# Patient Record
Sex: Female | Born: 1965 | Hispanic: No | Marital: Single | Smoking: Never smoker
Health system: Southern US, Community
[De-identification: ages and names within clinical notes are randomized; demographics above are authoritative.]

## PROBLEM LIST (undated history)

## (undated) DIAGNOSIS — I10 Essential (primary) hypertension: Secondary | ICD-10-CM

## (undated) DIAGNOSIS — E119 Type 2 diabetes mellitus without complications: Secondary | ICD-10-CM

## (undated) DIAGNOSIS — E785 Hyperlipidemia, unspecified: Secondary | ICD-10-CM

## (undated) DIAGNOSIS — E790 Hyperuricemia without signs of inflammatory arthritis and tophaceous disease: Secondary | ICD-10-CM

## (undated) HISTORY — DX: Hyperuricemia without signs of inflammatory arthritis and tophaceous disease: E79.0

## (undated) HISTORY — PX: OTHER SURGICAL HISTORY: SHX169

## (undated) HISTORY — DX: Hyperlipidemia, unspecified: E78.5

---

## 2004-12-01 HISTORY — PX: BONE MARROW BIOPSY: SHX199

## 2005-02-25 ENCOUNTER — Encounter: Payer: Self-pay | Admitting: Internal Medicine

## 2007-03-01 ENCOUNTER — Inpatient Hospital Stay (HOSPITAL_COMMUNITY): Admission: AD | Admit: 2007-03-01 | Discharge: 2007-03-01 | Payer: Self-pay | Admitting: Obstetrics & Gynecology

## 2007-03-15 ENCOUNTER — Encounter: Admission: RE | Admit: 2007-03-15 | Discharge: 2007-03-15 | Payer: Self-pay | Admitting: Obstetrics and Gynecology

## 2007-03-18 ENCOUNTER — Ambulatory Visit: Payer: Self-pay | Admitting: Internal Medicine

## 2007-08-10 ENCOUNTER — Ambulatory Visit: Payer: Self-pay | Admitting: Internal Medicine

## 2007-08-19 ENCOUNTER — Encounter (INDEPENDENT_AMBULATORY_CARE_PROVIDER_SITE_OTHER): Payer: Self-pay | Admitting: *Deleted

## 2007-08-26 ENCOUNTER — Ambulatory Visit: Payer: Self-pay | Admitting: Internal Medicine

## 2007-08-26 LAB — CONVERTED CEMR LAB
Cholesterol, target level: 200 mg/dL
HDL goal, serum: 40 mg/dL
LDL Goal: 160 mg/dL

## 2008-01-05 ENCOUNTER — Ambulatory Visit: Payer: Self-pay | Admitting: Internal Medicine

## 2008-01-19 ENCOUNTER — Encounter: Payer: Self-pay | Admitting: Internal Medicine

## 2008-02-28 ENCOUNTER — Ambulatory Visit: Payer: Self-pay | Admitting: Internal Medicine

## 2008-03-05 LAB — CONVERTED CEMR LAB
Cholesterol: 208 mg/dL (ref 0–200)
Direct LDL: 153.9 mg/dL
HDL: 32.9 mg/dL — ABNORMAL LOW (ref >39.0)
Hgb A1c MFr Bld: 5.9 % (ref 4.6–6.0)
Total CHOL/HDL Ratio: 6.3
Triglycerides: 193 mg/dL — ABNORMAL HIGH (ref 0–149)
VLDL: 39 mg/dL (ref 0–40)

## 2008-03-06 ENCOUNTER — Encounter (INDEPENDENT_AMBULATORY_CARE_PROVIDER_SITE_OTHER): Payer: Self-pay | Admitting: *Deleted

## 2008-03-06 ENCOUNTER — Ambulatory Visit: Payer: Self-pay | Admitting: Internal Medicine

## 2008-03-06 DIAGNOSIS — E1169 Type 2 diabetes mellitus with other specified complication: Secondary | ICD-10-CM | POA: Insufficient documentation

## 2008-03-06 DIAGNOSIS — E782 Mixed hyperlipidemia: Secondary | ICD-10-CM | POA: Insufficient documentation

## 2008-03-06 LAB — CONVERTED CEMR LAB: LDL Goal: 130 mg/dL

## 2008-04-19 ENCOUNTER — Encounter: Payer: Self-pay | Admitting: Internal Medicine

## 2008-06-05 ENCOUNTER — Ambulatory Visit: Payer: Self-pay | Admitting: Internal Medicine

## 2008-06-05 DIAGNOSIS — T887XXA Unspecified adverse effect of drug or medicament, initial encounter: Secondary | ICD-10-CM | POA: Insufficient documentation

## 2008-06-05 LAB — CONVERTED CEMR LAB
ALT: 19 units/L (ref 0–35)
AST: 22 units/L (ref 0–37)
Albumin: 3.8 g/dL (ref 3.5–5.2)
Alkaline Phosphatase: 66 units/L (ref 39–117)
Bilirubin, Direct: 0.1 mg/dL (ref 0.0–0.3)
Cholesterol: 207 mg/dL (ref 0–200)
Direct LDL: 145.6 mg/dL
Free T4: 1 ng/dL (ref 0.6–1.6)
HDL: 31.8 mg/dL — ABNORMAL LOW (ref >39.0)
TSH: 2.07 microintl units/mL (ref 0.35–5.50)
Total Bilirubin: 0.7 mg/dL (ref 0.3–1.2)
Total CHOL/HDL Ratio: 6.5
Total Protein: 7.7 g/dL (ref 6.0–8.3)
Triglycerides: 241 mg/dL (ref 0–149)
VLDL: 48 mg/dL — ABNORMAL HIGH (ref 0–40)

## 2008-06-08 ENCOUNTER — Ambulatory Visit: Payer: Self-pay | Admitting: Internal Medicine

## 2008-06-08 LAB — CONVERTED CEMR LAB
Cholesterol, target level: 200 mg/dL
HDL goal, serum: 50 mg/dL
LDL Goal: 100 mg/dL

## 2008-09-11 ENCOUNTER — Ambulatory Visit: Payer: Self-pay | Admitting: Internal Medicine

## 2008-09-14 ENCOUNTER — Encounter (INDEPENDENT_AMBULATORY_CARE_PROVIDER_SITE_OTHER): Payer: Self-pay | Admitting: *Deleted

## 2008-09-14 LAB — CONVERTED CEMR LAB
ALT: 19 units/L (ref 0–35)
AST: 21 units/L (ref 0–37)
Albumin: 3.8 g/dL (ref 3.5–5.2)
Alkaline Phosphatase: 65 units/L (ref 39–117)
Bilirubin, Direct: 0.1 mg/dL (ref 0.0–0.3)
Cholesterol: 228 mg/dL (ref 0–200)
Direct LDL: 144.5 mg/dL
HDL: 35.5 mg/dL — ABNORMAL LOW (ref >39.0)
Total Bilirubin: 0.8 mg/dL (ref 0.3–1.2)
Total CHOL/HDL Ratio: 6.4
Total Protein: 8 g/dL (ref 6.0–8.3)
Triglycerides: 223 mg/dL (ref 0–149)
VLDL: 45 mg/dL — ABNORMAL HIGH (ref 0–40)

## 2008-09-18 ENCOUNTER — Ambulatory Visit: Payer: Self-pay | Admitting: Internal Medicine

## 2008-09-18 LAB — CONVERTED CEMR LAB
Cholesterol, target level: 200 mg/dL
HDL goal, serum: 50 mg/dL
LDL Goal: 100 mg/dL

## 2008-11-07 ENCOUNTER — Encounter: Payer: Self-pay | Admitting: Internal Medicine

## 2008-11-07 ENCOUNTER — Encounter: Admission: RE | Admit: 2008-11-07 | Discharge: 2008-11-07 | Payer: Self-pay | Admitting: Internal Medicine

## 2008-12-18 ENCOUNTER — Ambulatory Visit: Payer: Self-pay | Admitting: Internal Medicine

## 2008-12-24 LAB — CONVERTED CEMR LAB
ALT: 23 units/L (ref 0–35)
AST: 20 units/L (ref 0–37)
Albumin: 3.9 g/dL (ref 3.5–5.2)
Alkaline Phosphatase: 64 units/L (ref 39–117)
Bilirubin, Direct: 0.1 mg/dL (ref 0.0–0.3)
Cholesterol: 155 mg/dL (ref 0–200)
HDL: 33.4 mg/dL — ABNORMAL LOW (ref >39.0)
Hgb A1c MFr Bld: 5.8 % (ref 4.6–6.0)
LDL Cholesterol: 91 mg/dL (ref 0–99)
Total Bilirubin: 0.6 mg/dL (ref 0.3–1.2)
Total CHOL/HDL Ratio: 4.6
Total Protein: 7.5 g/dL (ref 6.0–8.3)
Triglycerides: 152 mg/dL — ABNORMAL HIGH (ref 0–149)
VLDL: 30 mg/dL (ref 0–40)

## 2008-12-25 ENCOUNTER — Ambulatory Visit: Payer: Self-pay | Admitting: Internal Medicine

## 2008-12-25 LAB — CONVERTED CEMR LAB
Cholesterol, target level: 200 mg/dL
HDL goal, serum: 40 mg/dL
LDL Goal: 100 mg/dL

## 2008-12-29 LAB — CONVERTED CEMR LAB
Free T4: 1 ng/dL (ref 0.6–1.6)
TSH: 1.78 microintl units/mL (ref 0.35–5.50)

## 2009-01-01 ENCOUNTER — Encounter: Payer: Self-pay | Admitting: Internal Medicine

## 2009-01-09 ENCOUNTER — Encounter: Payer: Self-pay | Admitting: Internal Medicine

## 2009-05-10 ENCOUNTER — Encounter: Admission: RE | Admit: 2009-05-10 | Discharge: 2009-05-10 | Payer: Self-pay | Admitting: Obstetrics and Gynecology

## 2009-05-16 ENCOUNTER — Encounter: Admission: RE | Admit: 2009-05-16 | Discharge: 2009-05-16 | Payer: Self-pay | Admitting: Obstetrics and Gynecology

## 2009-06-19 ENCOUNTER — Ambulatory Visit: Payer: Self-pay | Admitting: Internal Medicine

## 2009-06-22 LAB — CONVERTED CEMR LAB
Rhuematoid fact SerPl-aCnc: <20 intl units/mL (ref 0.0–20.0)
Sed Rate: 25 mm/hr — ABNORMAL HIGH (ref 0–22)
Total CK: 100 units/L (ref 7–177)
Uric Acid, Serum: 7.1 mg/dL — ABNORMAL HIGH (ref 2.4–7.0)
Vit D, 25-Hydroxy: 16 ng/mL — ABNORMAL LOW (ref 30–89)

## 2009-06-26 ENCOUNTER — Encounter (INDEPENDENT_AMBULATORY_CARE_PROVIDER_SITE_OTHER): Payer: Self-pay | Admitting: *Deleted

## 2009-10-30 ENCOUNTER — Ambulatory Visit: Payer: Self-pay | Admitting: Internal Medicine

## 2009-11-07 ENCOUNTER — Encounter (INDEPENDENT_AMBULATORY_CARE_PROVIDER_SITE_OTHER): Payer: Self-pay | Admitting: *Deleted

## 2009-11-07 LAB — CONVERTED CEMR LAB: Vit D, 25-Hydroxy: 30 ng/mL (ref 30–89)

## 2009-12-24 ENCOUNTER — Ambulatory Visit: Payer: Self-pay | Admitting: Internal Medicine

## 2009-12-24 LAB — CONVERTED CEMR LAB
ALT: 23 units/L (ref 0–35)
AST: 24 units/L (ref 0–37)
Albumin: 4.1 g/dL (ref 3.5–5.2)
Alkaline Phosphatase: 63 units/L (ref 39–117)
BUN: 9 mg/dL (ref 6–23)
Basophils Absolute: 0.1 10*3/uL (ref 0.0–0.1)
Basophils Relative: 0.8 % (ref 0.0–3.0)
Bilirubin Urine: NEGATIVE
Bilirubin, Direct: 0.1 mg/dL (ref 0.0–0.3)
Blood in Urine, dipstick: NEGATIVE
CO2: 26 meq/L (ref 19–32)
Calcium: 9.1 mg/dL (ref 8.4–10.5)
Chloride: 104 meq/L (ref 96–112)
Cholesterol: 215 mg/dL — ABNORMAL HIGH (ref 0–200)
Creatinine, Ser: 0.7 mg/dL (ref 0.4–1.2)
Direct LDL: 159.7 mg/dL
Eosinophils Absolute: 0.2 10*3/uL (ref 0.0–0.7)
Eosinophils Relative: 2.1 % (ref 0.0–5.0)
GFR calc non Af Amer: 96.18 mL/min (ref >60)
Glucose, Bld: 90 mg/dL (ref 70–99)
Glucose, Urine, Semiquant: NEGATIVE
HCT: 43 % (ref 36.0–46.0)
HDL: 38.2 mg/dL — ABNORMAL LOW (ref >39.00)
Hemoglobin: 14.2 g/dL (ref 12.0–15.0)
Ketones, urine, test strip: NEGATIVE
Lymphocytes Relative: 30.6 % (ref 12.0–46.0)
Lymphs Abs: 3.1 10*3/uL (ref 0.7–4.0)
MCHC: 32.9 g/dL (ref 30.0–36.0)
MCV: 89.3 fL (ref 78.0–100.0)
Monocytes Absolute: 0.5 10*3/uL (ref 0.1–1.0)
Monocytes Relative: 4.8 % (ref 3.0–12.0)
Neutro Abs: 6.1 10*3/uL (ref 1.4–7.7)
Neutrophils Relative %: 61.7 % (ref 43.0–77.0)
Nitrite: NEGATIVE
Platelets: 336 10*3/uL (ref 150.0–400.0)
Potassium: 4.2 meq/L (ref 3.5–5.1)
Protein, U semiquant: NEGATIVE
RBC: 4.81 M/uL (ref 3.87–5.11)
RDW: 12.1 % (ref 11.5–14.6)
Sodium: 139 meq/L (ref 135–145)
Specific Gravity, Urine: <1.005
TSH: 1.73 microintl units/mL (ref 0.35–5.50)
Total Bilirubin: 0.8 mg/dL (ref 0.3–1.2)
Total CHOL/HDL Ratio: 6
Total Protein: 7.8 g/dL (ref 6.0–8.3)
Triglycerides: 248 mg/dL — ABNORMAL HIGH (ref 0.0–149.0)
Urobilinogen, UA: 0.2
VLDL: 49.6 mg/dL — ABNORMAL HIGH (ref 0.0–40.0)
WBC Urine, dipstick: NEGATIVE
WBC: 10 10*3/uL (ref 4.5–10.5)
pH: 7

## 2009-12-27 ENCOUNTER — Ambulatory Visit: Payer: Self-pay | Admitting: Internal Medicine

## 2010-05-14 ENCOUNTER — Encounter: Admission: RE | Admit: 2010-05-14 | Discharge: 2010-05-14 | Payer: Self-pay | Admitting: Obstetrics and Gynecology

## 2010-06-26 ENCOUNTER — Ambulatory Visit: Payer: Self-pay | Admitting: Internal Medicine

## 2010-06-27 ENCOUNTER — Encounter: Payer: Self-pay | Admitting: Internal Medicine

## 2010-10-22 ENCOUNTER — Ambulatory Visit: Payer: Self-pay | Admitting: Internal Medicine

## 2010-10-22 DIAGNOSIS — J019 Acute sinusitis, unspecified: Secondary | ICD-10-CM | POA: Insufficient documentation

## 2010-12-30 ENCOUNTER — Ambulatory Visit
Admission: RE | Admit: 2010-12-30 | Discharge: 2010-12-30 | Payer: Self-pay | Source: Home / Self Care | Attending: Internal Medicine | Admitting: Internal Medicine

## 2010-12-30 ENCOUNTER — Other Ambulatory Visit: Payer: Self-pay | Admitting: Internal Medicine

## 2010-12-30 LAB — LIPID PANEL
Cholesterol: 206 mg/dL — ABNORMAL HIGH (ref 0–200)
HDL: 37.4 mg/dL — ABNORMAL LOW (ref >39.00)
Total CHOL/HDL Ratio: 6
Triglycerides: 174 mg/dL — ABNORMAL HIGH (ref 0.0–149.0)
VLDL: 34.8 mg/dL (ref 0.0–40.0)

## 2010-12-30 LAB — HEPATIC FUNCTION PANEL
ALT: 20 U/L (ref 0–35)
AST: 19 U/L (ref 0–37)
Albumin: 3.8 g/dL (ref 3.5–5.2)
Alkaline Phosphatase: 63 U/L (ref 39–117)
Bilirubin, Direct: 0 mg/dL (ref 0.0–0.3)
Total Bilirubin: 0.5 mg/dL (ref 0.3–1.2)
Total Protein: 6.9 g/dL (ref 6.0–8.3)

## 2010-12-30 LAB — LDL CHOLESTEROL, DIRECT: Direct LDL: 154.7 mg/dL

## 2010-12-31 NOTE — Letter (Signed)
Summary: Results Follow up Letter  [place] at [facility]  [address] [place], [place] [address]   Phone: [phone]  Fax: [phone]    06/26/2009 MRN: [id number]  Savannah Rogers [address] [place], [place]  [address]  Dear Ms. Maret,  The following are the results of your recent test(s):  Test         Result    Pap Smear:        Normal _____  Not Normal _____ Comments: ______________________________________________________ Cholesterol: LDL(Bad cholesterol):         Your goal is less than:         HDL (Good cholesterol):       Your goal is more than: Comments:  ______________________________________________________ Mammogram:        Normal _____  Not Normal _____ Comments:  ___________________________________________________________________ Hemoccult:        Normal _____  Not normal _______ Comments:    _____________________________________________________________________ Other Tests: PLEASE SEE ATTACHED LABS DONE ON 06/19/2009 AND RX. ATTACHED IS ALSO AN APPOINTMENT CARD TO RECHECK IN 4 MONTHS    We routinely do not discuss normal results over the telephone.  If you desire a copy of the results, or you have any questions about this information we can discuss them at your next office visit.   Sincerely,

## 2010-12-31 NOTE — Letter (Signed)
Summary: Results Follow up Letter  [place] at [facility]  [address] [place], [place] [address]   Phone: [phone]  Fax: [phone]    01/01/2009 MRN: [id number]  Savannah Rogers [address] [place], [place]  [address]  Dear Ms. Bergevin,  The following are the results of your recent test(s):  Test         Result    Pap Smear:        Normal _____  Not Normal _____ Comments: ______________________________________________________ Cholesterol: LDL(Bad cholesterol):         Your goal is less than:         HDL (Good cholesterol):       Your goal is more than: Comments:  ______________________________________________________ Mammogram:        Normal _____  Not Normal _____ Comments:  ___________________________________________________________________ Hemoccult:        Normal _____  Not normal _______ Comments:    _____________________________________________________________________ Other Tests: PLEASE SEE ATTACHED LABS DONE ON 12/25/2008    We routinely do not discuss normal results over the telephone.  If you desire a copy of the results, or you have any questions about this information we can discuss them at your next office visit.   Sincerely,

## 2010-12-31 NOTE — Assessment & Plan Note (Signed)
Summary: roa 4 months review  lab/cbs   Vital Signs:  Patient Profile:   45 Years Old Female Weight:      147.8 pounds Pulse rate:   72 / minute BP sitting:   122 / 84  (left arm) Cuff size:   large  Vitals Entered By: Shonna Chock (March 06, 2008 3:20 PM)                 Chief Complaint:  FOLLOW-UP and DISCUSS LABS.  History of Present Illness: Lipids & NMR reviewed ; LDL > 100 is only risk . Risk is > 20% (goal = < 10%,ideally < 5%0. She decreased bread intake; CVE once daily for 1 hour  w/o symptoms.  Lipid Management History:      Positive NCEP/ATP III risk factors include HDL cholesterol less than 40 and family history for ischemic heart disease (females less than 6 years old).  Negative NCEP/ATP III risk factors include female age less than 60 years old, non-diabetic, non-tobacco-user status, non-hypertensive, no ASHD (atherosclerotic heart disease), no prior stroke/TIA, no peripheral vascular disease, and no history of aortic aneurysm.       Current Allergies: No known allergies      Review of Systems  CV      Denies chest pain or discomfort, leg cramps with exertion, and shortness of breath with exertion.   Physical Exam  General:     Well-developed,well-nourished,in no acute distress; alert,appropriate and cooperative throughout examination Heart:     Normal rate and regular rhythm. S1 and S2 normal without gallop, murmur, click, rub or other extra sounds. Pulses:     R and L carotid,radial,dorsalis pedis and posterior tibial pulses are full and equal bilaterally Psych:     Focused & motivated    Impression & Recommendations:  Problem # 1:  HYPERLIPIDEMIA (ICD-272.4)  Her updated medication list for this problem includes:    Pravastatin Sodium 40 Mg Tabs (Pravastatin sodium) .Marland Kitchen... 1 qhs   Complete Medication List: 1)  Claritin 10 Mg Tabs (Loratadine) .Marland Kitchen.. 1 once daily as needed allergies 2)  Pravastatin Sodium 40 Mg Tabs (Pravastatin sodium)  .Marland Kitchen.. 1 qhs  Lipid Assessment/Plan:      Based on NCEP/ATP III, the patient's risk factor category is "2 or more risk factors and a calculated 10 year CAD risk of < 20%".  From this information, the patient's calculated lipid goals are as follows: Total cholesterol goal is 200; LDL cholesterol goal is 130; HDL cholesterol goal is 40; Triglyceride goal is 150.  Her LDL cholesterol goal has not been met.  Secondary causes for hyperlipidemia have been ruled out.  She has been counseled on adjunctive measures for lowering her cholesterol and has been provided with dietary instructions.     Patient Instructions: 1)  Please schedule a follow-up appointment in 3 months. 2)  Hepatic Panel prior to visit, ICD-9: 995.20 3)  Lipid Panel prior to visit, ICD-9:272.4. Your LDL goal = < 100 (NOT < 130)    Prescriptions: PRAVASTATIN SODIUM 40 MG  TABS (PRAVASTATIN SODIUM) 1 qhs  #90 x 0   Entered and Authorized by:   Marga Melnick MD   Signed by:   Marga Melnick MD on 03/06/2008   Method used:   Print then Give to Patient   RxID:   [id number]  ]

## 2010-12-31 NOTE — Assessment & Plan Note (Signed)
Summary: to discuss the letter she got   ph   Vital Signs:  Patient Profile:   45 Years Old Female Weight:      142 pounds Pulse rate:   72 / minute Pulse rhythm:   regular BP sitting:   142 / 80  (left arm) Cuff size:   large  Pt. in pain?   no  Vitals Entered By: Wendall Stade (August 26, 2007 12:45 PM)                  Chief Complaint:  follow up labs.  History of Present Illness: NMR revealed LDL 156 with 2239 total # & 1292 small dense # for a risk of > 20%. LDL goal = < 100. Exercising treadmill & body exercises 45 min 3X/week without cardiopulmonary symptoms. No specific diet.  Lipid Management History:      Positive NCEP/ATP III risk factors include family history for ischemic heart disease (females less than 25 years old).  Negative NCEP/ATP III risk factors include female age less than 43 years old, non-diabetic, non-tobacco-user status, non-hypertensive, no ASHD (atherosclerotic heart disease), no prior stroke/TIA, no peripheral vascular disease, and no history of aortic aneurysm.     Current Allergies (reviewed today): No known allergies  Updated/Current Medications (including changes made in today's visit):  None     Risk Factors:  Tobacco use:  never  Family History Risk Factors:    Family History of MI in females < 79 years old:  yes    Family History of MI in males < 84 years old:  no    Physical Exam  General:     Well-developed,well-nourished,in no acute distress; alert,appropriate and cooperative throughout examination Heart:     Normal rate and regular rhythm. S1 and S2 normal without gallop, murmur, click, rub or other extra sounds. Pulses:     R and L carotid,radial,dorsalis pedis and posterior tibial pulses are full and equal bilaterally Psych:     Intelligent & focused    Impression & Recommendations:  Problem # 1:  HYPERLIPIDEMIA (ICD-272.2)  Lipid Assessment/Plan:      Based on NCEP/ATP III, the patient's risk factor  category is "0-1 risk factors".  From this information, the patient's calculated lipid goals are as follows: Total cholesterol goal is 200; LDL cholesterol goal is 160; HDL cholesterol goal is 40; Triglyceride goal is 150.  Her LDL cholesterol goal has not been met.  Secondary causes for hyperlipidemia have been ruled out.  She has been counseled on adjunctive measures for lowering her cholesterol and has been provided with dietary instructions.     Patient Instructions: 1)  Please schedule a follow-up appointment in 4 months. Low carb diet of choice. 2)  Lipid Panel prior to visit, ICD-9: 272.4 3)  HbgA1C prior to visit, ICD-9: 277.7. BP goal = average < 130/85.    ]

## 2010-12-31 NOTE — Assessment & Plan Note (Signed)
Summary: review labs   Vital Signs:  Patient Profile:   45 Years Old Female Weight:      147.6 pounds Pulse rate:   84 / minute Resp:     14 per minute BP sitting:   118 / 72  (left arm) Cuff size:   large  Vitals Entered By: Shonna Chock (September 18, 2008 3:20 PM)                 Chief Complaint:  FOLLOW-UP ON LABS AND FLU VACCINE.  History of Present Illness: Compared to 06/05/08 TG minimally better; LDL unchanged. CVE as 45 min treadmill 3X/week w/o symptoms. Diet has increased rice & bread.  Lipid Management History:      Positive NCEP/ATP III risk factors include HDL cholesterol less than 40.  Negative NCEP/ATP III risk factors include female age less than 4 years old, non-diabetic, no family history for ischemic heart disease, non-tobacco-user status, non-hypertensive, no ASHD (atherosclerotic heart disease), no prior stroke/TIA, no peripheral vascular disease, and no history of aortic aneurysm.       Current Allergies (reviewed today): No known allergies      Review of Systems  CV      Denies chest pain or discomfort, leg cramps with exertion, and shortness of breath with exertion.   Physical Exam  General:     well-nourished,in no acute distress; alert,appropriate and cooperative throughout examination Heart:     normal rate, regular rhythm, no gallop, no rub, no JVD, and grade 1/2 /6 systolic murmur.   Pulses:     R and L carotid,radial,dorsalis pedis and posterior tibial pulses are full and equal bilaterally    Impression & Recommendations:  Problem # 1:  HYPERLIPIDEMIA (ICD-272.4)  Orders: Nutrition Referral (Nutrition)   Complete Medication List: 1)  Claritin 10 Mg Tabs (Loratadine) .Marland Kitchen.. 1 once daily as needed allergies 2)  Crestor 20 Mg Tabs (rosuvastatin Calcium)  .Marland Kitchen.. 1 mon,weds & fri 3)  Singulair 10 Mg Tabs (Montelukast sodium) .Marland Kitchen.. 1 once daily for cough  Lipid Assessment/Plan:      Based on NCEP/ATP III, the patient's risk factor  category is "0-1 risk factors".  The patient's lipid goals have been set as follows: Total cholesterol goal is 200; LDL cholesterol goal is 100; HDL cholesterol goal is 50; Triglyceride goal is 150.  Her LDL cholesterol goal has not been met.  Secondary causes for hyperlipidemia have been ruled out.  She has been counseled on adjunctive measures for lowering her cholesterol and has been provided with dietary instructions.     Patient Instructions: 1)  Read The Sugar Busters for information  on glycemic index of foods. 2)  Please schedule a follow-up appointment in 3 months. 3)  Hepatic Panel prior to visit, ICD-9: 995.20 4)  Lipid Panel prior to visit, ICD-9:272.4 5)  HbgA1C prior to visit, ICD-9:790.6   Prescriptions: CRESTOR 20 MG  TABS (ROSUVASTATIN CALCIUM) 1 Mon,Weds & Fri  #30 x 5   Entered and Authorized by:   Marga Melnick MD   Signed by:   Marga Melnick MD on 09/18/2008   Method used:   Print then Give to Patient   RxID:   [id number]  ]

## 2010-12-31 NOTE — Letter (Signed)
Summary: Results Follow up Letter  [place] at [facility]  [address] [place], [place] [address]   Phone: [phone]  Fax: [phone]    03/06/2008 MRN: [id number]  Savannah Rogers [address] [place], [place]  [address]  Dear Ms. Searcy,  The following are the results of your recent test(s):  Test         Result    Pap Smear:        Normal _____  Not Normal _____ Comments: ______________________________________________________ Cholesterol: LDL(Bad cholesterol):         Your goal is less than:         HDL (Good cholesterol):       Your goal is more than: Comments:  ______________________________________________________ Mammogram:        Normal _____  Not Normal _____ Comments:  ___________________________________________________________________ Hemoccult:        Normal _____  Not normal _______ Comments:    _____________________________________________________________________ Other Tests:  Please see attached results and comments   We routinely do not discuss normal results over the telephone.  If you desire a copy of the results, or you have any questions about this information we can discuss them at your next office visit.   Sincerely,

## 2010-12-31 NOTE — Assessment & Plan Note (Signed)
Summary: ACUTE LEGS HURT KNEES SWOLLEN/ALR   Vital Signs:  Patient profile:   45 year old female Weight:      148.2 pounds Temp:     98.1 degrees F oral Pulse rate:   72 / minute Resp:     14 per minute BP sitting:   106 / 68  (left arm) Cuff size:   regular  Vitals Entered By: Shonna Chock (June 19, 2009 1:55 PM) CC: SWELLING: KNEES AND LEGS HURT X 1 WEEK Comments REVIEWED MED LIST, PATIENT AGREED DOSE AND INSTRUCTION CORRECT    CC:  SWELLING: KNEES AND LEGS HURT X 1 WEEK.  History of Present Illness: Onset 06/14/09 as pain & swelling in knees & lower legs ( ankles & feet). Pain is constant  in joints from knees down. Today pain R wrist. Legs "heavy"; no Rx to date. On Crestor 20 mg 3X/week. Father has  , PGF have had arthritis.  Allergies (verified): No Known Drug Allergies  Review of Systems General:  Complains of fever; denies chills, sweats, and weight loss. MS:  See HPI; Denies low back pain, mid back pain, muscle aches, cramps, muscle weakness, and thoracic pain. Derm:  Denies lesion(s) and rash.  Physical Exam  General:  well-nourished,in no acute distress; alert,appropriate and cooperative throughout examination Extremities:  No clubbing, cyanosis, edema, or deformity noted with normal full range of motion of all joints.  Crepitus of knees; neg Homan's  Skin:  Intact without suspicious lesions or rashes Cervical Nodes:  No lymphadenopathy noted Axillary Nodes:  No palpable lymphadenopathy   Impression & Recommendations:  Problem # 1:  ARTHRALGIA (ICD-719.40)  Statin therapy  Orders: Venipuncture (60454) TLB-Uric Acid, Blood (84550-URIC) TLB-Sedimentation Rate (ESR) (85652-ESR) TLB-Rheumatoid Factor (RA) (09811-BJ) T-Vitamin D (25-Hydroxy) (47829-56213)  Problem # 2:  MYALGIA (ICD-729.1)  "legs heavy"  Her updated medication list for this problem includes:    Tramadol Hcl 50 Mg Tabs (Tramadol hcl) .Marland Kitchen... 1 q 6 hrs as needed pain  Problem # 3:  UNS  ADVRS EFF UNS RX MEDICINAL&BIOLOGICAL SBSTNC (ICD-995.20) R/O statin adverse effect ( doubt due to dose) Orders: Venipuncture (08657) TLB-CK Total Only(Creatine Kinase/CPK) (82550-CK)  Complete Medication List: 1)  Crestor 20 Mg Tabs (rosuvastatin Calcium)  .Marland Kitchen.. 1 mon,weds & fri 2)  Tramadol Hcl 50 Mg Tabs (Tramadol hcl) .Marland Kitchen.. 1 q 6 hrs as needed pain  Patient Instructions: 1)  Pain Rx as directed. Prescriptions: TRAMADOL HCL 50 MG TABS (TRAMADOL HCL) 1 q 6 hrs as needed pain  #30 x 1   Entered and Authorized by:   Marga Melnick MD   Signed by:   Marga Melnick MD on 06/19/2009   Method used:   Print then Give to Patient   RxID:   [id number]

## 2010-12-31 NOTE — Letter (Signed)
Summary: [facility]  [facility]   Imported By: Lanelle Bal 11/15/2008 12:16:21  _____________________________________________________________________  External Attachment:    Type:   Image     Comment:   External Document

## 2010-12-31 NOTE — Assessment & Plan Note (Signed)
Summary: roa 3 months.cbs   Vital Signs:  Patient Profile:   45 Years Old Female Weight:      149.2 pounds Pulse rate:   72 / minute Resp:     15 per minute BP sitting:   114 / 68  (left arm) Cuff size:   regular  Vitals Entered By: Shonna Chock (December 25, 2008 3:35 PM)                 Chief Complaint:  FOLLOW-UP (COPY OF LABS GIVEN).  History of Present Illness: Serial labs reviewed; DRAMATIC improvement . LDL down from 145.6 to 91 with CVE (treadmill 45 min 3-5X/week), increased Flax in diet & Crestor 20 mg M,W & F.  Lipid Management History:      Positive NCEP/ATP III risk factors include HDL cholesterol less than 40.  Negative NCEP/ATP III risk factors include female age less than 67 years old, non-diabetic, no family history for ischemic heart disease, non-tobacco-user status, non-hypertensive, no ASHD (atherosclerotic heart disease), no prior stroke/TIA, no peripheral vascular disease, and no history of aortic aneurysm.       Current Allergies: No known allergies      Review of Systems  CV      Denies chest pain or discomfort, leg cramps with exertion, palpitations, and shortness of breath with exertion.  Endo      Complains of weight change.      6 # gain in 1 yr; thyroid check  requested   Physical Exam  General:     well-nourished,in no acute distress; alert,appropriate and cooperative throughout examination Neck:     No deformities, masses, or tenderness noted. Heart:     Normal rate and regular rhythm. S1 and S2 normal without gallop, murmur, click, rub or other extra sounds. Pulses:     R and L carotid,radialdorsalis pedis and posterior tibial pulses are full and equal bilaterally Extremities:     No clubbing, cyanosis, edema Neurologic:     alert & oriented X3 and DTRs symmetrical and normal.   Psych:     memory intact for recent and remote, normally interactive, and good eye contact.   Focused & motivated    Impression &  Recommendations:  Problem # 1:  HYPERLIPIDEMIA (ICD-272.4)  Problem # 2:  WEIGHT GAIN (ICD-783.1)  Orders: Venipuncture (04540) TLB-TSH (Thyroid Stimulating Hormone) (84443-TSH) TLB-T4 (Thyrox), Free [phone])   Complete Medication List: 1)  Crestor 20 Mg Tabs (rosuvastatin Calcium)  .Marland Kitchen.. 1 mon,weds & fri  Lipid Assessment/Plan:      Based on NCEP/ATP III, the patient's risk factor category is "0-1 risk factors".  The patient's lipid goals have been set as follows: Total cholesterol goal is 200; LDL cholesterol goal is 100; HDL cholesterol goal is 40; Triglyceride goal is 150.  Her LDL cholesterol goal has been met.  Secondary causes for hyperlipidemia have been ruled out.  She has been counseled on adjunctive measures for lowering her cholesterol and has been provided with dietary instructions.     Patient Instructions: 1)  Please schedule a follow-up appointment in 1 year OR as needed . WONDERFUL JOB !   Prescriptions: CRESTOR 20 MG  TABS (ROSUVASTATIN CALCIUM) 1 Mon,Weds & Fri  #30 x 5   Entered and Authorized by:   Marga Melnick MD   Signed by:   Marga Melnick MD on 12/25/2008   Method used:   Print then Give to Patient   RxID:   [id number]

## 2010-12-31 NOTE — Letter (Signed)
Summary: Results Follow up Letter  [place] at [facility]  [address] [place], [place] [address]   Phone: [phone]  Fax: [phone]    09/14/2008 MRN: [id number]  Savannah Rogers [address] [place], [place]  [address]  Dear Ms. Denis,  The following are the results of your recent test(s):  Test         Result    Pap Smear:        Normal _____  Not Normal _____ Comments: ______________________________________________________ Cholesterol: LDL(Bad cholesterol):         Your goal is less than:         HDL (Good cholesterol):       Your goal is more than: Comments:  ______________________________________________________ Mammogram:        Normal _____  Not Normal _____ Comments:  ___________________________________________________________________ Hemoccult:        Normal _____  Not normal _______ Comments:    _____________________________________________________________________ Other Tests: PLEASE SEE ATTACHED LABS FROM 09/11/08 AND COMMENTS    We routinely do not discuss normal results over the telephone.  If you desire a copy of the results, or you have any questions about this information we can discuss them at your next office visit.   Sincerely,

## 2010-12-31 NOTE — Letter (Signed)
[facility]   Imported By: Freddy Jaksch 04/20/2008 12:00:03  _____________________________________________________________________  External Attachment:    Type:   Image     Comment:   External Document  Appended Document: [facility] Lelon Perla ,[facility]: intermittent thrombocytosis & leukocytosis; no serious pathology questioned

## 2010-12-31 NOTE — Consult Note (Signed)
[facility]   Imported By: Freddy Jaksch 01/26/2008 15:42:08  _____________________________________________________________________  External Attachment:    Type:   Image     Comment:   External Document

## 2010-12-31 NOTE — Letter (Signed)
Summary: [facility] Show for Appt[facility]  No Show for Appt./[facility]   Imported By: Lanelle Bal 01/15/2009 14:38:12  _____________________________________________________________________  External Attachment:    Type:   Image     Comment:   External Document

## 2010-12-31 NOTE — Letter (Signed)
Summary: Results Follow up Letter  [place] at [facility]  [address] [place], [place] [address]   Phone: [phone]  Fax: [phone]    11/07/2009 MRN: [id number]  Savannah Rogers [address] [place], [place]  [address]  Dear Savannah Rogers,  The following are the results of your recent test(s):  Test         Result    Pap Smear:        Normal _____  Not Normal _____ Comments: ______________________________________________________ Cholesterol: LDL(Bad cholesterol):         Your goal is less than:         HDL (Good cholesterol):       Your goal is more than: Comments:  ______________________________________________________ Mammogram:        Normal _____  Not Normal _____ Comments:  ___________________________________________________________________ Hemoccult:        Normal _____  Not normal _______ Comments:    _____________________________________________________________________ Other Tests: Please see attached labs done on 10/30/2009, call to schedule follow-up lab appointment in 3 months    We routinely do not discuss normal results over the telephone.  If you desire a copy of the results, or you have any questions about this information we can discuss them at your next office visit.   Sincerely,

## 2010-12-31 NOTE — Assessment & Plan Note (Signed)
Summary: ROA ON LABS//VGJ   Vital Signs:  Patient Profile:   45 Years Old Female Weight:      146.4 pounds Pulse rate:   84 / minute Resp:     14 per minute BP sitting:   124 / 76  (left arm) Cuff size:   large  Pt. in pain?   no  Vitals Entered By: Shonna Chock (June 08, 2008 4:02 PM)                  Chief Complaint:  FOLLOW-UP ON LABS.  History of Present Illness: Labs reviewed; LDL 145.6 off Pravastatin  for 30 days due to joint pain R wrist, neck & R knee. "Regular " diet, mainly bread. CVE as treadmill 3X/ week for 45 min @ w/o symptoms.   Lipid Management History:      Positive NCEP/ATP III risk factors include HDL cholesterol less than 40.  Negative NCEP/ATP III risk factors include female age less than 13 years old, non-diabetic, no family history for ischemic heart disease, non-tobacco-user status, non-hypertensive, no ASHD (atherosclerotic heart disease), no prior stroke/TIA, no peripheral vascular disease, and no history of aortic aneurysm.       Current Allergies (reviewed today): No known allergies    Family History:    PGM & PGF  CVA; no FH MI    Review of Systems  CV      Denies chest pain or discomfort, leg cramps with exertion, and shortness of breath with exertion.   Physical Exam  General:     Well-developed,well-nourished,in no acute distress; alert,appropriate and cooperative throughout examination Heart:     Normal rate and regular rhythm. S1 and S2 normal without gallop, murmur, click, rub or other extra sounds. Pulses:     R and L carotid,radial,dorsalis pedis and posterior tibial pulses are full and equal bilaterally    Impression & Recommendations:  Problem # 1:  HYPERLIPIDEMIA (ICD-272.4)  The following medications were removed from the medication list:    Pravastatin Sodium 40 Mg Tabs (Pravastatin sodium) .Marland Kitchen... 1 qhs  Her updated medication list for this problem includes:    Crestor 10 Mg Tabs (Rosuvastatin calcium)  .Marland Kitchen... 1 mon,weds & fri   Problem # 2:  UNS ADVRS EFF UNS RX MEDICINAL&BIOLOGICAL SBSTNC (ICD-995.20) joint pain with pravastatin  Complete Medication List: 1)  Claritin 10 Mg Tabs (Loratadine) .Marland Kitchen.. 1 once daily as needed allergies 2)  Crestor 10 Mg Tabs (Rosuvastatin calcium) .Marland Kitchen.. 1 mon,weds & fri  Lipid Assessment/Plan:      Based on NCEP/ATP III, the patient's risk factor category is "0-1 risk factors".  The patient's lipid goals have been set as follows: Total cholesterol goal is 200; LDL cholesterol goal is 100; HDL cholesterol goal is 50; Triglyceride goal is 150.  Her LDL cholesterol goal has not been met.  Secondary causes for hyperlipidemia have been ruled out.  She has been counseled on adjunctive measures for lowering her cholesterol and has been provided with dietary instructions.     Patient Instructions: 1)  read [employer]. Your LDL goal = < 100. 2)  Please schedule a follow-up appointment in 3 months. 3)  Hepatic Panel, CPK prior to visit, ICD-9:995.20 4)  Lipid Panel prior to visit, ICD-9:272.4   Prescriptions: CRESTOR 10 MG  TABS (ROSUVASTATIN CALCIUM) 1 Mon,Weds & Fri  #30 x 0   Entered and Authorized by:   Marga Melnick MD   Signed by:   Marga Melnick  MD on 06/08/2008   Method used:   Print then Give to Patient   RxID:   [id number]  ]

## 2010-12-31 NOTE — Assessment & Plan Note (Signed)
Summary: sinus cold--acute only//tl   Vital Signs:  Patient Profile:   45 Years Old Female Weight:      146.4 pounds Temp:     97.0 degrees F oral Pulse rate:   76 / minute Resp:     15 per minute BP sitting:   118 / 72  (left arm) Cuff size:   large  Pt. in pain?   no  Vitals Entered By: Shonna Chock (January 05, 2008 11:37 AM)                  Chief Complaint:  SINUS COLD X COUPLE DAYS and URI symptoms.  History of Present Illness:  URI Symptoms; Rx: Advil      This is a 45 year old woman who presents with URI symptoms.  No facial pain.  The patient reports nasal congestion and clear nasal discharge, but denies purulent nasal discharge, sore throat, dry cough, productive cough, earache, and sick contacts.  Associated symptoms include fever, low-grade fever (<100.5 degrees), stiff neck, and use of an antipyretic.  The patient denies dyspnea, wheezing, rash, vomiting, diarrhea, and response to antipyretic.  The patient also reports itchy watery eyes, sneezing, muscle aches, and severe fatigue.  The patient denies itchy throat, response to antihistamine, and headache.  Risk factors for Strep sinusitis include double sickening and tender adenopathy.  The patient denies the following risk factors for Strep sinusitis: tooth pain and Strep exposure.    Current Allergies: No known allergies     Risk Factors:  Alcohol use:  no Exercise:  yes    Times per week:  3    Type:  CARDIO X 1 HOUR 3 X WEEKLY   Review of Systems  General      See HPI      Complains of chills.      Denies sweats.  GI      Complains of gas.  Derm      Complains of lesion(s).      Facial lesions X 4 months; Doxycycline from UC  in 10/08 of no benefit  Allergy      Complains of itching eyes, seasonal allergies, and sneezing.      as needed Benadryl   Physical Exam  General:     Well-developed,well-nourished,in no acute distress; alert,appropriate and cooperative throughout examination;  mildly uncomfortable-appearing.   Eyes:     No corneal or conjunctival inflammation noted. EOMI. Perrla.  Vision grossly normal. Ears:     TMs dull; increased waxin canals Nose:     Dislocation to L of septum; erythema R nare with deviation Mouth:     Oral mucosa and oropharynx without lesions or exudates.  Teeth in good repair. Lungs:     Normal respiratory effort, chest expands symmetrically. Lungs are clear to auscultation, no crackles or wheezes. Skin:     Pustular acne of face Cervical Nodes:     Shotty LA, L > R Axillary Nodes:     No palpable lymphadenopathy    Impression & Recommendations:  Problem # 1:  URI (ICD-465.9)  Her updated medication list for this problem includes:    Claritin 10 Mg Tabs (Loratadine) .Marland Kitchen... 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 1 two times a day   Complete Medication List: 1)  Smz-tmp  Ds 800-160 Mg Tabs (Sulfamethoxazole-trimethoprim) .Marland Kitchen.. 1 two times a day 2)  Claritin 10 Mg Tabs (Loratadine) .Marland Kitchen.. 1 once daily as needed allergies   Patient Instructions: 1)  Use Metamucil once daily as needed for  abdominal  "gas". Monitor for dietary triggers for gas ( Ex milk or dairy or wheat/ gluten) 2)  Drink as much fluid as you can tolerate for the next few days.Use a Neti pot as needed once daily for nasal congestion.    Prescriptions: CLARITIN 10 MG  TABS (LORATADINE) 1 once daily as needed allergies  #30 x 5   Entered and Authorized by:   Marga Melnick MD   Signed by:   Marga Melnick MD on 01/05/2008   Method used:   Print then Give to Patient   RxID:   [id number] SMZ-TMP DS 800-160 MG  TABS (SULFAMETHOXAZOLE-TRIMETHOPRIM) 1 two times a day  #14 x 0   Entered and Authorized by:   Marga Melnick MD   Signed by:    Marga Melnick MD on 01/05/2008   Method used:   Print then Give to Patient   RxID:   [id number]  ]

## 2010-12-31 NOTE — Assessment & Plan Note (Signed)
Summary: sore throat x 5 days///sph   Vital Signs:  Patient profile:   45 year old female Weight:      148.8 pounds BMI:     28.22 Temp:     98.7 degrees F oral Pulse rate:   64 / minute Resp:     15 per minute BP sitting:   114 / 72  (left arm) Cuff size:   regular  Vitals Entered By: Shonna Chock CMA (October 22, 2010 2:05 PM) CC: Sore throat x 5 days , URI symptoms   CC:  Sore throat x 5 days  and URI symptoms.  History of Present Illness: URI Symptoms      This is a 45 year old woman who presents with URI symptoms X 5 days.  The patient reports nasal congestion, purulent nasal discharge,  intermittent epistaxis and sore throat, but denies productive cough and earache.  Associated symptoms include low-grade fever (<100.5 degrees).  The patient denies dyspnea and wheezing.  The patient also reports headache, muscle aches,  severe fatigue & bilateral facial pain and tender adenopathy.  The patient denies the following risk factors for Strep sinusitis: tooth pain.  Rx: vinegar   Current Medications (verified): 1)  Crestor 20 Mg Tabs (Rosuvastatin Calcium) .Marland Kitchen.. 1 By Mouth M/w/f  Allergies (verified): No Known Drug Allergies  Physical Exam  General:  Uncomfortable but in no acute distress; alert,appropriate and cooperative throughout examination Ears:  External ear exam shows no significant lesions or deformities.  Otoscopic examination reveals  bilateral wax impactions Nose:  External nasal examination shows no deformity or inflammation. Nasal mucosa are  erythematous , esp R . Septal dislocation Mouth:  Oral mucosa and oropharynx without lesions or exudates.  Teeth in good repair. Mild pharyngeal erythema.   Lungs:  Normal respiratory effort, chest expands symmetrically. Lungs are clear to auscultation, no crackles or wheezes. Heart:  Normal rate and regular rhythm. S1 and S2 normal without gallop, murmur, click, rub or other extra sounds. Cervical Nodes:  R anterior LN tender  and L anterior LN tender.   Axillary Nodes:  No palpable lymphadenopathy   Impression & Recommendations:  Problem # 1:  PHARYNGITIS-ACUTE (ICD-462)  Beta Strep negative  Her updated medication list for this problem includes:    Amoxicillin 500 Mg Caps (Amoxicillin) .Marland Kitchen... 1 three times a day  Problem # 2:  SINUSITIS- ACUTE-NOS (ICD-461.9)  Her updated medication list for this problem includes:    Amoxicillin 500 Mg Caps (Amoxicillin) .Marland Kitchen... 1 three times a day  Complete Medication List: 1)  Crestor 20 Mg Tabs (Rosuvastatin calcium) .Marland Kitchen.. 1 by mouth m/w/f 2)  Amoxicillin 500 Mg Caps (Amoxicillin) .Marland Kitchen.. 1 three times a day  Other Orders: Rapid Strep (40102)  Patient Instructions: 1)  Neti pot once daily until sinuses are clear. Zicam Melts for sore throat. 2)  Drink as much NON dairy  fluid as you can tolerate for the next few days. Prescriptions: AMOXICILLIN 500 MG CAPS (AMOXICILLIN) 1 three times a day  #30 x 0   Entered and Authorized by:   Marga Melnick MD   Signed by:   Marga Melnick MD on 10/22/2010   Method used:   Faxed to ...       [facility] [phone]* (retail)       [address]       [place], [place]  [address]       Ph: [phone]  Fax: [phone]   RxID:   [id number]    Orders Added: 1)  Rapid Strep [[address]] 2)  Est. Patient Level III [52841]

## 2010-12-31 NOTE — Letter (Signed)
Summary: Results Follow up Letter  [place] [facility]  [address] [place], [place] [address]   Phone: [phone]  Fax: [phone]    08/19/2007 MRN: [id number]  DIALA WAXMAN [address] [place], [place]  [address]  Dear Ms. Speas,  The following are the results of your recent test(s):  Test         Result    Pap Smear:        Normal _____  Not Normal _____ Comments: ______________________________________________________ Cholesterol: LDL(Bad cholesterol):         Your goal is less than:         HDL (Good cholesterol):       Your goal is more than: Comments:  ______________________________________________________ Mammogram:        Normal _____  Not Normal _____ Comments:  ___________________________________________________________________ Hemoccult:        Normal _____  Not normal _______ Comments:    _____________________________________________________________________ Other Tests:  Please see attached results and comments   We routinely do not discuss normal results over the telephone.  If you desire a copy of the results, or you have any questions about this information we can discuss them at your next office visit.   Sincerely,

## 2010-12-31 NOTE — Assessment & Plan Note (Signed)
Summary: CPX,LABS PRIOR,BCBS INS/RH.....   Vital Signs:  Patient profile:   45 year old female Height:      61 inches Weight:      152 pounds BMI:     28.82 Temp:     98.2 degrees F oral Pulse rate:   68 / minute Resp:     12 per minute BP sitting:   112 / 74  (left arm)  Vitals Entered By: Doristine Devoid (December 27, 2009 3:39 PM)  CC: CPX, General Medical Evaluation   CC:  CPX and General Medical Evaluation.  History of Present Illness: Savannah Rogers is here for a physical; she is asymptomatic. Labs reviewed : Triglycerides up from 152 to 248; LDL up from 91 to 159.7. She stopped Crestor > 3 months ago.  Preventive Screening-Counseling & Management  Alcohol-Tobacco     Smoking Status: never  Caffeine-Diet-Exercise     Does Patient Exercise: yes  Allergies: No Known Drug Allergies  Past History:  Past Medical History: Hyperlipidemia  Past Surgical History: G3 P1 A2; Bone Marrow biopsy 2006 for elevated WBC  Family History: PGM & PGF  CVA; no FH MI Father: HTN Mother: HTN Siblings: neg  Social History: Occupation:hotel management  Never Smoked Alcohol use-no Regular exercise-yes: walking 45 min 3X/week  Review of Systems  The patient denies anorexia, fever, vision loss, decreased hearing, hoarseness, chest pain, syncope, dyspnea on exertion, peripheral edema, prolonged cough, headaches, hemoptysis, abdominal pain, melena, hematochezia, severe indigestion/heartburn, hematuria, suspicious skin lesions, depression, unusual weight change, abnormal bleeding, enlarged lymph nodes, and angioedema.         Weight up 8 # over 1 year  Physical Exam  General:  well-nourished,in no acute distress; alert,appropriate and cooperative throughout examination Head:  Normocephalic and atraumatic without obvious abnormalities.  Eyes:  No corneal or conjunctival inflammation noted.  Perrla. Funduscopic exam benign, without hemorrhages, exudates or papilledema.  Ears:  Some  wax bilaterally Nose:  External nasal examination shows no deformity or inflammation. Nasal mucosa are pink and moist without lesions or exudates. Mouth:  Oral mucosa and oropharynx without lesions or exudates.  Teeth in good repair. Neck:  No deformities, masses, or tenderness noted. Lungs:  Normal respiratory effort, chest expands symmetrically. Lungs are clear to auscultation, no crackles or wheezes. Heart:  normal rate, regular rhythm, no gallop, no rub, no JVD, no HJR, and grade 1/2-1 /6 systolic murmur.   Abdomen:  Bowel sounds positive,abdomen soft and non-tender without masses, organomegaly or hernias noted. Msk:  No deformity or scoliosis noted of thoracic or lumbar spine.   Pulses:  R and L carotid,radial,dorsalis pedis and posterior tibial pulses are full and equal bilaterally Extremities:  No clubbing, cyanosis, edema, or deformity noted with normal full range of motion of all joints.   Neurologic:  alert & oriented X3 and DTRs symmetrical and normal.   Skin:  Scattered nevi Cervical Nodes:  No lymphadenopathy noted Axillary Nodes:  No palpable lymphadenopathy Psych:  memory intact for recent and remote, normally interactive, and good eye contact.     Impression & Recommendations:  Problem # 1:  HEALTH MAINTENANCE EXAM (ICD-V70.0)  Orders: EKG w/ Interpretation (93000)  Problem # 2:  HYPERLIPIDEMIA (ICD-272.4) uncontrolled lipids off statin The following medications were removed from the medication list:    Crestor 20 Mg Tabs (Rosuvastatin calcium) .Marland Kitchen... 1 tab mon,wed,&fri.  Patient Instructions: 1)  Read Dr Gildardo Griffes Eat , Drink & Be Healthy for information on dietary cholesterol. After 4-6 months of  nutrition change please check NMR Lipoprofile (272.4) to optimally assess long term cardiac risk.   Immunization History:  Tetanus/Td Immunization History:    Tetanus/Td:  historical (01/29/2005)

## 2011-01-06 ENCOUNTER — Ambulatory Visit (INDEPENDENT_AMBULATORY_CARE_PROVIDER_SITE_OTHER): Payer: BC Managed Care – PPO | Admitting: Internal Medicine

## 2011-01-06 ENCOUNTER — Encounter: Payer: Self-pay | Admitting: Internal Medicine

## 2011-01-06 DIAGNOSIS — E785 Hyperlipidemia, unspecified: Secondary | ICD-10-CM

## 2011-01-16 NOTE — Assessment & Plan Note (Signed)
Summary: PER PT WANTS TO FOLLOWUP ON LABS/KN  Nurse Visit   Vital Signs:  Patient profile:   45 year old female Weight:      147.4 pounds BMI:     27.95 Pulse rate:   72 / minute Resp:     14 per minute BP sitting:   130 / 84  (left arm) Cuff size:   regular  Vitals Entered By: Shonna Chock CMA (January 06, 2011 4:03 PM)  Past History:  Past Medical History: Hyperlipidemia: NMR lipoprofile 05/2010: LDL 55 ( 1415/711), HDL 48, TG 173. No FH of MI. Framingham Study LDL  goal  LDL goal = < 160.   CC:  Follow-up visit: discuss labs (patient with mailed copy) and Lipid Management.  History of Present Illness: Hyperlipidemia Follow-Up      This is a 45 year old woman who presents for Hyperlipidemia follow-up.  The patient denies muscle aches, GI upset, abdominal pain, flushing, itching, constipation, diarrhea, and fatigue.  The patient denies the following symptoms: chest pain/pressure, exercise intolerance, dypsnea, palpitations, syncope, and pedal edema.  Compliance with medications (by patient report) has been near 100%; Crestor 20 mg 3X/week..  Dietary compliance has been good.  The patient reports exercising 3-4X per week.  Adjunctive measures currently used by the patient include fiber.    Lipid Management History:      Positive NCEP/ATP III risk factors include HDL cholesterol less than 40.  Negative NCEP/ATP III risk factors include female age less than 49 years old, no history of early menopause without estrogen hormone replacement, non-diabetic, no family history for ischemic heart disease, non-tobacco-user status, non-hypertensive, no ASHD (atherosclerotic heart disease), no prior stroke/TIA, no peripheral vascular disease, and no history of aortic aneurysm.     Physical Exam  General:  well-nourished; alert,appropriate and cooperative throughout examination Lungs:  Normal respiratory effort, chest expands symmetrically. Lungs are clear to auscultation, no crackles or  wheezes. Heart:  normal rate, regular rhythm, no gallop, no rub, no JVD, and grade 1 /6 systolic murmur.   Pulses:  R and L carotid,radial,dorsalis pedis and posterior tibial pulses are full and equal bilaterally Neurologic:  alert & oriented X3, strength normal in all extremities, and DTRs symmetrical and normal.     Review of Systems Neuro:  Complains of numbness; denies tingling; Occasional numbness RUE & RLE  ; PMH of cervical issues  for which  traction was Rxed..   Impression & Recommendations:  Problem # 1:  HYPERLIPIDEMIA (ICD-272.4)  Her updated medication list for this problem includes:    Crestor 20 Mg Tabs (Rosuvastatin calcium) .Marland Kitchen... 1 by mouth m/w/f  Complete Medication List: 1)  Crestor 20 Mg Tabs (Rosuvastatin calcium) .Marland Kitchen.. 1 by mouth m/w/f  Lipid Assessment/Plan:      Based on NCEP/ATP III, the patient's risk factor category is "0-1 risk factors".  The patient's lipid goals are as follows: Total cholesterol goal is 200; LDL cholesterol goal is 100; HDL cholesterol goal is 40; Triglyceride goal is 150.  Her LDL cholesterol goal has been met.  Secondary causes for hyperlipidemia have been ruled out.  She has been counseled on adjunctive measures for lowering her cholesterol and has been provided with dietary instructions.     Patient Instructions: 1)   Neurosurgical referral neede  if R arm & leg symptoms persistently .Restrict High Fructose Corn Syrup sugar as much as possible as discussed. Schedule a special advanced lipid test in 10 weeks OFF Crestor : 2)  [facility] Lipid WNUUV(2536U), ICD-9: 272.4   CC: Follow-up visit: discuss labs (patient with mailed copy), Lipid Management   Current Medications (verified): 1)  Crestor 20 Mg Tabs (Rosuvastatin Calcium) .Marland Kitchen.. 1 By Mouth M/w/f  Allergies (verified): No Known Drug Allergies  Orders Added: 1)  Est. Patient Level III [44034]

## 2011-03-17 ENCOUNTER — Other Ambulatory Visit: Payer: BC Managed Care – PPO

## 2011-04-18 NOTE — Assessment & Plan Note (Signed)
[facility]                        [place] OFFICE NOTE   Savannah, Rogers                        MRN:          [id number]  DATE:03/18/2007                            DOB:          [birth year]    Savannah Rogers is a 45 year old female who presents as a new patient.   Her acute symptoms consist of a few days of sore throat, sneezing,  watery eyes,& rhinorrhea, which she attributes to pollen exposures. She  denies head congestion, headaches, fatigue or productive cough. She has  had minimal yellow discharge from her nose.   PAST MEDICAL HISTORY:  1. Includes three pregnancies and one delivery.  2. She had a bone marrow biopsy in 2006 by Dr. Nance Pear to      evaluate elevated white counts which have ranged from 11.6 to 17.8.      The bone marrow biopsy revealed essentially normal cell population      with no lymphoid proliferation.  3. Additionally she has a history of dyslipidemia and was on Statin      for a while but this was stopped.   FAMILY HISTORY:  Includes hypertension in both parents. There is no  family history of cancer, diabetes, heart attack or stroke. Her cousin  does have dyslipidemia as well.   SOCIAL HISTORY:  She has never smoked and does not drink.  She is on a  low fat diet. She exercises 3-4 times a week with no symptoms.   REVIEW OF SYSTEMS:  Includes some shortness of breath with exercise  only, and there is no history of asthma.   MEDICATIONS:  She is presently on multivitamins.   ALLERGIES:  She has No known drug allergies.   PHYSICAL EXAMINATION:  She is 5 feet, 2 inches and weighs 142 fully  clothed. Pulse is 57, respiratory rate 14, blood pressure 132/88 (she  denies ingestion of decongestants). Her temperature is 98.9.   Pupils are equal, round and reactive to light; funduscopic exam reveals  normal vasculature. Dental hygiene is good. There is increased cerumen  in the left canal. There is a nevus  over the right external ear which  measures 4 x 6 mm; by history this is unchanged. She has no  lymphadenopathy of head, neck or axilla. Thyroid is normal to palpation.  She has no carotid bruits and no murmurs are noted.   Clear to auscultation.   She has no lymphadenopathy or organomegaly. All pulses are intact. There  is no edema.   There are no musculoskeletal changes.   Neuro/psychiatric evaluation is normal.   Arrangements will be made for fasting labs. A copy of this will be sent  to Dr. Lelon Perla to prevent duplication of testing.   I do recommend that she have an NMR lipo profile to optimally assess her  cardiovascular risk long term. Her prior LDL was 152; without such a  panel it would be impossible tooptimally determine risk. The family  history of dyslipidemia also elevates need for this test.   I will recommend generic loratadine 10 mg daily. I will give her a  sample of a topicalnasal steroid. Amoxicillin will be prescribed should  she have facial pain, fever, or increasing purulence.     [name]. Alwyn Ren, MD,FACP,FCCP  Electronically Signed    [facility]  DD: 03/18/2007  DT: 03/18/2007  Job #: [id number]   cc:   Ivor Reining, MD

## 2011-05-14 ENCOUNTER — Other Ambulatory Visit: Payer: Self-pay | Admitting: Obstetrics and Gynecology

## 2011-05-14 DIAGNOSIS — Z1231 Encounter for screening mammogram for malignant neoplasm of breast: Secondary | ICD-10-CM

## 2011-05-16 ENCOUNTER — Other Ambulatory Visit: Payer: Self-pay | Admitting: Internal Medicine

## 2011-05-19 ENCOUNTER — Other Ambulatory Visit (INDEPENDENT_AMBULATORY_CARE_PROVIDER_SITE_OTHER): Payer: BC Managed Care – PPO

## 2011-05-19 DIAGNOSIS — E785 Hyperlipidemia, unspecified: Secondary | ICD-10-CM

## 2011-05-19 NOTE — Progress Notes (Signed)
Labs only

## 2011-05-22 ENCOUNTER — Ambulatory Visit: Payer: BC Managed Care – PPO

## 2011-06-09 ENCOUNTER — Encounter: Payer: Self-pay | Admitting: Internal Medicine

## 2011-06-12 ENCOUNTER — Encounter: Payer: Self-pay | Admitting: Internal Medicine

## 2011-06-13 ENCOUNTER — Ambulatory Visit (INDEPENDENT_AMBULATORY_CARE_PROVIDER_SITE_OTHER): Payer: BC Managed Care – PPO | Admitting: Internal Medicine

## 2011-06-13 VITALS — BP 126/88 | HR 72 | Wt 146.8 lb

## 2011-06-13 DIAGNOSIS — E785 Hyperlipidemia, unspecified: Secondary | ICD-10-CM

## 2011-06-13 DIAGNOSIS — E162 Hypoglycemia, unspecified: Secondary | ICD-10-CM

## 2011-06-13 DIAGNOSIS — E8881 Metabolic syndrome: Secondary | ICD-10-CM

## 2011-06-13 NOTE — Patient Instructions (Addendum)
Please  schedule fasting Labs in 10 weeks : Lipids (277.7).  The most common cause of elevated triglycerides is the ingestion of sugar from high fructose corn syrup sources. You should consume less than 40 grams  of sugar per day from foods and drinks with high fructose corn syrup as number 1, 2, 3, or #4 on the label. As TG go up, HDL or good cholesterol goes down. Also uric acid which causes gout will go up.Follow the low carb nutrition program in [employer] as closely as possible to prevent Diabetes

## 2011-06-13 NOTE — Progress Notes (Signed)
  Subjective:    Patient ID: Savannah Rogers, female    DOB: 05-Nov-1966, 45 y.o.   MRN: [id number]  HPI Dyslipidemia assessment: Prior Advanced Lipid Testing: NMR 2011: LDL 55 (1415/711), HDL 48, TG 173.   Family history of premature CAD/ MI: no .  Nutrition: [place] food. She denies the intake of high fructose corn syrup sugar in packaged products; she does be getting the shooting snacks to treat reactive hypoglycemia symptoms intermittently. She was unaware that these products would more likely than not contain high fructose corn syrup sugar  Exercise: CVE 3X/ week 60 min . Diabetes : A1c 5.9% but insulin 16 . HTN: no. Smoking history  : never .   Weight :  stable. Off Crestor   Since 2/12 due to myalgas   Lab results reviewed :major risks are TG > 150, uric acid 8.5, & insulin > 16 ; based on the progressive increase in triglycerides, nutrition and genetics appears to be major contributing factor.     Review of Systems ROS:no fatigue; chest pain  ;claudication; palpitations; abd pain/bowel changes; myalgias; memory loss;skin changes. At roughly 1:00 pm she will have lightheadedness dizziness. She  eats candy or "honey buns" from the vending machine. Reading labels was discussed     Objective:   Physical Exam Gen.: Healthy and well-nourished in appearance. Alert, appropriate and cooperative throughout exam. Lungs: Normal respiratory effort; chest expands symmetrically. Lungs are clear to auscultation without rales, wheezes, or increased work of breathing. Heart: Normal rate and rhythm. Normal S1 and S2. No gallop, click, or rub. S4 ; no murmur.                                                                             Musculoskeletal/extremities: No clubbing, cyanosis, edema, or deformity noted. Nail health  good. Vascular: Carotid, radial artery, dorsalis pedis and  posterior tibial pulses are full and equal. No bruits present. Neurologic: Alert and oriented x3.   Skin: Intact without  suspicious lesions or rashes. Psych: Mood and affect are normal. Normally interactive                                                                                          Assessment & Plan:  #1 dyslipidemia; a major issue appears to be hypertriglyceridemia.  #2 elevated insulin levels  #3 reactive hypoglycemia  Plan: A trial of WelChol with fasting lipids after 10 weeks. Avoidance of High Fructose Corn Syrup sugar was discussed.

## 2011-06-15 MED ORDER — COLESEVELAM HCL 625 MG PO TABS: 1875.0000 mg | ORAL_TABLET | Freq: Two times a day (BID) | ORAL | Status: DC

## 2011-07-01 ENCOUNTER — Other Ambulatory Visit (HOSPITAL_COMMUNITY)
Admission: RE | Admit: 2011-07-01 | Discharge: 2011-07-01 | Disposition: A | Discharge status: Home / Self Care | Payer: BC Managed Care – PPO | Source: Ambulatory Visit | Attending: Obstetrics & Gynecology | Admitting: Obstetrics & Gynecology

## 2011-07-01 ENCOUNTER — Other Ambulatory Visit: Payer: Self-pay | Admitting: Obstetrics & Gynecology

## 2011-07-01 DIAGNOSIS — Z1159 Encounter for screening for other viral diseases: Secondary | ICD-10-CM | POA: Insufficient documentation

## 2011-07-01 DIAGNOSIS — Z1231 Encounter for screening mammogram for malignant neoplasm of breast: Secondary | ICD-10-CM

## 2011-07-01 DIAGNOSIS — Z01419 Encounter for gynecological examination (general) (routine) without abnormal findings: Secondary | ICD-10-CM | POA: Insufficient documentation

## 2011-07-10 ENCOUNTER — Ambulatory Visit
Admission: RE | Admit: 2011-07-10 | Discharge: 2011-07-10 | Disposition: A | Discharge status: Home / Self Care | Payer: BC Managed Care – PPO | Source: Ambulatory Visit | Attending: Obstetrics & Gynecology | Admitting: Obstetrics & Gynecology

## 2011-07-10 DIAGNOSIS — Z1231 Encounter for screening mammogram for malignant neoplasm of breast: Secondary | ICD-10-CM

## 2011-08-26 ENCOUNTER — Other Ambulatory Visit: Payer: Self-pay | Admitting: Internal Medicine

## 2011-08-26 DIAGNOSIS — E8881 Metabolic syndrome: Secondary | ICD-10-CM

## 2011-08-27 ENCOUNTER — Other Ambulatory Visit (INDEPENDENT_AMBULATORY_CARE_PROVIDER_SITE_OTHER): Payer: BC Managed Care – PPO

## 2011-08-27 DIAGNOSIS — E8881 Metabolic syndrome: Secondary | ICD-10-CM

## 2011-08-27 LAB — LIPID PANEL
Cholesterol: 199 mg/dL (ref 0–200)
HDL: 42.9 mg/dL (ref >39.00)
Total CHOL/HDL Ratio: 5
Triglycerides: 257 mg/dL — ABNORMAL HIGH (ref 0.0–149.0)
VLDL: 51.4 mg/dL — ABNORMAL HIGH (ref 0.0–40.0)

## 2011-08-27 LAB — LDL CHOLESTEROL, DIRECT: Direct LDL: 128.4 mg/dL

## 2011-08-27 NOTE — Progress Notes (Signed)
Labs only

## 2011-10-06 ENCOUNTER — Encounter: Payer: Self-pay | Admitting: Internal Medicine

## 2011-10-06 ENCOUNTER — Ambulatory Visit (INDEPENDENT_AMBULATORY_CARE_PROVIDER_SITE_OTHER): Payer: BC Managed Care – PPO | Admitting: Internal Medicine

## 2011-10-06 VITALS — BP 114/70 | HR 71 | Temp 98.3°F | Wt 145.8 lb

## 2011-10-06 DIAGNOSIS — E785 Hyperlipidemia, unspecified: Secondary | ICD-10-CM

## 2011-10-06 MED ORDER — FENOFIBRATE MICRONIZED 130 MG PO CAPS: 130.0000 mg | ORAL_CAPSULE | Freq: Every day | ORAL | Status: DC

## 2011-10-06 NOTE — Progress Notes (Signed)
  Subjective:    Patient ID: Savannah Rogers, female    DOB: [birth year], 45 y.o.   MRN: [id number]  HPI Dyslipidemia assessment: Prior Advanced Lipid Testing: NMR & BHP reviewed .   Family history of premature CAD/ MI: no  .  Nutrition: [place] diet .  Exercise: 3 X/ week 60-90 treadmill or cycling . Diabetes : no . HTN: no. Smoking history  : no .    Lab results reviewed :TG up from 174 to 257.     Review of Systems Weight : up 4 #. ROS: fatigue: no ; chest pain : no ;claudication: no; palpitations: no; abd pain/bowel changes: no ; myalgias:no but some joint symptoms;  syncope : no ; skin changes: itching over feet.     Objective:   Physical Exam she is healthy and well-nourished in appearance  Chest is clear with no increased work of breathing.  She has an S4 with no significant murmurs or gallops.  All pulses are intact without bruits.  She has some excoriations over her ankles.        Assessment & Plan:  #1 dyslipidemia, mixed. The major risk appears to be the elevated triglycerides with a prediabetic picture into early insulin resistance based on the [facility] panel.  #2 excoriations ; I doubt a drug reaction; to be sure another triglyceride lowering agent will be initiated in place of Welchol.

## 2011-10-06 NOTE — Patient Instructions (Signed)
Mix Eucerin 1 part to Cort Aid 1 part and apply  Twice a day to itchy area of skin as needed .  Please  schedule fasting Labs in 10 weeks :Lipids, hepatic panel.  Please bring these instructions to that Lab appt.

## 2011-10-24 ENCOUNTER — Telehealth: Payer: Self-pay | Admitting: *Deleted

## 2011-10-24 MED ORDER — HYDROCODONE-HOMATROPINE 5-1.5 MG/5ML PO SYRP: 5.0000 mL | ORAL_SOLUTION | Freq: Four times a day (QID) | ORAL | Status: AC | PRN

## 2011-10-24 NOTE — Telephone Encounter (Signed)
Pt advised that she has a cough and wanted a rx called in for her. Last OV 10-06-11

## 2011-10-24 NOTE — Telephone Encounter (Signed)
Addended by: Derry Lory A on: 10/24/2011 06:08 PM   Modules accepted: Orders

## 2011-10-24 NOTE — Telephone Encounter (Signed)
Manually faxed rx for hydrocodone to pharmacy per pt stated she is not allergic to codeine. Pt understood instructions.

## 2011-10-24 NOTE — Telephone Encounter (Signed)
Hydromet 120 cc 1 teaspoon every 6 hours as needed if not allergic to codeine. If allergic to  codeine Tessalon 100 mg one every 8 hours, dispense 21

## 2011-11-21 ENCOUNTER — Ambulatory Visit (INDEPENDENT_AMBULATORY_CARE_PROVIDER_SITE_OTHER): Payer: BC Managed Care – PPO | Admitting: Internal Medicine

## 2011-11-21 ENCOUNTER — Ambulatory Visit (HOSPITAL_BASED_OUTPATIENT_CLINIC_OR_DEPARTMENT_OTHER)
Admission: RE | Admit: 2011-11-21 | Discharge: 2011-11-21 | Disposition: A | Discharge status: Home / Self Care | Payer: BC Managed Care – PPO | Source: Ambulatory Visit | Attending: Internal Medicine | Admitting: Internal Medicine

## 2011-11-21 ENCOUNTER — Encounter: Payer: Self-pay | Admitting: Internal Medicine

## 2011-11-21 DIAGNOSIS — J209 Acute bronchitis, unspecified: Secondary | ICD-10-CM

## 2011-11-21 DIAGNOSIS — R918 Other nonspecific abnormal finding of lung field: Secondary | ICD-10-CM

## 2011-11-21 DIAGNOSIS — R059 Cough, unspecified: Secondary | ICD-10-CM

## 2011-11-21 DIAGNOSIS — R05 Cough: Secondary | ICD-10-CM | POA: Insufficient documentation

## 2011-11-21 DIAGNOSIS — R042 Hemoptysis: Secondary | ICD-10-CM

## 2011-11-21 MED ORDER — CEFUROXIME AXETIL 500 MG PO TABS: 500.0000 mg | ORAL_TABLET | Freq: Two times a day (BID) | ORAL | Status: AC

## 2011-11-21 NOTE — Progress Notes (Signed)
  Subjective:    Patient ID: Savannah Rogers, female    DOB: 1966-05-18, 45 y.o.   MRN: [id number]  HPI Respiratory tract infection Onset/symptoms:3 weeks as green sputum with blood; UC Rxed for 10 days with resolution. No CXray done.Last week cough recurred as dry cough Exposures (illness/environmental/extrinsic):sick people @ wedding but she was already ill  Progression of symptoms:cough worse; SS pain with coughing Treatments/response:Nyquil helped temporarily Present symptoms: Fever/chills/sweats:no Frontal headache:no Facial pain:no Nasal purulence:yellow Sore throat:yes Dental pain:no  Lymphadenopathy:yes Wheezing/shortness of breath:some SOB Cough/sputum/hemoptysis:yellow sputum, > volume from chest than head Pleuritic pain:no Associated extrinsic/allergic symptoms:itchy eyes/ sneezing:sneezing & itchy R ear Past medical history: Seasonal allergies: diagnosed in March in [place]/asthma:no Smoking history:never WU:[id number] ( specifically no TB)           Review of Systems  She describes some right clavicular pain as well.     Objective:   Physical Exam General appearance is of good health and nourishment; no acute distress or increased work of breathing is present.  No  lymphadenopathy about the head, neck, or axilla noted. She is tender to palpation under the right mandible. No mass or changes palpable.  Eyes: No conjunctival inflammation or lid edema is present.   Ears:  The right ear canal is clear; tympanic membrane is normal without effusion or erythema. There is wax in the left otic canal.  Nose:  External nasal examination shows no deformity or inflammation. Nasal mucosa are dry without lesions or exudates. No septal dislocation .No obstruction to airflow.   Oral exam: Dental hygiene is good; lips and gums are healthy appearing.There is no oropharyngeal erythema or exudate noted. There is no evidence clinically of a parapharyngeal abscess on the  right.  Neck:  No deformities, thyromegaly, masses noted. There is no cough or abnormality of the clavicle. Supple with full range of motion without pain  Heart:  Normal rate and regular rhythm. S1 and S2 normal without gallop, murmur, click, rub or other extra sounds.   Lungs:Chest clear to auscultation; no wheezes, rhonchi,rales ,or rubs present.No increased work of breathing.    Extremities:  No cyanosis, edema, or clubbing  noted    Skin: Warm & dry           Assessment & Plan:   #1 bronchitis, acute with purulent sputum. She also had bronchitis 3 weeks ago with purulent secretions and some hemoptysis. She denies recurrence of the hemoptysis. Chest x-ray was not done at that time. Chest x-ray is indicated because of that history and her travel history .  #2 upper respiratory tract symptoms without criteria for rhinosinusitis diagnosis. Tenderness in the right submandibular area without clinical abscess Plan: See orders and recommendations

## 2011-11-21 NOTE — Patient Instructions (Signed)
Plain Mucinex for thick secretions ;force NON dairy fluids . Use a Neti pot daily as needed for sinus congestion .Order for x-rays entered into  the computer; these will be performed at [facility]. No appointment is necessary.

## 2011-12-28 ENCOUNTER — Other Ambulatory Visit: Payer: Self-pay | Admitting: Internal Medicine

## 2011-12-29 NOTE — Telephone Encounter (Signed)
Options for respiratory  symptoms include the following:   Zicam Melts or Zinc lozenges ; vitamin C 2000 mg daily; & Echinacea for 4-7 days.   If an antibiotic is felt to be indicated; an appointment is necessary.  If antibiotics are prescribed inappropriately there is risk of resistant infection such as Staph and possible health  threatening condition such as colitis.These are the guidelines provided to physicians  by the American Medical Association an the  [place] Medical Board  

## 2011-12-30 NOTE — Telephone Encounter (Signed)
Left message on voicemail informing patient OV needed is ABX needed per MD

## 2011-12-31 ENCOUNTER — Ambulatory Visit: Payer: BC Managed Care – PPO | Admitting: Internal Medicine

## 2012-01-01 ENCOUNTER — Other Ambulatory Visit: Payer: Self-pay | Admitting: Internal Medicine

## 2012-01-01 DIAGNOSIS — E785 Hyperlipidemia, unspecified: Secondary | ICD-10-CM

## 2012-01-02 ENCOUNTER — Other Ambulatory Visit (INDEPENDENT_AMBULATORY_CARE_PROVIDER_SITE_OTHER): Payer: BC Managed Care – PPO

## 2012-01-02 DIAGNOSIS — E785 Hyperlipidemia, unspecified: Secondary | ICD-10-CM

## 2012-01-02 LAB — HEPATIC FUNCTION PANEL
ALT: 17 U/L (ref 0–35)
AST: 22 U/L (ref 0–37)
Albumin: 4 g/dL (ref 3.5–5.2)
Alkaline Phosphatase: 52 U/L (ref 39–117)
Bilirubin, Direct: 0 mg/dL (ref 0.0–0.3)
Total Bilirubin: 0.2 mg/dL — ABNORMAL LOW (ref 0.3–1.2)
Total Protein: 7.5 g/dL (ref 6.0–8.3)

## 2012-01-02 LAB — LIPID PANEL
Cholesterol: 187 mg/dL (ref 0–200)
HDL: 46.1 mg/dL (ref >39.00)
LDL Cholesterol: 117 mg/dL — ABNORMAL HIGH (ref 0–99)
Total CHOL/HDL Ratio: 4
Triglycerides: 118 mg/dL (ref 0.0–149.0)
VLDL: 23.6 mg/dL (ref 0.0–40.0)

## 2012-01-27 ENCOUNTER — Other Ambulatory Visit: Payer: Self-pay | Admitting: Internal Medicine

## 2012-02-04 ENCOUNTER — Ambulatory Visit (INDEPENDENT_AMBULATORY_CARE_PROVIDER_SITE_OTHER): Payer: BC Managed Care – PPO | Admitting: Internal Medicine

## 2012-02-04 DIAGNOSIS — R05 Cough: Secondary | ICD-10-CM

## 2012-02-04 DIAGNOSIS — R059 Cough, unspecified: Secondary | ICD-10-CM

## 2012-02-04 MED ORDER — FLUTICASONE PROPIONATE 50 MCG/ACT NA SUSP: 2.0000 | Freq: Every day | NASAL | Status: DC

## 2012-02-04 MED ORDER — AZITHROMYCIN 250 MG PO TABS: ORAL_TABLET | ORAL | Status: AC

## 2012-02-04 NOTE — Progress Notes (Signed)
  Subjective:    Patient ID: Savannah Rogers, female    DOB: Dec 16, 1965, 46 y.o.   MRN: [id number]  HPI Acute visit  Sx started ~ 10 days ago: intense cough , ST , ear ache, RN , occ sputum production white-yellow. Patient is concerned because she had similar symptoms in November, December and again in mid-January. In December, she received ceftin and a chest x-ray. The x-ray show no major problems. Wonders if this is something different than bronchitis.  Past Medical History  Diagnosis Date  . Hyperlipidemia     NMR 05-2010:ldl 55(1415/711),HDL 48, TG 173. NO FH of MI.[place] study LDL goal  LDL goal =<160     Review of Systems No history of asthma or tobacco abuse. Denies any GERD symptoms No recent fever, chills, weight loss. She admits to itchy eyes-nose and even her  throat. No wheezing.     Objective:   Physical Exam Alert, oriented x3, no apparent distress. HEENT: Right tympanic membrane normal, left tympanic membrane obscured by wax, nose is congested, face symmetric and nontender to palpation. Throat without redness or discharge. Eyes slightly injected but no discharge. Lungs: Clear to auscultation bilaterally Cardiovascular regular rhythm without murmur Extremities no edema.       Assessment & Plan:  Presents with persistent episodes of cough and nasal congestion associated with allergy symptoms. No GERD symptoms. Symptoms could be related to allergies or an atypical infection. Could also be asthma.  Plan: Nasal steroids  Z-Pak See instructions

## 2012-02-04 NOTE — Patient Instructions (Signed)
For cough, take Mucinex DM twice a day as needed  For allergies use flonase as prescribed and OTC Claritin 56m g every day Take the antibiotic as prescribed ----> zithromax Call if no better in few days or if the symptoms resurface , you will need a referral to a specialist c

## 2012-02-05 ENCOUNTER — Encounter: Payer: Self-pay | Admitting: Internal Medicine

## 2012-03-01 HISTORY — PX: OTHER SURGICAL HISTORY: SHX169

## 2012-03-15 ENCOUNTER — Observation Stay (HOSPITAL_BASED_OUTPATIENT_CLINIC_OR_DEPARTMENT_OTHER)
Admission: EM | Admit: 2012-03-15 | Discharge: 2012-03-17 | DRG: 494 | Disposition: A | Discharge status: Home / Self Care | Payer: BC Managed Care – PPO | Attending: General Surgery | Admitting: General Surgery

## 2012-03-15 ENCOUNTER — Encounter (HOSPITAL_BASED_OUTPATIENT_CLINIC_OR_DEPARTMENT_OTHER): Payer: Self-pay | Admitting: *Deleted

## 2012-03-15 ENCOUNTER — Emergency Department (INDEPENDENT_AMBULATORY_CARE_PROVIDER_SITE_OTHER): Payer: BC Managed Care – PPO

## 2012-03-15 DIAGNOSIS — K819 Cholecystitis, unspecified: Secondary | ICD-10-CM

## 2012-03-15 DIAGNOSIS — R935 Abnormal findings on diagnostic imaging of other abdominal regions, including retroperitoneum: Secondary | ICD-10-CM

## 2012-03-15 DIAGNOSIS — R1013 Epigastric pain: Secondary | ICD-10-CM

## 2012-03-15 DIAGNOSIS — K8010 Calculus of gallbladder with chronic cholecystitis without obstruction: Secondary | ICD-10-CM

## 2012-03-15 DIAGNOSIS — J9819 Other pulmonary collapse: Secondary | ICD-10-CM

## 2012-03-15 DIAGNOSIS — E7800 Pure hypercholesterolemia, unspecified: Secondary | ICD-10-CM | POA: Insufficient documentation

## 2012-03-15 DIAGNOSIS — Z79899 Other long term (current) drug therapy: Secondary | ICD-10-CM | POA: Insufficient documentation

## 2012-03-15 DIAGNOSIS — R1011 Right upper quadrant pain: Secondary | ICD-10-CM

## 2012-03-15 LAB — PREGNANCY, URINE: Preg Test, Ur: NEGATIVE

## 2012-03-15 LAB — URINE MICROSCOPIC-ADD ON

## 2012-03-15 LAB — URINALYSIS, ROUTINE W REFLEX MICROSCOPIC
Bilirubin Urine: NEGATIVE
Glucose, UA: NEGATIVE mg/dL
Ketones, ur: NEGATIVE mg/dL
Nitrite: NEGATIVE
Protein, ur: NEGATIVE mg/dL
Specific Gravity, Urine: 1.01 (ref 1.005–1.030)
Urobilinogen, UA: 0.2 mg/dL (ref 0.0–1.0)
pH: 6.5 (ref 5.0–8.0)

## 2012-03-15 LAB — LIPASE, BLOOD: Lipase: 45 U/L (ref 11–59)

## 2012-03-15 LAB — COMPREHENSIVE METABOLIC PANEL
ALT: 18 U/L (ref 0–35)
AST: 21 U/L (ref 0–37)
Albumin: 4.3 g/dL (ref 3.5–5.2)
Alkaline Phosphatase: 55 U/L (ref 39–117)
BUN: 15 mg/dL (ref 6–23)
CO2: 27 mEq/L (ref 19–32)
Calcium: 9.7 mg/dL (ref 8.4–10.5)
Chloride: 101 mEq/L (ref 96–112)
Creatinine, Ser: 0.9 mg/dL (ref 0.50–1.10)
GFR calc Af Amer: 87 mL/min — ABNORMAL LOW (ref >90)
GFR calc non Af Amer: 75 mL/min — ABNORMAL LOW (ref >90)
Glucose, Bld: 85 mg/dL (ref 70–99)
Potassium: 3.6 mEq/L (ref 3.5–5.1)
Sodium: 139 mEq/L (ref 135–145)
Total Bilirubin: 0.2 mg/dL — ABNORMAL LOW (ref 0.3–1.2)
Total Protein: 8.2 g/dL (ref 6.0–8.3)

## 2012-03-15 LAB — CBC
HCT: 40.2 % (ref 36.0–46.0)
Hemoglobin: 14.1 g/dL (ref 12.0–15.0)
MCH: 29.1 pg (ref 26.0–34.0)
MCHC: 35.1 g/dL (ref 30.0–36.0)
MCV: 82.9 fL (ref 78.0–100.0)
Platelets: 456 10*3/uL — ABNORMAL HIGH (ref 150–400)
RBC: 4.85 MIL/uL (ref 3.87–5.11)
RDW: 13.2 % (ref 11.5–15.5)
WBC: 18 10*3/uL — ABNORMAL HIGH (ref 4.0–10.5)

## 2012-03-15 LAB — DIFFERENTIAL
Basophils Absolute: 0 10*3/uL (ref 0.0–0.1)
Basophils Relative: 0 % (ref 0–1)
Eosinophils Absolute: 0.2 10*3/uL (ref 0.0–0.7)
Eosinophils Relative: 1 % (ref 0–5)
Lymphocytes Relative: 30 % (ref 12–46)
Lymphs Abs: 5.4 10*3/uL — ABNORMAL HIGH (ref 0.7–4.0)
Monocytes Absolute: 0.5 10*3/uL (ref 0.1–1.0)
Monocytes Relative: 3 % (ref 3–12)
Neutro Abs: 11.9 10*3/uL — ABNORMAL HIGH (ref 1.7–7.7)
Neutrophils Relative %: 66 % (ref 43–77)

## 2012-03-15 MED ORDER — ONDANSETRON HCL 4 MG/2ML IJ SOLN
4.0000 mg | Freq: Once | INTRAMUSCULAR | Status: AC
  Administered 2012-03-15: 4 mg via INTRAVENOUS

## 2012-03-15 MED ORDER — IOHEXOL 300 MG/ML  SOLN
20.0000 mL | INTRAMUSCULAR | Status: AC
  Administered 2012-03-15: 20 mL via ORAL

## 2012-03-15 MED ORDER — ONDANSETRON HCL 4 MG/2ML IJ SOLN
INTRAMUSCULAR | Status: AC
  Filled 2012-03-15: qty 2

## 2012-03-15 MED ORDER — IOHEXOL 300 MG/ML  SOLN
100.0000 mL | Freq: Once | INTRAMUSCULAR | Status: AC | PRN
  Administered 2012-03-15: 100 mL via INTRAVENOUS

## 2012-03-15 MED ORDER — ONDANSETRON HCL 4 MG/2ML IJ SOLN
4.0000 mg | Freq: Once | INTRAMUSCULAR | Status: AC
  Administered 2012-03-15: 4 mg via INTRAVENOUS
  Filled 2012-03-15: qty 2

## 2012-03-15 MED ORDER — SODIUM CHLORIDE 0.9 % IV SOLN
1000.0000 mL | INTRAVENOUS | Status: DC
  Administered 2012-03-15: 1000 mL via INTRAVENOUS

## 2012-03-15 MED ORDER — SODIUM CHLORIDE 0.9 % IV SOLN
1000.0000 mL | Freq: Once | INTRAVENOUS | Status: AC
  Administered 2012-03-15: 1000 mL via INTRAVENOUS

## 2012-03-15 MED ORDER — HYDROMORPHONE HCL PF 1 MG/ML IJ SOLN
1.0000 mg | Freq: Once | INTRAMUSCULAR | Status: AC
  Administered 2012-03-15: 1 mg via INTRAVENOUS
  Filled 2012-03-15: qty 1

## 2012-03-15 NOTE — ED Notes (Signed)
Dr. Ezzard Standing paged

## 2012-03-15 NOTE — ED Provider Notes (Addendum)
This chart was scribed for Celene Kras, MD by Wallis Mart. The patient was seen in room MH12/MH12 and the patient's care was started at 7:07PM.   CSN: [id number]  Arrival date & time 03/15/12  1478   First MD Initiated Contact with Patient 03/15/12 1858     HPI  Savannah Rogers is a 46 y.o. female who presents to the Emergency Department complaining of sudden onset, fluctuating, gradually worsening, moderate to severe epigastric pain onset yesterday afternoon. The pain lessened this morning but worsened again in the evening. The pain radiates nowhere.  Eating worsens the pain. Pt c/o nausea, vomiting, burping, coughing. Pt has not seen anyone for the pain. Denies sore throat, SOB. Denies any prior similar sx, abd. surgeries. Pt has not tried any OTC medications. There are no other associated symptoms and no other alleviating or aggravating factors.   Past Medical History  Diagnosis Date  . Hyperlipidemia     NMR 05-2010:ldl 55(1415/711),HDL 48, TG 173. NO FH of MI.[place] study LDL goal  LDL goal =<160    Past Surgical History  Procedure Date  . Bone marrow biopsy 2006    for elevated WBC  . G3  p1  a2     Family History  Problem Relation Age of Onset  . Stroke Paternal Grandmother   . Stroke Paternal Grandfather   . Hypertension Father   . Hypertension Mother     History  Substance Use Topics  . Smoking status: Never Smoker   . Smokeless tobacco: Not on file  . Alcohol Use: No    OB History    Grav Para Term Preterm Abortions TAB SAB Ect Mult Living                  Review of Systems  HENT: Negative for sore throat.   Respiratory: Positive for cough. Negative for shortness of breath.   Gastrointestinal: Positive for nausea, vomiting and abdominal pain.  All other systems reviewed and are negative.    Allergies  Review of patient's allergies indicates no known allergies.  Home Medications   Current Outpatient Rx  Name Route Sig Dispense Refill  .  ANTARA 130 MG PO CAPS  TAKE ONE CAPSULE BY MOUTH DAILY BEFORE BREAKFAST INPLACE OF WELCHOL 30 capsule 3  . DM-GUAIFENESIN ER 30-600 MG PO TB12 Oral Take 1 tablet by mouth every 12 (twelve) hours. Patient used this medication for congestion.    Marland Kitchen FLUTICASONE PROPIONATE 50 MCG/ACT NA SUSP Nasal Place 2 sprays into the nose daily. 16 g 4    BP 154/79  Pulse 68  Temp(Src) 97.8 F (36.6 C) (Oral)  Resp 16  SpO2 100%  Physical Exam  Nursing note and vitals reviewed. Constitutional: She appears well-developed and well-nourished. No distress.  HENT:  Head: Normocephalic and atraumatic.  Right Ear: External ear normal.  Left Ear: External ear normal.  Eyes: Conjunctivae are normal. Right eye exhibits no discharge. Left eye exhibits no discharge. No scleral icterus.  Neck: Neck supple. No tracheal deviation present.  Cardiovascular: Normal rate, regular rhythm and intact distal pulses.   Pulmonary/Chest: Effort normal and breath sounds normal. No stridor. No respiratory distress. She has no wheezes. She has no rales.  Abdominal: Soft. Bowel sounds are normal. She exhibits no distension. There is tenderness in the epigastric area. There is guarding. There is no rebound.       Tenderness to palpation epigatreum  Musculoskeletal: She exhibits no edema and no tenderness.  Neurological: She is  alert. She has normal strength. No sensory deficit. Cranial nerve deficit:  no gross defecits noted. She exhibits normal muscle tone. She displays no seizure activity. Coordination normal.  Skin: Skin is warm and dry. No rash noted.  Psychiatric: She has a normal mood and affect.    ED Course  Procedures (including critical care time) DIAGNOSTIC STUDIES: Oxygen Saturation is 100% on room air, normal by my interpretation.    COORDINATION OF CARE:    Labs Reviewed  CBC - Abnormal; Notable for the following:    WBC 18.0 (*)    Platelets 456 (*)    All other components within normal limits    DIFFERENTIAL - Abnormal; Notable for the following:    Neutro Abs 11.9 (*)    Lymphs Abs 5.4 (*)    All other components within normal limits  COMPREHENSIVE METABOLIC PANEL - Abnormal; Notable for the following:    Total Bilirubin 0.2 (*)    GFR calc non Af Amer 75 (*)    GFR calc Af Amer 87 (*)    All other components within normal limits  URINALYSIS, ROUTINE W REFLEX MICROSCOPIC - Abnormal; Notable for the following:    Hgb urine dipstick TRACE (*)    Leukocytes, UA TRACE (*)    All other components within normal limits  URINE MICROSCOPIC-ADD ON - Abnormal; Notable for the following:    Bacteria, UA FEW (*)    All other components within normal limits  LIPASE, BLOOD  PREGNANCY, URINE   Ct Abdomen Pelvis W Contrast  03/15/2012  *RADIOLOGY REPORT*  Clinical Data: Right upper quadrant abdominal pain and epigastric pain; nausea and vomiting.  CT ABDOMEN AND PELVIS WITH CONTRAST  Technique:  Multidetector CT imaging of the abdomen and pelvis was performed following the standard protocol during bolus administration of intravenous contrast.  Contrast: 100 mL of Omnipaque 300 IV contrast  Comparison: Pelvic ultrasound performed 03/01/2007  Findings: Mild bibasilar atelectasis is noted.  Vague areas of decreased attenuation within the right hepatic lobe may reflect mild fatty infiltration.  The liver and spleen are otherwise unremarkable in appearance.  The pancreas and adrenal glands are unremarkable.  There is suggestion of mild gallbladder wall thickening, with trace associated pericholecystic fluid.  Minimal associated soft tissue stranding is suggested.  This could reflect very mild cholecystitis.  Right upper quadrant ultrasound could be considered for further evaluation.  No stones are identified on CT.  The kidneys are unremarkable in appearance.  There is no evidence of hydronephrosis.  No renal or ureteral stones are seen.  No perinephric stranding is appreciated.  No free fluid is  identified.  The small bowel is unremarkable in appearance.  The stomach is within normal limits.  No acute vascular abnormalities are seen.  The appendix is normal in caliber, without evidence for appendicitis.  The colon is grossly unremarkable in appearance.  The bladder is mildly distended and within normal limits.  The uterus is grossly unremarkable.  The ovaries are relatively symmetric; no suspicious adnexal masses are seen.  No inguinal lymphadenopathy is seen.  No acute osseous abnormalities are identified.  IMPRESSION:  1.  Suggestion of mild gallbladder wall thickening, with trace associated pericholecystic fluid and suggestion of minimal soft tissue stranding.  This could reflect very mild cholecystitis.  No stones identified on CT; right upper quadrant abdominal ultrasound could be considered for further evaluation, as deemed clinically appropriate. 2.  Suggestion of mild fatty infiltration within the liver.  3.  Mild bibasilar atelectasis  noted.  Original Report Authenticated By: Tonia Ghent, M.D.    MDM  The patient's CT scan suggests  acute cholecystitis. Her exam it in history are also consistent with acute cholecystitis. Patient has been given pain medications but she continues to have tenderness in her upper right quadrant and epigastric region. With her persistent tenderness, leukocytosis, and CT scan findings will consult the general surgeon on call.  I personally performed the services described in this documentation, which was scribed in my presence.  The recorded information has been reviewed and considered.   Celene Kras, MD 03/15/12 2149  Dr Ezzard Standing is in the OR but case discussed with him.  Accepts transfer to [facility] ED.  Celene Kras, MD 03/15/12 [phone]

## 2012-03-15 NOTE — ED Notes (Signed)
XBM:WU13<KG> Expected date:<BR> Expected time:<BR> Means of arrival:<BR> Comments:<BR> [facility] transfer from [facility] to be seen by Dr. Ezzard Standing

## 2012-03-15 NOTE — ED Notes (Signed)
Epigastric pain since yesterday. Vomiting. Has not tried otc medications.

## 2012-03-15 NOTE — H&P (Signed)
Re:   Savannah Rogers DOB:   Dec 31, 1965 MRN:   [id number]  ASSESSMENT AND PLAN: 1.  Cholecystitis.  I discussed with the patient the indications and risks of gall bladder surgery.  The primary risks of gall bladder surgery include, but are not limited to, bleeding, infection, common bile duct injury, and open surgery.  There is also the risk that the patient may have continued symptoms after surgery.  However, the likelihood of improvement in symptoms and return to the patient's normal status is good. We discussed the typical post-operative recovery course. I tried to answer the patient's questions.  Her sister-in-law is with her, Oliva Bustard, and heard all of my discussion.  I will admit the patient tonight. I discussed with her that Dr. Gerrit Friends is our surgeon during the week and will determine the timing of gall bladder surgery.  2. Hypercholesterolemia.  REFERRING PHYSICIAN: Iantha Fallen, MD, [facility]  HISTORY OF PRESENT ILLNESS: Savannah Rogers is a 46 y.o. (DOB: 06/10/1966)  [place] female whose primary care physician is Marga Melnick, MD, MD and came to the [facility] from [facility] with cholecystitis. She had one "attack" in the remote past.  But her problem (pain) began yesterday. She felt a little better and tried to go to work today, but then had increasing pain and nausea and vomiting.  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  There is no family history of GB disease.  She's had no prior abdominal surgery. She did not try to call her primary care doctor, Dr. Alwyn Ren.  No history of stomach disease.  No history of liver disease.  No history of gall bladder disease.  No history of pancreas disease.  No history of colon disease.  She's never had a colonoscopy.    Past Medical History  Diagnosis Date  . Hyperlipidemia     NMR 05-2010:ldl 55(1415/711),HDL 48, TG 173. NO FH of MI.[place] study LDL goal  LDL goal =<160      Past Surgical  History  Procedure Date  . Bone marrow biopsy 2006    for elevated WBC  . G3  p1  a2       Current Facility-Administered Medications  Medication Dose Route Frequency Provider Last Rate Last Dose  . 0.9 %  sodium chloride infusion  1,000 mL Intravenous Once Celene Kras, MD 999 mL/hr at 03/15/12 1948 1,000 mL at 03/15/12 1948   Followed by  . 0.9 %  sodium chloride infusion  1,000 mL Intravenous Continuous Celene Kras, MD 125 mL/hr at 03/15/12 2022 1,000 mL at 03/15/12 2022  . HYDROmorphone (DILAUDID) injection 1 mg  1 mg Intravenous Once Celene Kras, MD   1 mg at 03/15/12 2017  . iohexol (OMNIPAQUE) 300 MG/ML solution 100 mL  100 mL Intravenous Once PRN Medication Radiologist, MD   100 mL at 03/15/12 2126  . iohexol (OMNIPAQUE) 300 MG/ML solution 20 mL  20 mL Oral Q1 Hr x 2 Medication Radiologist, MD   20 mL at 03/15/12 2130  . ondansetron (ZOFRAN) injection 4 mg  4 mg Intravenous Once Celene Kras, MD   4 mg at 03/15/12 1955  . ondansetron (ZOFRAN) injection 4 mg  4 mg Intravenous Once Hurman Horn, MD   4 mg at 03/15/12 2304   Current Outpatient Prescriptions  Medication Sig Dispense Refill  . ANTARA 130 MG capsule TAKE ONE CAPSULE BY MOUTH DAILY BEFORE BREAKFAST INPLACE OF [facility]  30 capsule  3  . dextromethorphan-guaiFENesin (MUCINEX DM) 30-600 MG per 12 hr tablet Take 1 tablet by mouth every 12 (twelve) hours. Patient used this medication for congestion.      . fluticasone (FLONASE) 50 MCG/ACT nasal spray Place 2 sprays into the nose daily.  16 g  4     No Known Allergies  REVIEW OF SYSTEMS: Skin:  No history of rash.  No history of abnormal moles. Infection:  No history of hepatitis or HIV.  No history of MRSA. Neurologic:  No history of stroke.  No history of seizure.  No history of headaches. Cardiac:  No history of hypertension. No history of heart disease.   Pulmonary:  Does not smoke cigarettes.  No asthma or bronchitis.  No OSA/CPAP.  Endocrine:  No diabetes. No thyroid  disease.  Hypercholesterolemia. Gastrointestinal:  See HPI. Urologic:  No history of kidney stones.  No history of bladder infections. Musculoskeletal:  No history of joint or back disease. Hematologic:  No bleeding disorder.  No history of anemia.  Not anticoagulated. Psycho-social:  The patient is oriented.   The patient has no obvious psychologic or social impairment to understanding our conversation and plan.  SOCIAL and FAMILY HISTORY: Married.  Though her husband is in [place] right now.  I am not sure that I understand the whole family dynamics. She has one son who just graduated from [employer]. Her sister-in-law is with her, Oliva Bustard.  PHYSICAL EXAM: BP 161/97  Pulse 63  Temp(Src) 97.9 F (36.6 C) (Oral)  Resp 18  SpO2 100%  LMP 10/22/2011  General: [place] F who is alert and generally healthy appearing.  HEENT: Normal. Pupils equal. Good dentition. Neck: Supple. No mass.  No thyroid mass.  Lymph Nodes:  No supraclavicular or cervical nodes. Lungs: Clear to auscultation and symmetric breath sounds. Heart:  RRR. No murmur or rub.  Abdomen: Soft. No mass.  RUQ pain and discomfort.  Question mass effect in the RUQ.  BS present. Rectal: Not done. Extremities:  Good strength and ROM  in upper and lower extremities. Neurologic:  Grossly intact to motor and sensory function. Psychiatric: Has normal mood and affect. Behavior is normal.   DATA REVIEWED: WBC - 18,000 T Bili - 0.2 Lipase - 45 CT scan 03/15/2012- 1. Suggestion of mild gallbladder wall thickening, with trace associated pericholecystic fluid and suggestion of minimal soft  tissue stranding. This could reflect very mild cholecystitis. No stones identified on CT.  2. Suggestion of mild fatty infiltration within the liver.   3. Mild bibasilar atelectasis noted.    -  JEFFREY Cherly Hensen, M.D.   Ovidio Kin, MD,  [facility] [address] [place]  [address]   [place], [place]  [place]    [address] Phone:  [phone] FAX:  [phone]

## 2012-03-16 ENCOUNTER — Inpatient Hospital Stay (HOSPITAL_COMMUNITY): Payer: BC Managed Care – PPO | Admitting: *Deleted

## 2012-03-16 ENCOUNTER — Encounter (HOSPITAL_COMMUNITY)
Admission: EM | Disposition: A | Discharge status: Home / Self Care | Payer: Self-pay | Source: Home / Self Care | Attending: Emergency Medicine

## 2012-03-16 ENCOUNTER — Encounter (HOSPITAL_COMMUNITY): Payer: Self-pay | Admitting: *Deleted

## 2012-03-16 ENCOUNTER — Inpatient Hospital Stay (HOSPITAL_COMMUNITY): Payer: BC Managed Care – PPO

## 2012-03-16 HISTORY — PX: CHOLECYSTECTOMY: SHX55

## 2012-03-16 LAB — SURGICAL PCR SCREEN
MRSA, PCR: NEGATIVE
Staphylococcus aureus: NEGATIVE

## 2012-03-16 SURGERY — LAPAROSCOPIC CHOLECYSTECTOMY WITH INTRAOPERATIVE CHOLANGIOGRAM
Anesthesia: General | Site: Abdomen | Wound class: Clean Contaminated

## 2012-03-16 MED ORDER — NEOSTIGMINE METHYLSULFATE 1 MG/ML IJ SOLN
INTRAMUSCULAR | Status: DC | PRN
  Administered 2012-03-16: 4 mg via INTRAVENOUS

## 2012-03-16 MED ORDER — LACTATED RINGERS IR SOLN
Status: DC | PRN
  Administered 2012-03-16: 1000 mL

## 2012-03-16 MED ORDER — HYDROMORPHONE HCL PF 1 MG/ML IJ SOLN
1.0000 mg | INTRAMUSCULAR | Status: DC | PRN
  Administered 2012-03-16: 1 mg via INTRAVENOUS
  Filled 2012-03-16 (×2): qty 1

## 2012-03-16 MED ORDER — GLYCOPYRROLATE 0.2 MG/ML IJ SOLN
INTRAMUSCULAR | Status: DC | PRN
  Administered 2012-03-16: .6 mg via INTRAVENOUS

## 2012-03-16 MED ORDER — CEFAZOLIN SODIUM 1-5 GM-% IV SOLN
INTRAVENOUS | Status: AC
  Filled 2012-03-16: qty 50

## 2012-03-16 MED ORDER — ACETAMINOPHEN 10 MG/ML IV SOLN
INTRAVENOUS | Status: DC | PRN
  Administered 2012-03-16: 1000 mg via INTRAVENOUS

## 2012-03-16 MED ORDER — ACETAMINOPHEN 325 MG PO TABS: 650.0000 mg | ORAL_TABLET | ORAL | Status: DC | PRN

## 2012-03-16 MED ORDER — BUPIVACAINE-EPINEPHRINE 0.5% -1:200000 IJ SOLN
INTRAMUSCULAR | Status: DC | PRN
  Administered 2012-03-16: 20 mL

## 2012-03-16 MED ORDER — BUPIVACAINE-EPINEPHRINE (PF) 0.5% -1:200000 IJ SOLN
INTRAMUSCULAR | Status: AC
  Filled 2012-03-16: qty 10

## 2012-03-16 MED ORDER — ONDANSETRON HCL 4 MG/2ML IJ SOLN: 4.0000 mg | Freq: Four times a day (QID) | INTRAMUSCULAR | Status: DC | PRN

## 2012-03-16 MED ORDER — LACTATED RINGERS IV SOLN: INTRAVENOUS | Status: DC

## 2012-03-16 MED ORDER — CEFAZOLIN SODIUM 1-5 GM-% IV SOLN
1.0000 g | Freq: Three times a day (TID) | INTRAVENOUS | Status: DC
  Administered 2012-03-16 (×3): 1 g via INTRAVENOUS
  Filled 2012-03-16 (×5): qty 50

## 2012-03-16 MED ORDER — ACETAMINOPHEN 10 MG/ML IV SOLN
INTRAVENOUS | Status: AC
  Filled 2012-03-16: qty 100

## 2012-03-16 MED ORDER — DEXAMETHASONE SODIUM PHOSPHATE 10 MG/ML IJ SOLN
INTRAMUSCULAR | Status: DC | PRN
  Administered 2012-03-16: 10 mg via INTRAVENOUS

## 2012-03-16 MED ORDER — HYDROMORPHONE HCL PF 1 MG/ML IJ SOLN
INTRAMUSCULAR | Status: AC
  Filled 2012-03-16: qty 1

## 2012-03-16 MED ORDER — HYDROMORPHONE HCL PF 1 MG/ML IJ SOLN
0.2500 mg | INTRAMUSCULAR | Status: DC | PRN
  Administered 2012-03-16 (×4): 0.5 mg via INTRAVENOUS

## 2012-03-16 MED ORDER — HYDROCODONE-ACETAMINOPHEN 5-325 MG PO TABS: 1.0000 | ORAL_TABLET | ORAL | Status: DC | PRN

## 2012-03-16 MED ORDER — KCL IN DEXTROSE-NACL 20-5-0.45 MEQ/L-%-% IV SOLN
INTRAVENOUS | Status: DC
  Administered 2012-03-16: via INTRAVENOUS
  Filled 2012-03-16 (×5): qty 1000

## 2012-03-16 MED ORDER — LACTATED RINGERS IV SOLN
INTRAVENOUS | Status: DC | PRN
  Administered 2012-03-16: 11:00:00 via INTRAVENOUS

## 2012-03-16 MED ORDER — PROMETHAZINE HCL 25 MG/ML IJ SOLN
12.5000 mg | Freq: Four times a day (QID) | INTRAMUSCULAR | Status: DC | PRN
  Administered 2012-03-16: 12.5 mg via INTRAVENOUS
  Filled 2012-03-16: qty 1

## 2012-03-16 MED ORDER — ONDANSETRON HCL 4 MG PO TABS: 4.0000 mg | ORAL_TABLET | Freq: Four times a day (QID) | ORAL | Status: DC | PRN

## 2012-03-16 MED ORDER — ROCURONIUM BROMIDE 100 MG/10ML IV SOLN
INTRAVENOUS | Status: DC | PRN
  Administered 2012-03-16: 10 mg via INTRAVENOUS
  Administered 2012-03-16: 20 mg via INTRAVENOUS

## 2012-03-16 MED ORDER — 0.9 % SODIUM CHLORIDE (POUR BTL) OPTIME
TOPICAL | Status: DC | PRN
  Administered 2012-03-16: 1000 mL

## 2012-03-16 MED ORDER — SUCCINYLCHOLINE CHLORIDE 20 MG/ML IJ SOLN
INTRAMUSCULAR | Status: DC | PRN
  Administered 2012-03-16: 100 mg via INTRAVENOUS

## 2012-03-16 MED ORDER — FENTANYL CITRATE 0.05 MG/ML IJ SOLN
INTRAMUSCULAR | Status: DC | PRN
  Administered 2012-03-16: 100 ug via INTRAVENOUS
  Administered 2012-03-16 (×2): 50 ug via INTRAVENOUS

## 2012-03-16 MED ORDER — IOHEXOL 300 MG/ML  SOLN
INTRAMUSCULAR | Status: AC
  Filled 2012-03-16: qty 1

## 2012-03-16 MED ORDER — MORPHINE SULFATE 2 MG/ML IJ SOLN: 1.0000 mg | INTRAMUSCULAR | Status: DC | PRN

## 2012-03-16 MED ORDER — KCL IN DEXTROSE-NACL 20-5-0.45 MEQ/L-%-% IV SOLN
INTRAVENOUS | Status: DC
  Filled 2012-03-16 (×4): qty 1000

## 2012-03-16 MED ORDER — EPHEDRINE SULFATE 50 MG/ML IJ SOLN
INTRAMUSCULAR | Status: DC | PRN
  Administered 2012-03-16 (×2): 5 mg via INTRAVENOUS

## 2012-03-16 MED ORDER — PROPOFOL 10 MG/ML IV EMUL
INTRAVENOUS | Status: DC | PRN
  Administered 2012-03-16: 150 mg via INTRAVENOUS

## 2012-03-16 MED ORDER — MIDAZOLAM HCL 5 MG/5ML IJ SOLN
INTRAMUSCULAR | Status: DC | PRN
  Administered 2012-03-16: 2 mg via INTRAVENOUS

## 2012-03-16 MED ORDER — IOHEXOL 300 MG/ML  SOLN
INTRAMUSCULAR | Status: DC | PRN
  Administered 2012-03-16: 12 mL

## 2012-03-16 MED ORDER — LIDOCAINE HCL (CARDIAC) 20 MG/ML IV SOLN
INTRAVENOUS | Status: DC | PRN
  Administered 2012-03-16: 100 mg via INTRAVENOUS

## 2012-03-16 MED ORDER — ONDANSETRON HCL 4 MG/2ML IJ SOLN
INTRAMUSCULAR | Status: DC | PRN
  Administered 2012-03-16: 4 mg via INTRAVENOUS

## 2012-03-16 SURGICAL SUPPLY — 42 items
APPLIER CLIP ROT 10 11.4 M/L (STAPLE) ×2
BENZOIN TINCTURE PRP APPL 2/3 (GAUZE/BANDAGES/DRESSINGS) ×2 IMPLANT
CABLE HIGH FREQUENCY MONO STRZ (ELECTRODE) ×2 IMPLANT
CANISTER SUCTION 2500CC (MISCELLANEOUS) ×2 IMPLANT
CHLORAPREP W/TINT 26ML (MISCELLANEOUS) ×2 IMPLANT
CLIP APPLIE ROT 10 11.4 M/L (STAPLE) ×1 IMPLANT
CLOTH BEACON ORANGE TIMEOUT ST (SAFETY) ×2 IMPLANT
COVER MAYO STAND STRL (DRAPES) ×2 IMPLANT
COVER SURGICAL LIGHT HANDLE (MISCELLANEOUS) ×2 IMPLANT
DECANTER SPIKE VIAL GLASS SM (MISCELLANEOUS) IMPLANT
DRAPE C-ARM 42X72 X-RAY (DRAPES) ×2 IMPLANT
DRAPE LAPAROSCOPIC ABDOMINAL (DRAPES) ×2 IMPLANT
DRAPE UTILITY 15X26 (DRAPE) ×2 IMPLANT
ELECT REM PT RETURN 9FT ADLT (ELECTROSURGICAL) ×2
ELECTRODE REM PT RTRN 9FT ADLT (ELECTROSURGICAL) ×1 IMPLANT
GAUZE SPONGE 2X2 8PLY STRL LF (GAUZE/BANDAGES/DRESSINGS) ×1 IMPLANT
GLOVE BIOGEL PI IND STRL 7.0 (GLOVE) ×1 IMPLANT
GLOVE BIOGEL PI INDICATOR 7.0 (GLOVE) ×1
GLOVE SURG ORTHO 8.0 STRL STRW (GLOVE) ×2 IMPLANT
GOWN STRL NON-REIN LRG LVL3 (GOWN DISPOSABLE) ×2 IMPLANT
GOWN STRL REIN XL XLG (GOWN DISPOSABLE) ×6 IMPLANT
HEMOSTAT SURGICEL 4X8 (HEMOSTASIS) IMPLANT
KIT BASIN OR (CUSTOM PROCEDURE TRAY) ×2 IMPLANT
NS IRRIG 1000ML POUR BTL (IV SOLUTION) ×2 IMPLANT
POUCH SPECIMEN RETRIEVAL 10MM (ENDOMECHANICALS) ×2 IMPLANT
SCISSORS LAP 5X35 DISP (ENDOMECHANICALS) ×2 IMPLANT
SET CHOLANGIOGRAPH MIX (MISCELLANEOUS) ×2 IMPLANT
SET IRRIG TUBING LAPAROSCOPIC (IRRIGATION / IRRIGATOR) ×2 IMPLANT
SLEEVE Z-THREAD 5X100MM (TROCAR) IMPLANT
SOLUTION ANTI FOG 6CC (MISCELLANEOUS) ×2 IMPLANT
SPONGE GAUZE 2X2 STER 10/PKG (GAUZE/BANDAGES/DRESSINGS) ×1
STRIP CLOSURE SKIN 1/2X4 (GAUZE/BANDAGES/DRESSINGS) ×2 IMPLANT
SUT MNCRL AB 4-0 PS2 18 (SUTURE) ×2 IMPLANT
TAPE CLOTH SURG 4X10 WHT LF (GAUZE/BANDAGES/DRESSINGS) ×2 IMPLANT
TOWEL OR 17X26 10 PK STRL BLUE (TOWEL DISPOSABLE) ×4 IMPLANT
TRAY LAP CHOLE (CUSTOM PROCEDURE TRAY) ×2 IMPLANT
TROCAR XCEL BLADELESS 5X75MML (TROCAR) ×4 IMPLANT
TROCAR XCEL BLUNT TIP 100MML (ENDOMECHANICALS) ×2 IMPLANT
TROCAR XCEL NON-BLD 11X100MML (ENDOMECHANICALS) ×2 IMPLANT
TROCAR Z-THREAD FIOS 11X100 BL (TROCAR) IMPLANT
TROCAR Z-THREAD FIOS 5X100MM (TROCAR) IMPLANT
TUBING INSUFFLATION 10FT LAP (TUBING) ×2 IMPLANT

## 2012-03-16 NOTE — Progress Notes (Signed)
UR completed 

## 2012-03-16 NOTE — Progress Notes (Signed)
General Surgery [facility] - Attending  Patient seen and examined.  Reviewed indications for surgery with patient and family at bedside.  All questions answered.  They wish to proceed with surgery this morning.  The risks and benefits of the procedure have been discussed at length with the patient.  The patient understands the proposed procedure, potential alternative treatments, and the course of recovery to be expected.  All of the patient's questions have been answered at this time.  The patient wishes to proceed with surgery.  Velora Heckler, MD, [facility] Office: [phone]

## 2012-03-16 NOTE — Op Note (Signed)
Laparoscopic Cholecystectomy with IOC Procedure Note  Pre-operative Diagnosis: Acute cholecystitis  Post-operative Diagnosis: Same  Surgeon:  Velora Heckler, MD, FACS  Assistant:  none   Anesthesia:  General  Indications: This patient presents with symptomatic gallbladder disease and will undergo laparoscopic cholecystectomy.  Procedure Details  The patient was seen again in the Holding Room. The risks, benefits, complications, treatment options, and expected outcomes were discussed with the patient. The patient and/or family concurred with the proposed plan, giving informed consent.  The patient was taken to Operating Room, identified as Savannah Rogers and the procedure verified as Laparoscopic Cholecystectomy with Intraoperative Cholangiograms. A Time Out was held and the above information confirmed.  Prior to the induction of general anesthesia, antibiotic prophylaxis was administered. General endotracheal anesthesia was then administered and tolerated well. After the induction, the abdomen was prepped in the usual aseptic fashion. The patient was positioned in the supine position with some reverse Trendelenburg.  An incision was made in the skin near the umbilicus. The midline fascia was incised and the Hasson canula was introduced under direct vision. It was secured with a pursestring 0-Vicryl suture placed in the usual fashion. Pneumoperitoneum was then created with CO2 and tolerated well without any adverse changes in the patient's vital signs. Additional trocars were introduced under direct vision along the right costal margin in the midline, mid-clavicular line, and mid-axillary line.  The gallbladder was identified and the fundus grasped and retracted cephalad. Adhesions were lysed bluntly and with the electrocautery where needed, taking care not to injure any adjacent structures. The infundibulum was grasped and retracted laterally, exposing the peritoneum overlying the triangle of  Calot. This was then divided and exposed in a blunt fashion. The cystic duct was clearly identified and bluntly dissected circumferentially.  An incision was made in the cystic duct and the cholangiogram catheter introduced. The catheter was secured using an ligaclip.  Real-time cholangiography was performed using the C-arm.  There was rapid filling of a normal caliber common bile duct.  There was reflux of contrast into the left and right hepatic ductal systems.  There was free flow distally into the duodenum without filling defect or obstruction.  Catheter was removed from the peritoneal cavity.  The cystic duct was then triply ligated with surgical clips on the patient side and singly clipped on the gallbladder side and divided. The cystic artery was identified, dissected free, ligated with clips and divided as well.   The gallbladder was dissected from the liver bed with the electrocautery used for hemostasis. The gallbladder was completely removed and placed into an endocatch bag. The right upper quadrant was irrigated and inspected. Hemostasis was achieved with the electrocautery. Copious irrigation was utilized and was repeatedly aspirated until clear.  Pneumoperitoneum was released after viewing removal of the trocars with good hemostasis. The umbilical wound was irrigated and the fascia was then closed with the pursestring suture; the skin was then closed with 4-0 Monocril subcuticular sutures and a sterile dressing was applied.  Instrument, sponge, and needle counts were correct at closure and at the conclusion of the case.  Velora Heckler, MD, FACS General & Endocrine Surgery [facility]   Findings: Cholecystitis without Cholelithiasis  Estimated Blood Loss: Minimal         Drains: none         Specimens: Gallbladder to pathology       Complications: None; patient tolerated the procedure well.         Disposition:  PACU - hemodynamically stable.           Condition: stable

## 2012-03-16 NOTE — Anesthesia Preprocedure Evaluation (Addendum)
Anesthesia Evaluation  Patient identified by MRN, date of birth, ID band Patient awake    Reviewed: Allergy & Precautions, H&P , NPO status , Patient's Chart, lab work & pertinent test results, reviewed documented beta blocker date and time   Airway Mallampati: II TM Distance: >3 FB Neck ROM: full    Dental  (+) Dental Advisory Given and Chipped,    Pulmonary neg pulmonary ROS,  breath sounds clear to auscultation  Pulmonary exam normal       Cardiovascular Exercise Tolerance: Good negative cardio ROS  Rhythm:regular Rate:Normal     Neuro/Psych negative neurological ROS  negative psych ROS   GI/Hepatic negative GI ROS, Neg liver ROS,   Endo/Other  negative endocrine ROS  Renal/GU negative Renal ROS  negative genitourinary   Musculoskeletal   Abdominal   Peds  Hematology negative hematology ROS (+)   Anesthesia Other Findings   Reproductive/Obstetrics negative OB ROS                          Anesthesia Physical Anesthesia Plan  ASA: I  Anesthesia Plan: General   Post-op Pain Management:    Induction: Intravenous  Airway Management Planned: Oral ETT  Additional Equipment:   Intra-op Plan:   Post-operative Plan: Extubation in OR  Informed Consent: I have reviewed the patients History and Physical, chart, labs and discussed the procedure including the risks, benefits and alternatives for the proposed anesthesia with the patient or authorized representative who has indicated his/her understanding and acceptance.   Dental Advisory Given  Plan Discussed with: CRNA and Surgeon  Anesthesia Plan Comments:         Anesthesia Quick Evaluation

## 2012-03-16 NOTE — Transfer of Care (Signed)
Immediate Anesthesia Transfer of Care Note  Patient: Savannah Rogers  Procedure(s) Performed: Procedure(s) (LRB): LAPAROSCOPIC CHOLECYSTECTOMY WITH INTRAOPERATIVE CHOLANGIOGRAM (N/A)  Patient Location: PACU  Anesthesia Type: General  Level of Consciousness: awake and sedated  Airway & Oxygen Therapy: Patient Spontanous Breathing and Patient connected to face mask oxygen  Post-op Assessment: Report given to PACU RN and Post -op Vital signs reviewed and stable  Post vital signs: Reviewed and stable  Complications: No apparent anesthesia complications

## 2012-03-16 NOTE — Anesthesia Postprocedure Evaluation (Signed)
  Anesthesia Post-op Note  Patient: Savannah Rogers  Procedure(s) Performed: Procedure(s) (LRB): LAPAROSCOPIC CHOLECYSTECTOMY WITH INTRAOPERATIVE CHOLANGIOGRAM (N/A)  Patient Location: PACU  Anesthesia Type: General  Level of Consciousness: awake and alert   Airway and Oxygen Therapy: Patient Spontanous Breathing  Post-op Pain: mild  Post-op Assessment: Post-op Vital signs reviewed, Patient's Cardiovascular Status Stable, Respiratory Function Stable, Patent Airway and No signs of Nausea or vomiting  Post-op Vital Signs: stable  Complications: No apparent anesthesia complications

## 2012-03-16 NOTE — Preoperative (Signed)
Beta Blockers   Reason not to administer Beta Blockers:Not Applicable 

## 2012-03-16 NOTE — ED Notes (Signed)
Attempted to call report and nurse said shell call back

## 2012-03-16 NOTE — Progress Notes (Signed)
Subjective: Still having pain RUQ.  No nausea/vomiting since she completed contrast.  Objective: Vital signs in last 24 hours: Temp:  [97.4 F (36.3 C)-98 F (36.7 C)] 98 F (36.7 C) (04/16 0627) Pulse Rate:  [53-68] 53  (04/16 0627) Resp:  [16-18] 18  (04/16 0627) BP: (138-161)/(70-97) 141/76 mmHg (04/16 0627) SpO2:  [99 %-100 %] 100 % (04/16 0627) Weight:  [67.6 kg (149 lb 0.5 oz)] 67.6 kg (149 lb 0.5 oz) (04/16 0100) Last BM Date: 03/15/12 Afebrile, VS OK Bp was up last PM better now. Intake/Output from previous day: 04/15 0701 - 04/16 0700 In: -  Out: 1 [Urine:1] Intake/Output this shift:    General appearance: alert, cooperative and no distress GI: soft, tender RUQ, +BS no distension.  Lab Results:   [facility] 03/15/12 1944  WBC 18.0*  HGB 14.1  HCT 40.2  PLT 456*    BMET  Basename 03/15/12 1944  NA 139  K 3.6  CL 101  CO2 27  GLUCOSE 85  BUN 15  CREATININE 0.90  CALCIUM 9.7   PT/INR No results found for this basename: LABPROT:2,INR:2 in the last 72 hours   Lab 03/15/12 1944  AST 21  ALT 18  ALKPHOS 55  BILITOT 0.2*  PROT 8.2  ALBUMIN 4.3     Lipase     Component Value Date/Time   LIPASE 45 03/15/2012 1944     Studies/Results: Ct Abdomen Pelvis W Contrast  03/15/2012  *RADIOLOGY REPORT*  Clinical Data: Right upper quadrant abdominal pain and epigastric pain; nausea and vomiting.  CT ABDOMEN AND PELVIS WITH CONTRAST  Technique:  Multidetector CT imaging of the abdomen and pelvis was performed following the standard protocol during bolus administration of intravenous contrast.  Contrast: 100 mL of Omnipaque 300 IV contrast  Comparison: Pelvic ultrasound performed 03/01/2007  Findings: Mild bibasilar atelectasis is noted.  Vague areas of decreased attenuation within the right hepatic lobe may reflect mild fatty infiltration.  The liver and spleen are otherwise unremarkable in appearance.  The pancreas and adrenal glands are unremarkable.  There  is suggestion of mild gallbladder wall thickening, with trace associated pericholecystic fluid.  Minimal associated soft tissue stranding is suggested.  This could reflect very mild cholecystitis.  Right upper quadrant ultrasound could be considered for further evaluation.  No stones are identified on CT.  The kidneys are unremarkable in appearance.  There is no evidence of hydronephrosis.  No renal or ureteral stones are seen.  No perinephric stranding is appreciated.  No free fluid is identified.  The small bowel is unremarkable in appearance.  The stomach is within normal limits.  No acute vascular abnormalities are seen.  The appendix is normal in caliber, without evidence for appendicitis.  The colon is grossly unremarkable in appearance.  The bladder is mildly distended and within normal limits.  The uterus is grossly unremarkable.  The ovaries are relatively symmetric; no suspicious adnexal masses are seen.  No inguinal lymphadenopathy is seen.  No acute osseous abnormalities are identified.  IMPRESSION:  1.  Suggestion of mild gallbladder wall thickening, with trace associated pericholecystic fluid and suggestion of minimal soft tissue stranding.  This could reflect very mild cholecystitis.  No stones identified on CT; right upper quadrant abdominal ultrasound could be considered for further evaluation, as deemed clinically appropriate. 2.  Suggestion of mild fatty infiltration within the liver.  3.  Mild bibasilar atelectasis noted.  Original Report Authenticated By: Tonia Ghent, M.D.    Medications:    .  sodium chloride  1,000 mL Intravenous Once  .  ceFAZolin (ANCEF) IV  1 g Intravenous Q8H  . HYDROmorphone  1 mg Intravenous Once  . iohexol  20 mL Oral Q1 Hr x 2  . ondansetron  4 mg Intravenous Once  . ondansetron (ZOFRAN) IV  4 mg Intravenous Once    Assessment/Plan Cholecystitis Hypercholesterolemia.   Plan:  Surgery for later today.  Risk discussed pt and family member had no  further questions.    LOS: 1 day    Kanan Sobek 03/16/2012

## 2012-03-17 MED ORDER — HYDROCODONE-ACETAMINOPHEN 5-325 MG PO TABS: 1.0000 | ORAL_TABLET | Freq: Four times a day (QID) | ORAL | Status: AC | PRN

## 2012-03-17 MED ORDER — ACETAMINOPHEN 325 MG PO TABS: 650.0000 mg | ORAL_TABLET | ORAL | Status: AC | PRN

## 2012-03-17 NOTE — Discharge Summary (Signed)
Physician Discharge Summary  Patient ID: Savannah Rogers MRN: [id number] DOB/AGE: 47/01/1966 47 y.o.  Admit date: 03/15/2012 Discharge date: 03/17/2012  Admission Diagnoses: Cholecystitis  Hypercholesterolemia    Discharge Diagnoses: Same Active Problems:  * No active hospital problems. *    PROCEDURES: Laparoscopic Cholecystectomy with [facility] 03/16/12 DR. [facility] Course:Savannah Rogers is a 46 y.o. (DOB: 03-11-1966) [place] female whose primary care physician is Marga Melnick, MD, MD and came [facility] the [facility] from [facility] with cholecystitis. She had one "attack" in the remote past. But her problem (pain) began yesterday. She felt a little better and tried to go to work today, but then had increasing pain and nausea and vomiting. 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 There is no family history of GB disease. She's had no prior abdominal surgery. She did not try to call her primary care doctor, Dr. Alwyn Ren.  No history of stomach disease. No history of liver disease. No history of gall bladder disease. No history of pancreas disease. No history of colon disease. She's never had a colonoscopy.  She was admitted by DR. Newman and she was taken to the OR the following day by DR. Gerkin.  Pt tolerated the procedure well and was ambulating, eating and drinking normally the following day.  She was ready for discharge at that time.  Follow up:  Dr. Gerrit Friends 2 weeks/ Dr. Alwyn Ren PRN  Condition on D/C: improved   Disposition: Final discharge disposition not confirmed Home   Medication List  As of 03/17/2012  9:59 AM   TAKE these medications         acetaminophen 325 MG tablet   Commonly known as: TYLENOL   Take 2 tablets (650 mg total) by mouth every 4 (four) hours as needed.      ANTARA 130 MG capsule   Generic drug: fenofibrate micronized   TAKE ONE CAPSULE BY MOUTH DAILY BEFORE BREAKFAST INPLACE OF WELCHOL      dextromethorphan-guaiFENesin  30-600 MG per 12 hr tablet   Commonly known as: MUCINEX DM   Take 1 tablet by mouth every 12 (twelve) hours. Patient used this medication for congestion.      fluticasone 50 MCG/ACT nasal spray   Commonly known as: FLONASE   Place 2 sprays into the nose daily.      HYDROcodone-acetaminophen 5-325 MG per tablet   Commonly known as: NORCO   Take 1-2 tablets by mouth every 6 (six) hours as needed.           Follow-up Information    Follow up with Marga Melnick, MD. Schedule an appointment as soon as possible for a visit in 2 weeks. (As needed)    Contact information:   [address] [facility] [address] [place] [address] [phone]       Follow up with Velora Heckler, MD. Schedule an appointment as soon as possible for a visit in 2 weeks.   Contact information:   [employer] [address], [address] [place] [address] [phone]          Signed: Sherrie George 03/17/2012, 9:59 AM

## 2012-03-17 NOTE — Discharge Summary (Signed)
General Surgery [facility] - Attending  Doing well post op.  Agree with plans for discharge.  Will see in [facility] office in about 2 weeks for follow up.  Velora Heckler, MD, [facility] Office: [phone]

## 2012-03-17 NOTE — Discharge Instructions (Signed)
Laparoscopic Cholecystectomy Laparoscopic cholecystectomy is surgery to remove the gallbladder. The gallbladder is located slightly to the right of center in the abdomen, behind the liver. It is a concentrating and storage sac for the bile produced in the liver. Bile aids in the digestion and absorption of fats. Gallbladder disease (cholecystitis) is an inflammation of your gallbladder. This condition is usually caused by a buildup of gallstones (cholelithiasis) in your gallbladder. Gallstones can block the flow of bile, resulting in inflammation and pain. In severe cases, emergency surgery may be required. When emergency surgery is not required, you will have time to prepare for the procedure. Laparoscopic surgery is an alternative to open surgery. Laparoscopic surgery usually has a shorter recovery time. Your common bile duct may also need to be examined and explored. Your caregiver will discuss this with you if he or she feels this should be done. If stones are found in the common bile duct, they may be removed. LET YOUR CAREGIVER KNOW ABOUT:  Allergies to food or medicine.   Medicines taken, including vitamins, herbs, eyedrops, over-the-counter medicines, and creams.   Use of steroids (by mouth or creams).   Previous problems with anesthetics or numbing medicines.   History of bleeding problems or blood clots.   Previous surgery.   Other health problems, including diabetes and kidney problems.   Possibility of pregnancy, if this applies.  RISKS AND COMPLICATIONS All surgery is associated with risks. Some problems that may occur following this procedure include:  Infection.   Damage to the common bile duct, nerves, arteries, veins, or other internal organs such as the stomach or intestines.   Bleeding.   A stone may remain in the common bile duct.  BEFORE THE PROCEDURE  Do not take aspirin for 3 days prior to surgery or blood thinners for 1 week prior to surgery.   Do not eat or  drink anything after midnight the night before surgery.   Let your caregiver know if you develop a cold or other infectious problem prior to surgery.   You should be present 60 minutes before the procedure or as directed.  PROCEDURE  You will be given medicine that makes you sleep (general anesthetic). When you are asleep, your surgeon will make several small cuts (incisions) in your abdomen. One of these incisions is used to insert a small, lighted scope (laparoscope) into the abdomen. The laparoscope helps the surgeon see into your abdomen. Carbon dioxide gas will be pumped into your abdomen. The gas allows more room for the surgeon to perform your surgery. Other operating instruments are inserted through the other incisions. Laparoscopic procedures may not be appropriate when:  There is major scarring from previous surgery.   The gallbladder is extremely inflamed.   There are bleeding disorders or unexpected cirrhosis of the liver.   A pregnancy is near term.   Other conditions make the laparoscopic procedure impossible.  If your surgeon feels it is not safe to continue with a laparoscopic procedure, he or she will perform an open abdominal procedure. In this case, the surgeon will make an incision to open the abdomen. This gives the surgeon a larger view and field to work within. This may allow the surgeon to perform procedures that sometimes cannot be performed with a laparoscope alone. Open surgery has a longer recovery time. AFTER THE PROCEDURE  You will be taken to the recovery area where a nurse will watch and check your progress.   You may be allowed to go home   the same day.   Do not resume physical activities until directed by your caregiver.   You may resume a normal diet and activities as directed.  Document Released: 11/17/2005 Document Revised: 11/06/2011 Document Reviewed: 05/02/2011 ExitCare Patient Information 2012 [employer].CCS ______[facility] LAPAROSCOPIC SURGERY: POST OP INSTRUCTIONS Always review your discharge instruction sheet given to you by the facility where your surgery was performed. IF YOU HAVE DISABILITY OR FAMILY LEAVE FORMS, YOU MUST BRING THEM TO THE OFFICE FOR PROCESSING.   DO NOT GIVE THEM TO YOUR DOCTOR.  1. A prescription for pain medication may be given to you upon discharge.  Take your pain medication as prescribed, if needed.  If narcotic pain medicine is not needed, then you may take acetaminophen (Tylenol) or ibuprofen (Advil) as needed. 2. Take your usually prescribed medications unless otherwise directed. 3. If you need a refill on your pain medication, please contact your pharmacy.  They will contact our office to request authorization. Prescriptions will not be filled after 5pm or on week-ends. 4. You should follow a light diet the first few days after arrival home, such as soup and crackers, etc.  Be sure to include lots of fluids daily. 5. Most patients will experience some swelling and bruising in the area of the incisions.  Ice packs will help.  Swelling and bruising can take several days to resolve.  6. It is common to experience some constipation if taking pain medication after surgery.  Increasing fluid intake and taking a stool softener (such as Colace) will usually help or prevent this problem from occurring.  A mild laxative (Milk of Magnesia or Miralax) should be taken according to package instructions if there are no bowel movements after 48 hours. 7. Unless discharge instructions indicate otherwise, you may remove your bandages 24-48 hours after surgery, and you may shower at that time.  You may have steri-strips (small skin tapes) in place directly over the incision.  These strips should be left on the skin for 7-10 days.  If your surgeon used skin glue on the incision, you may shower in 24 hours.  The glue will flake off over the next 2-3 weeks.  Any sutures or staples will be removed at the  office during your follow-up visit. 8. ACTIVITIES:  You may resume regular (light) daily activities beginning the next day--such as daily self-care, walking, climbing stairs--gradually increasing activities as tolerated.  You may have sexual intercourse when it is comfortable.  Refrain from any heavy lifting or straining until approved by your doctor. a. You may drive when you are no longer taking prescription pain medication, you can comfortably wear a seatbelt, and you can safely maneuver your car and apply brakes. b. RETURN TO WORK:  __________________________________________________________ 9. You should see your doctor in the office for a follow-up appointment approximately 2-3 weeks after your surgery.  Make sure that you call for this appointment within a day or two after you arrive home to insure a convenient appointment time. 10. OTHER INSTRUCTIONS: __________________________________________________________________________________________________________________________ __________________________________________________________________________________________________________________________ WHEN TO CALL YOUR DOCTOR: 1. Fever over 101.0 2. Inability to urinate 3. Continued bleeding from incision. 4. Increased pain, redness, or drainage from the incision. 5. Increasing abdominal pain  The clinic staff is available to answer your questions during regular business hours.  Please don't hesitate to call and ask to speak to one of the nurses for clinical concerns.  If you have a medical emergency, go to the nearest emergency room or call 911.    A surgeon from [facility] is always on call at the hospital. [address], [address], [place], [place]  [address] ? [address] [address], [place],    [address] [phone] ? [phone] ? FAX [phone] Web site: [email or web address]  

## 2012-03-17 NOTE — Progress Notes (Signed)
General Surgery [facility] - Attending  Agree.  Doing well post op.  Velora Heckler, MD, [facility] Office: [phone]

## 2012-03-17 NOTE — Progress Notes (Signed)
1 Day Post-Op  Subjective: Eating regular breakfast, walked in halls, ready to go home  Objective: Vital signs in last 24 hours: Temp:  [97.2 F (36.2 C)-98.4 F (36.9 C)] 97.9 F (36.6 C) (04/16 2250) Pulse Rate:  [49-67] 67  (04/16 2250) Resp:  [14-20] 18  (04/16 2250) BP: (107-142)/(58-86) 114/60 mmHg (04/16 2250) SpO2:  [94 %-100 %] 97 % (04/16 2250) Last BM Date: 03/15/12 Afebrile VSS, Intake/Output from previous day: 04/16 0701 - 04/17 0700 In: 2017.5 [P.O.:480; I.V.:1537.5] Out: -  Intake/Output this shift:    General appearance: alert, cooperative and no distress GI: soft, non-tender; bowel sounds normal; no masses,  no organomegaly and still tender, but wounds look good.  Lab Results:   [facility] 03/15/12 1944  WBC 18.0*  HGB 14.1  HCT 40.2  PLT 456*    BMET  Basename 03/15/12 1944  NA 139  K 3.6  CL 101  CO2 27  GLUCOSE 85  BUN 15  CREATININE 0.90  CALCIUM 9.7   PT/INR No results found for this basename: LABPROT:2,INR:2 in the last 72 hours   Lab 03/15/12 1944  AST 21  ALT 18  ALKPHOS 55  BILITOT 0.2*  PROT 8.2  ALBUMIN 4.3     Lipase     Component Value Date/Time   LIPASE 45 03/15/2012 1944     Studies/Results: Dg Cholangiogram Operative  03/16/2012  *RADIOLOGY REPORT*  Clinical Data:   Cholecystitis.  INTRAOPERATIVE CHOLANGIOGRAM  Technique:  Cholangiographic images from the C-arm fluoroscopic device were submitted for interpretation post-operatively.  Please see the procedural report for the amount of contrast and the fluoroscopy time utilized.  Comparison:  Abdominal CT 03/15/2012  Findings:  Contrast fills the intrahepatic and extrahepatic biliary system.  No filling defects to suggest stones.  Contrast fills the duodenum.  Normal caliber of the intrahepatic bile ducts.  IMPRESSION: Patent biliary system without obstructing stones.  Original Report Authenticated By: Richarda Overlie, M.D.   Ct Abdomen Pelvis W Contrast  03/15/2012   *RADIOLOGY REPORT*  Clinical Data: Right upper quadrant abdominal pain and epigastric pain; nausea and vomiting.  CT ABDOMEN AND PELVIS WITH CONTRAST  Technique:  Multidetector CT imaging of the abdomen and pelvis was performed following the standard protocol during bolus administration of intravenous contrast.  Contrast: 100 mL of Omnipaque 300 IV contrast  Comparison: Pelvic ultrasound performed 03/01/2007  Findings: Mild bibasilar atelectasis is noted.  Vague areas of decreased attenuation within the right hepatic lobe may reflect mild fatty infiltration.  The liver and spleen are otherwise unremarkable in appearance.  The pancreas and adrenal glands are unremarkable.  There is suggestion of mild gallbladder wall thickening, with trace associated pericholecystic fluid.  Minimal associated soft tissue stranding is suggested.  This could reflect very mild cholecystitis.  Right upper quadrant ultrasound could be considered for further evaluation.  No stones are identified on CT.  The kidneys are unremarkable in appearance.  There is no evidence of hydronephrosis.  No renal or ureteral stones are seen.  No perinephric stranding is appreciated.  No free fluid is identified.  The small bowel is unremarkable in appearance.  The stomach is within normal limits.  No acute vascular abnormalities are seen.  The appendix is normal in caliber, without evidence for appendicitis.  The colon is grossly unremarkable in appearance.  The bladder is mildly distended and within normal limits.  The uterus is grossly unremarkable.  The ovaries are relatively symmetric; no suspicious adnexal masses are seen.  No  inguinal lymphadenopathy is seen.  No acute osseous abnormalities are identified.  IMPRESSION:  1.  Suggestion of mild gallbladder wall thickening, with trace associated pericholecystic fluid and suggestion of minimal soft tissue stranding.  This could reflect very mild cholecystitis.  No stones identified on CT; right upper  quadrant abdominal ultrasound could be considered for further evaluation, as deemed clinically appropriate. 2.  Suggestion of mild fatty infiltration within the liver.  3.  Mild bibasilar atelectasis noted.  Original Report Authenticated By: Tonia Ghent, M.D.    Medications:    . HYDROmorphone      . HYDROmorphone      . DISCONTD:  ceFAZolin (ANCEF) IV  1 g Intravenous Q8H    Assessment/Plan Acute cholecystitis s/p Laparoscopic Cholecystectomy with IOC  03/16/2012 Dr. Gerrit Friends   Plan:  Home today.  Pt. Instructed and questions answered.     LOS: 2 days    Savannah Rogers 03/17/2012

## 2012-03-23 ENCOUNTER — Telehealth (INDEPENDENT_AMBULATORY_CARE_PROVIDER_SITE_OTHER): Payer: Self-pay

## 2012-03-23 NOTE — Telephone Encounter (Signed)
Appointment card mailed for post op appointment 04/13/2012.

## 2012-03-24 ENCOUNTER — Ambulatory Visit (INDEPENDENT_AMBULATORY_CARE_PROVIDER_SITE_OTHER): Payer: BC Managed Care – PPO | Admitting: Internal Medicine

## 2012-03-24 ENCOUNTER — Encounter: Payer: Self-pay | Admitting: Internal Medicine

## 2012-03-24 VITALS — BP 120/78 | HR 80 | Temp 98.4°F | Wt 145.6 lb

## 2012-03-24 DIAGNOSIS — R05 Cough: Secondary | ICD-10-CM

## 2012-03-24 DIAGNOSIS — R059 Cough, unspecified: Secondary | ICD-10-CM

## 2012-03-24 MED ORDER — MONTELUKAST SODIUM 10 MG PO TABS: 10.0000 mg | ORAL_TABLET | Freq: Every day | ORAL | Status: DC

## 2012-03-24 MED ORDER — BECLOMETHASONE DIPROPIONATE 80 MCG/ACT IN AERS: INHALATION_SPRAY | RESPIRATORY_TRACT | Status: DC

## 2012-03-24 NOTE — Progress Notes (Signed)
  Subjective:    Patient ID: Savannah Rogers, female    DOB: 1966/07/23, 46 y.o.   MRN: [id number]  HPI For over a month she's had intermittent cough. This can occur throughout the day but most often with breakfast. She begins  eating  & then experiences itching in her throat followed by cough. This can be dry or produce white sputum. Her breakfast consists of apples, bananas, and milk.  The cough can be severe enough to result in some emesis. The cough can also occur at night  She can also have the symptoms at other meals as well without these specific foods.  She denies any frank dysphagia or significant reflux. She has occasional sneezing but no significant extrinsic symptoms of itchy eyes, sneezing or wheezing.  She is not on an ACE inhibitor or  beta blocker  Past medical history/family history/social history were all reviewed and updated. Pertinent data: She had a laparoscopic cholecystectomy last week this has had no impact on her symptoms  There is no personal or family history of asthma.    Review of Systems She has had no persistent frontal headaches, facial pain, nasal purulence, purulence sputum, or associated rash.  She's had no swelling of lids, lips, tongue  She denies fever, chills, or significant weight loss.     Objective:   Physical Exam General appearance:good health ;well nourished; no acute distress or increased work of breathing is present.  No  lymphadenopathy about the head, neck, or axilla noted.   Eyes: No conjunctival inflammation or lid edema is present. There is no scleral icterus.  Ears:  Cerumen present on the left, obscuring tympanic membrane. Right tympanic membrane normal except for minor scarring inferiorly  Nose:  External nasal examination shows no deformity or inflammation. Nasal mucosa are pink and moist without lesions or exudates. No septal dislocation or deviation.No obstruction to airflow.   Oral exam: Dental hygiene is good; lips and gums  are healthy appearing.There is no oropharyngeal erythema or exudate noted.   Neck:  No deformities, thyromegaly, masses, or tenderness noted.   Supple with full range of motion without pain.   Heart:  Normal rate and regular rhythm. S1 and S2 normal without gallop, murmur, click, rub or other extra sounds.   Lungs:Chest clear to auscultation; no wheezes, rhonchi,rales ,or rubs present.No increased work of breathing.    Extremities:  No cyanosis, edema, or clubbing  noted    Skin: Warm & dry           Assessment & Plan:  #1 cough, atypical. Major trigger appears to be foods. The cough is preceded by a "tickle".  I question a process such as eosinophilic esophagitis, but this should be a diagnosis of exclusion.  Plan: Trial of inhaled steroids twice a day and generic Singulair 10 mg daily. If symptoms persist or progress; GI evaluation indicated.

## 2012-03-24 NOTE — Patient Instructions (Signed)
QVAR  one inhalation every 12 hours; gargle and SWALLOW  after use

## 2012-03-29 ENCOUNTER — Encounter (HOSPITAL_COMMUNITY): Payer: Self-pay | Admitting: Surgery

## 2012-04-13 ENCOUNTER — Encounter (INDEPENDENT_AMBULATORY_CARE_PROVIDER_SITE_OTHER): Payer: Self-pay | Admitting: Surgery

## 2012-04-13 ENCOUNTER — Ambulatory Visit (INDEPENDENT_AMBULATORY_CARE_PROVIDER_SITE_OTHER): Payer: BC Managed Care – PPO | Admitting: Surgery

## 2012-04-13 VITALS — BP 122/82 | HR 88 | Temp 97.9°F | Resp 14 | Ht 62.0 in | Wt 147.4 lb

## 2012-04-13 DIAGNOSIS — K8010 Calculus of gallbladder with chronic cholecystitis without obstruction: Secondary | ICD-10-CM

## 2012-04-13 NOTE — Progress Notes (Signed)
Visit Diagnoses: 1. Cholelithiasis with cholecystitis     HISTORY: Patient is a 46 year old female who underwent laparoscopic cholecystectomy for symptomatic cholelithiasis at [facility] four weeks ago.  Postoperative course has been uneventful. She has no complaints.  EXAM: Abdomen is soft without distention. Surgical wounds are well healed. No sign of infection. No sign of herniation. Right upper quadrant is soft and nontender without mass.  IMPRESSION: Status post laparoscopic cholecystectomy for chronic cholecystitis and cholelithiasis  PLAN: Patient will begin applying topical creams to her incisions. She is released to full activity without restriction. Patient will return to see [place] as needed.  Velora Heckler, MD, FACS General & Endocrine Surgery [facility]

## 2012-04-13 NOTE — Patient Instructions (Signed)
  COCOA BUTTER & VITAMIN E CREAM  ([employer] or other brand)  Apply cocoa butter/vitamin E cream to your incision 2 - 3 times daily.  Massage cream into incision for one minute with each application.  Use sunscreen (50 SPF or higher) for first 6 months after surgery if area is exposed to sun.  You may substitute Mederma or other scar reducing creams as desired.   

## 2012-06-08 ENCOUNTER — Other Ambulatory Visit: Payer: Self-pay | Admitting: Internal Medicine

## 2012-06-08 NOTE — Telephone Encounter (Signed)
Lipid 272.4 

## 2012-07-23 ENCOUNTER — Other Ambulatory Visit: Payer: Self-pay | Admitting: Internal Medicine

## 2012-07-23 DIAGNOSIS — E785 Hyperlipidemia, unspecified: Secondary | ICD-10-CM

## 2012-07-26 ENCOUNTER — Other Ambulatory Visit (INDEPENDENT_AMBULATORY_CARE_PROVIDER_SITE_OTHER): Payer: BC Managed Care – PPO

## 2012-07-26 DIAGNOSIS — E785 Hyperlipidemia, unspecified: Secondary | ICD-10-CM

## 2012-07-26 LAB — LIPID PANEL
Cholesterol: 175 mg/dL (ref 0–200)
HDL: 44.7 mg/dL (ref >39.00)
LDL Cholesterol: 106 mg/dL — ABNORMAL HIGH (ref 0–99)
Total CHOL/HDL Ratio: 4
Triglycerides: 124 mg/dL (ref 0.0–149.0)
VLDL: 24.8 mg/dL (ref 0.0–40.0)

## 2012-08-23 ENCOUNTER — Other Ambulatory Visit (HOSPITAL_COMMUNITY)
Admission: RE | Admit: 2012-08-23 | Discharge: 2012-08-23 | Disposition: A | Discharge status: Home / Self Care | Payer: BC Managed Care – PPO | Source: Ambulatory Visit | Attending: Obstetrics and Gynecology | Admitting: Obstetrics and Gynecology

## 2012-08-23 ENCOUNTER — Other Ambulatory Visit: Payer: Self-pay | Admitting: Obstetrics and Gynecology

## 2012-08-23 DIAGNOSIS — Z124 Encounter for screening for malignant neoplasm of cervix: Secondary | ICD-10-CM | POA: Insufficient documentation

## 2012-09-22 ENCOUNTER — Other Ambulatory Visit: Payer: Self-pay | Admitting: Internal Medicine

## 2012-11-19 ENCOUNTER — Encounter: Payer: Self-pay | Admitting: Family Medicine

## 2012-11-19 ENCOUNTER — Ambulatory Visit (INDEPENDENT_AMBULATORY_CARE_PROVIDER_SITE_OTHER): Payer: BC Managed Care – PPO | Admitting: Family Medicine

## 2012-11-19 VITALS — BP 120/72 | HR 70 | Temp 98.3°F | Wt 149.8 lb

## 2012-11-19 DIAGNOSIS — R5383 Other fatigue: Secondary | ICD-10-CM

## 2012-11-19 DIAGNOSIS — R5381 Other malaise: Secondary | ICD-10-CM

## 2012-11-19 NOTE — Patient Instructions (Addendum)

## 2012-11-19 NOTE — Addendum Note (Signed)
Addended by: Silvio Pate D on: 11/19/2012 05:00 PM   Modules accepted: Orders

## 2012-11-19 NOTE — Progress Notes (Signed)
  Subjective:     Savannah Rogers is a 46 y.o. female who presents for evaluation of fatigue. Symptoms began several days ago. The patient feels the fatigue began with: change in hair texture. Symptoms of her fatigue have been general malaise and headaches. Patient describes the following psychological symptoms: none. Patient denies cold intolerance, constipation, exercise intolerance, fever, GI blood loss, significant change in weight, symptoms of arthritis, unusual rashes and witnessed or suspected sleep apnea. Symptoms have stabilized. Symptom severity: symptoms bothersome, but easily able to carry out all usual work/school/family activities. Previous visits for this problem: none.   The following portions of the patient's history were reviewed and updated as appropriate: allergies, current medications, past family history, past medical history, past social history, past surgical history and problem list.  Review of Systems Pertinent items are noted in HPI.    Objective:    BP 120/72  Pulse 70  Temp 98.3 F (36.8 C) (Oral)  Wt 149 lb 12.8 oz (67.949 kg)  SpO2 99%  LMP 10/22/2011 General appearance: alert, cooperative, appears stated age and no distress Eyes: conjunctivae/corneas clear. PERRL, EOM's intact. Fundi benign. Ears: normal TM's and external ear canals both ears Nose: Nares normal. Septum midline. Mucosa normal. No drainage or sinus tenderness. Throat: lips, mucosa, and tongue normal; teeth and gums normal Neck: no adenopathy, supple, symmetrical, trachea midline and thyroid not enlarged, symmetric, no tenderness/mass/nodules Lungs: clear to auscultation bilaterally Heart: S1, S2 normal    Assessment:    Fatigue, organic cause likely.  Differential diagnoses includes: anemia due to heavy periods and hypothyroidism.    Plan:    Discussed diagnosis with patient. See orders for lab evaluation. f/u prn

## 2012-11-20 LAB — CBC WITH DIFFERENTIAL/PLATELET
Basophils Absolute: 0.1 10*3/uL (ref 0.0–0.1)
Basophils Relative: 1 % (ref 0–1)
Eosinophils Absolute: 0.4 10*3/uL (ref 0.0–0.7)
Eosinophils Relative: 3 % (ref 0–5)
HCT: 42.8 % (ref 36.0–46.0)
Hemoglobin: 14.7 g/dL (ref 12.0–15.0)
Lymphocytes Relative: 36 % (ref 12–46)
Lymphs Abs: 4.5 10*3/uL — ABNORMAL HIGH (ref 0.7–4.0)
MCH: 29.2 pg (ref 26.0–34.0)
MCHC: 34.3 g/dL (ref 30.0–36.0)
MCV: 85.1 fL (ref 78.0–100.0)
Monocytes Absolute: 0.6 10*3/uL (ref 0.1–1.0)
Monocytes Relative: 5 % (ref 3–12)
Neutro Abs: 6.7 10*3/uL (ref 1.7–7.7)
Neutrophils Relative %: 55 % (ref 43–77)
Platelets: 432 10*3/uL — ABNORMAL HIGH (ref 150–400)
RBC: 5.03 MIL/uL (ref 3.87–5.11)
RDW: 13.5 % (ref 11.5–15.5)
WBC: 12.3 10*3/uL — ABNORMAL HIGH (ref 4.0–10.5)

## 2012-11-20 LAB — HEPATIC FUNCTION PANEL
ALT: 23 U/L (ref 0–35)
AST: 20 U/L (ref 0–37)
Albumin: 4.4 g/dL (ref 3.5–5.2)
Alkaline Phosphatase: 73 U/L (ref 39–117)
Bilirubin, Direct: 0.1 mg/dL (ref 0.0–0.3)
Indirect Bilirubin: 0.1 mg/dL (ref 0.0–0.9)
Total Bilirubin: 0.2 mg/dL — ABNORMAL LOW (ref 0.3–1.2)
Total Protein: 7.7 g/dL (ref 6.0–8.3)

## 2012-11-20 LAB — MONONUCLEOSIS SCREEN: Mono Screen: NEGATIVE

## 2012-11-20 LAB — VITAMIN B12: Vitamin B-12: 849 pg/mL (ref 211–911)

## 2012-11-20 LAB — TSH: TSH: 2.435 u[IU]/mL (ref 0.350–4.500)

## 2012-11-22 LAB — EPSTEIN-BARR VIRUS VCA ANTIBODY PANEL
EBV EA IgG: <5 U/mL (ref <9.0)
EBV EA IgG: 6 U/mL (ref <9.0)
EBV NA IgG: 165 U/mL — ABNORMAL HIGH (ref <18.0)
EBV NA IgG: 207 U/mL — ABNORMAL HIGH (ref <18.0)
EBV VCA IgG: >750 U/mL — ABNORMAL HIGH (ref <18.0)
EBV VCA IgG: >750 U/mL — ABNORMAL HIGH (ref <18.0)
EBV VCA IgM: <10 U/mL (ref <36.0)
EBV VCA IgM: <10 U/mL (ref <36.0)

## 2012-12-29 ENCOUNTER — Telehealth: Payer: Self-pay | Admitting: Internal Medicine

## 2012-12-29 DIAGNOSIS — IMO0002 Reserved for concepts with insufficient information to code with codable children: Secondary | ICD-10-CM

## 2012-12-29 NOTE — Addendum Note (Signed)
Addended by: Maurice Small on: 12/29/2012 04:20 PM   Modules accepted: Orders

## 2012-12-29 NOTE — Telephone Encounter (Signed)
Pt stated her insurance has always paid & she felt this was incorrect but either way she was coming in to have this done. Pt also scheduled cpe for 4.24.14

## 2012-12-29 NOTE — Telephone Encounter (Signed)
pt wants labs done she wants V70 labs as well as UA & sugar-please advise if ok pt wants to come friday at 9 cb# [phone]

## 2012-12-29 NOTE — Telephone Encounter (Signed)
CPX labs are done only with a CPX unless patient has diagnosis to support labs. Based on patient's diagnosis and medication list she can have Lipids checked 272.4, which she is not due for (last lipid check was less than 6 months ago), patient will be at risk for paying for Lipid check for she is not due for it yet (Checked 07/2012).  Patient needs to schedule a CPX

## 2012-12-31 ENCOUNTER — Other Ambulatory Visit: Payer: BC Managed Care – PPO

## 2013-01-02 ENCOUNTER — Encounter: Payer: Self-pay | Admitting: Internal Medicine

## 2013-01-15 ENCOUNTER — Other Ambulatory Visit: Payer: Self-pay

## 2013-03-17 ENCOUNTER — Encounter: Payer: Self-pay | Admitting: Internal Medicine

## 2013-03-17 ENCOUNTER — Ambulatory Visit (INDEPENDENT_AMBULATORY_CARE_PROVIDER_SITE_OTHER): Payer: BC Managed Care – PPO | Admitting: Internal Medicine

## 2013-03-17 VITALS — BP 122/80 | HR 79 | Temp 98.2°F | Resp 12 | Wt 148.0 lb

## 2013-03-17 DIAGNOSIS — R059 Cough, unspecified: Secondary | ICD-10-CM

## 2013-03-17 DIAGNOSIS — J029 Acute pharyngitis, unspecified: Secondary | ICD-10-CM

## 2013-03-17 DIAGNOSIS — R05 Cough: Secondary | ICD-10-CM

## 2013-03-17 MED ORDER — HYDROCODONE-HOMATROPINE 5-1.5 MG/5ML PO SYRP: 5.0000 mL | ORAL_SOLUTION | Freq: Four times a day (QID) | ORAL | Status: DC | PRN

## 2013-03-17 MED ORDER — AZITHROMYCIN 250 MG PO TABS: ORAL_TABLET | ORAL | Status: DC

## 2013-03-17 NOTE — Progress Notes (Signed)
  Subjective:    Patient ID: Savannah Rogers, female    DOB: 1966-09-16, 47 y.o.   MRN: [id number]  HPI  Symptoms began 4/15 as sore throat associated with fever, sweats, right or in chills. Aleve did help those symptoms. She is also had dental pain and earache.  NyQuil was also partial benefit  As of 4/16 she developed a dry cough.  No flu shot to date    Review of Systems    She denies associated frontal headache, facial pain, nasal purulence, otic discharge, sputum production, shortness of breath, or wheezing.  She's had no associated extrinsic symptoms such as itchy, watery eyes or sneezing  For several months she's noted some pain in her knees, worse at night.     Objective:   Physical Exam General appearance:good health ;well nourished; no acute distress or increased work of breathing is present.  No  lymphadenopathy about the head, neck, or axilla noted.   Eyes: No conjunctival inflammation or lid edema is present.   Ears:  External ear exam shows no significant lesions or deformities.  Otoscopic examination reveals wax L > R canal Nose:  External nasal examination shows no deformity or inflammation. Nasal mucosa are erythematous without lesions or exudates. No septal dislocation or deviation.No obstruction to airflow.   Oral exam: Dental hygiene is good; lips and gums are healthy appearing.There is mild oropharyngeal erythema . No exudate noted.   Neck:  No deformities,  masses, or tenderness noted.     Heart:  Normal rate and regular rhythm. S1 and S2 normal without gallop, murmur, click, rub or other extra sounds.   Lungs:Chest clear to auscultation; no wheezes, rhonchi,rales ,or rubs present.No increased work of breathing.    Extremities:  No cyanosis, edema, or clubbing  noted . Her hands revealed no significant changes. She has crepitus of her knees bilaterally without effusion   Skin: Warm & dry          Assessment & Plan:  #1 pharyngitis #2cough  #3  degenerative joint changes of the knees Plan: See orders and recommendations

## 2013-03-17 NOTE — Patient Instructions (Addendum)
Zicam Melts or Zinc lozenges ; vitamin C 2000 mg daily; & Echinacea for 4-7 days for respiratory symptoms. NSAIDS ( Aleve, Advil, Naproxen) as per package insert during day as needed or  Arthritis Strength @ bedtime as needed for knee pain.Consider glucosamine sulfate 1500 mg daily for joint symptoms. Take this daily  for 3 months and then leave it off for 2 months. This will rehydrate the cartilages.

## 2013-03-24 ENCOUNTER — Encounter: Payer: Self-pay | Admitting: Internal Medicine

## 2013-03-24 ENCOUNTER — Ambulatory Visit (INDEPENDENT_AMBULATORY_CARE_PROVIDER_SITE_OTHER): Payer: BC Managed Care – PPO | Admitting: Internal Medicine

## 2013-03-24 VITALS — BP 124/70 | HR 65 | Temp 97.9°F | Resp 14 | Ht 61.5 in | Wt 146.0 lb

## 2013-03-24 DIAGNOSIS — R05 Cough: Secondary | ICD-10-CM

## 2013-03-24 DIAGNOSIS — Z0000 Encounter for general adult medical examination without abnormal findings: Secondary | ICD-10-CM

## 2013-03-24 DIAGNOSIS — E790 Hyperuricemia without signs of inflammatory arthritis and tophaceous disease: Secondary | ICD-10-CM | POA: Insufficient documentation

## 2013-03-24 DIAGNOSIS — R059 Cough, unspecified: Secondary | ICD-10-CM

## 2013-03-24 DIAGNOSIS — D72829 Elevated white blood cell count, unspecified: Secondary | ICD-10-CM | POA: Insufficient documentation

## 2013-03-24 LAB — CBC WITH DIFFERENTIAL/PLATELET
Basophils Absolute: 0.1 10*3/uL (ref 0.0–0.1)
Basophils Relative: 0.4 % (ref 0.0–3.0)
Eosinophils Absolute: 0.2 10*3/uL (ref 0.0–0.7)
Eosinophils Relative: 1.7 % (ref 0.0–5.0)
HCT: 43.1 % (ref 36.0–46.0)
Hemoglobin: 14.5 g/dL (ref 12.0–15.0)
Lymphocytes Relative: 26.2 % (ref 12.0–46.0)
Lymphs Abs: 3.7 10*3/uL (ref 0.7–4.0)
MCHC: 33.7 g/dL (ref 30.0–36.0)
MCV: 86.8 fl (ref 78.0–100.0)
Monocytes Absolute: 0.6 10*3/uL (ref 0.1–1.0)
Monocytes Relative: 4.1 % (ref 3.0–12.0)
Neutro Abs: 9.6 10*3/uL — ABNORMAL HIGH (ref 1.4–7.7)
Neutrophils Relative %: 67.6 % (ref 43.0–77.0)
Platelets: 433 10*3/uL — ABNORMAL HIGH (ref 150.0–400.0)
RBC: 4.96 Mil/uL (ref 3.87–5.11)
RDW: 12.6 % (ref 11.5–14.6)
WBC: 14.1 10*3/uL — ABNORMAL HIGH (ref 4.5–10.5)

## 2013-03-24 LAB — BASIC METABOLIC PANEL
BUN: 12 mg/dL (ref 6–23)
CO2: 26 mEq/L (ref 19–32)
Calcium: 8.9 mg/dL (ref 8.4–10.5)
Chloride: 103 mEq/L (ref 96–112)
Creatinine, Ser: 0.6 mg/dL (ref 0.4–1.2)
GFR: 111.16 mL/min (ref >60.00)
Glucose, Bld: 92 mg/dL (ref 70–99)
Potassium: 4 mEq/L (ref 3.5–5.1)
Sodium: 136 mEq/L (ref 135–145)

## 2013-03-24 LAB — MAGNESIUM: Magnesium: 1.9 mg/dL (ref 1.5–2.5)

## 2013-03-24 LAB — HEPATIC FUNCTION PANEL
ALT: 27 U/L (ref 0–35)
AST: 18 U/L (ref 0–37)
Albumin: 3.8 g/dL (ref 3.5–5.2)
Alkaline Phosphatase: 65 U/L (ref 39–117)
Bilirubin, Direct: 0 mg/dL (ref 0.0–0.3)
Total Bilirubin: 0.2 mg/dL — ABNORMAL LOW (ref 0.3–1.2)
Total Protein: 7.7 g/dL (ref 6.0–8.3)

## 2013-03-24 LAB — TSH: TSH: 0.3 u[IU]/mL — ABNORMAL LOW (ref 0.35–5.50)

## 2013-03-24 LAB — URIC ACID: Uric Acid, Serum: 6.5 mg/dL (ref 2.4–7.0)

## 2013-03-24 NOTE — Progress Notes (Signed)
  Subjective:    Patient ID: Savannah Rogers, female    DOB: 02/20/1966, 47 y.o.   MRN: [id number]  HPI  She is here for a physical;acute issues include persistant cough.     Review of Systems   Despite having completed the azithromycin she continues to have a nonproductive cough. This is initiated by a "tickle" in her throat.She does describe some fever, chills, sweats   She denies significant itchy, watery eyes or sneezing. She also does not have frontal headache, facial pain, nasal purulence, dental pain, sore throat, otic pain, or otic discharge.  She is not having significant reflux symptoms.  She is not on ACE inhibitor.      Objective:   Physical Exam Gen.: Healthy and well-nourished in appearance. Alert, appropriate and cooperative throughout exam. Head: Normocephalic without obvious abnormalities; no alopecia , very thick hair Eyes: No corneal or conjunctival inflammation noted. Pupils equal round reactive to light and accommodation. Fundal exam is benign without hemorrhages, exudate, papilledema. Extraocular motion intact. Vision grossly normal with lenses Ears: External  ear exam reveals no significant lesions or deformities. Canals clear .TMs normal. Hearing is grossly normal bilaterally. Nose: External nasal exam reveals no deformity or inflammation. Nasal mucosa are pink and moist. No lesions or exudates noted.   Mouth: Oral mucosa and oropharynx reveal no lesions or exudates. Teeth in good repair. Neck: No deformities, masses, or tenderness noted. Range of motion & Thyroid normal. Lungs: Normal respiratory effort; chest expands symmetrically. Lungs are clear to auscultation without rales, wheezes, or increased work of breathing. Heart: Normal rate and rhythm. Normal S1 and S2. No gallop, click, or rub. No murmur. Abdomen: Bowel sounds normal; abdomen soft and nontender. No masses, organomegaly or hernias noted. Genitalia: As per Gyn                                   Musculoskeletal/extremities: There is minimal asymmetry of the posterior thoracic musculature suggesting occult scoliosis. No clubbing, cyanosis, edema, or significant extremity  deformity noted. Range of motion normal .Tone & strength  Normal. Crepitus R knee > L . Joints normal . Nail health good. Able to lie down & sit up w/o help. Negative SLR bilaterally Vascular: Carotid, radial artery, dorsalis pedis and  posterior tibial pulses are full and equal. No bruits present. Neurologic: Alert and oriented x3. Deep tendon reflexes symmetrical and normal.          Skin: Intact without suspicious lesions or rashes. Lymph: No cervical, axillary lymphadenopathy present. Psych: Mood and affect are normal. Normally interactive                                                                                       Assessment & Plan:  #1 comprehensive physical exam; no acute findings #2 cough with fever, chills , sweats  Plan: see Orders  & Recommendations

## 2013-03-24 NOTE — Patient Instructions (Addendum)
Order for x-rays entered into  the computer; these will be performed at [facility]. No appointment is necessary.   Please review the record and make any corrections; share this with all medical staff seen. If you note change or progression in symptoms please contact us through My Chart ASAP. This will allow [place] to respond as quickly as possible and schedule appropriate studies (blood test or imaging).

## 2013-03-28 ENCOUNTER — Ambulatory Visit: Payer: BC Managed Care – PPO

## 2013-03-28 DIAGNOSIS — Z0000 Encounter for general adult medical examination without abnormal findings: Secondary | ICD-10-CM

## 2013-03-28 LAB — LIPID PANEL
Cholesterol: 190 mg/dL (ref 0–200)
HDL: 34 mg/dL — ABNORMAL LOW (ref >39.00)
LDL Cholesterol: 121 mg/dL — ABNORMAL HIGH (ref 0–99)
Total CHOL/HDL Ratio: 6
Triglycerides: 174 mg/dL — ABNORMAL HIGH (ref 0.0–149.0)
VLDL: 34.8 mg/dL (ref 0.0–40.0)

## 2013-09-21 ENCOUNTER — Other Ambulatory Visit: Payer: Self-pay | Admitting: Internal Medicine

## 2013-09-21 DIAGNOSIS — Z1231 Encounter for screening mammogram for malignant neoplasm of breast: Secondary | ICD-10-CM

## 2013-10-06 ENCOUNTER — Other Ambulatory Visit: Payer: Self-pay

## 2013-10-19 ENCOUNTER — Ambulatory Visit
Admission: RE | Admit: 2013-10-19 | Discharge: 2013-10-19 | Disposition: A | Discharge status: Home / Self Care | Payer: BC Managed Care – PPO | Source: Ambulatory Visit | Attending: Internal Medicine | Admitting: Internal Medicine

## 2013-10-19 ENCOUNTER — Ambulatory Visit (INDEPENDENT_AMBULATORY_CARE_PROVIDER_SITE_OTHER): Payer: BC Managed Care – PPO | Admitting: Internal Medicine

## 2013-10-19 ENCOUNTER — Encounter: Payer: Self-pay | Admitting: Internal Medicine

## 2013-10-19 VITALS — BP 137/95 | HR 74 | Temp 97.6°F | Resp 18 | Wt 154.0 lb

## 2013-10-19 DIAGNOSIS — Z1231 Encounter for screening mammogram for malignant neoplasm of breast: Secondary | ICD-10-CM

## 2013-10-19 DIAGNOSIS — H6001 Abscess of right external ear: Secondary | ICD-10-CM

## 2013-10-19 DIAGNOSIS — H60399 Other infective otitis externa, unspecified ear: Secondary | ICD-10-CM

## 2013-10-19 MED ORDER — DOXYCYCLINE HYCLATE 100 MG PO TABS: 100.0000 mg | ORAL_TABLET | Freq: Two times a day (BID) | ORAL | Status: DC

## 2013-10-19 NOTE — Progress Notes (Signed)
  Subjective:    Patient ID: Savannah Rogers, female    DOB: Mar 26, 1966, 47 y.o.   MRN: [id number]  HPI Acute visit 3 weeks ago developed a small knot behind the right year, 3 days ago it suddenly increased in size and start hurting.  Past Medical History  Diagnosis Date  . Hyperlipidemia     NMR 05-2010:ldl 55(1415/711),HDL 48, TG 173. NO FH of MI.[place] study LDL goal  LDL goal =<160  . Hyperuricemia    Past Surgical History  Procedure Laterality Date  . Bone marrow biopsy  2006    for elevated WBC  . G3  p1  a2    . Laparascopic cholecystectomy  03/2012    Dr Gerrit Friends  . Cholecystectomy  03/16/2012    INTRAOPERATIVE CHOLANGIOGRAM;  Surgeon: Velora Heckler, MD;  Location: [facility];  Service: General;  Laterality: N/A;   History   Social History  . Marital Status: Single    Spouse Name: N/A    Number of Children: 1  . Years of Education: N/A   Occupational History  .  HOTEL MANAGER   . MANAGER    Social History Main Topics  . Smoking status: Never Smoker   . Smokeless tobacco: Never Used  . Alcohol Use: No  . Drug Use: No  . Sexual Activity: Not on file   Other Topics Concern  . Not on file   Social History Narrative  . No narrative on file     Review of Systems Denies any fever or chills. No dental pain or recent dental work. The patient is from [place] and the last time he visited [place] was 2 years ago. Not on BCP. LMP started few days ago    Objective:   Physical Exam  Constitutional: She appears well-developed and well-nourished.  HENT:  Head: Normocephalic and atraumatic.    oral cavity: no lesions, good dentition, no pain to percussion on the right-sided teeth. Mouth floor  soft, nontender       Assessment & Plan:  Abscess, Superficial abscess, doubt odontogenic, she also has a reactive LAD. Procedure note Area was cleaned with Betadine, I use a small amount of lidocaine 2% without, I did a incision with a blade and remove approximately 1 mL  of purulent material and some blood. Plan: Culture sent Doxycycline, patient knows she cannot take doxy  if pregnant. She seems to be having a lot of pain, will recommend motrin tylenol. See instructions

## 2013-10-19 NOTE — Patient Instructions (Signed)
Okay to take a shower as long as you keep the area clean and dry Put an antibiotic ointment for a few days Take doxycycline as prescribed for one week, you cannot take his medication if  pregnant. Call if the ear is not back to normal in few days. Call anytime if the area gets worse, red, swollen, tender.

## 2013-10-22 LAB — WOUND CULTURE
Gram Stain: NONE SEEN
Gram Stain: NONE SEEN

## 2014-03-06 ENCOUNTER — Other Ambulatory Visit: Payer: Self-pay | Admitting: *Deleted

## 2014-03-06 MED ORDER — MONTELUKAST SODIUM 10 MG PO TABS: 10.0000 mg | ORAL_TABLET | Freq: Every day | ORAL | Status: DC

## 2014-03-06 NOTE — Telephone Encounter (Signed)
Rx sent to the pharmacy by e-script.//AB/CMA 

## 2014-03-27 ENCOUNTER — Other Ambulatory Visit: Payer: Self-pay

## 2014-03-27 MED ORDER — FENOFIBRATE MICRONIZED 130 MG PO CAPS: ORAL_CAPSULE | ORAL | Status: DC

## 2014-05-15 ENCOUNTER — Other Ambulatory Visit: Payer: Self-pay

## 2014-05-15 MED ORDER — MONTELUKAST SODIUM 10 MG PO TABS: 10.0000 mg | ORAL_TABLET | Freq: Every day | ORAL | Status: DC

## 2014-07-21 ENCOUNTER — Ambulatory Visit (INDEPENDENT_AMBULATORY_CARE_PROVIDER_SITE_OTHER)
Admission: RE | Admit: 2014-07-21 | Discharge: 2014-07-21 | Disposition: A | Discharge status: Home / Self Care | Payer: BC Managed Care – PPO | Source: Ambulatory Visit | Attending: Internal Medicine | Admitting: Internal Medicine

## 2014-07-21 ENCOUNTER — Ambulatory Visit (INDEPENDENT_AMBULATORY_CARE_PROVIDER_SITE_OTHER): Payer: BC Managed Care – PPO

## 2014-07-21 ENCOUNTER — Encounter: Payer: Self-pay | Admitting: Internal Medicine

## 2014-07-21 ENCOUNTER — Ambulatory Visit (INDEPENDENT_AMBULATORY_CARE_PROVIDER_SITE_OTHER): Payer: BC Managed Care – PPO | Admitting: Internal Medicine

## 2014-07-21 VITALS — BP 140/92 | HR 77 | Temp 98.3°F | Wt 154.2 lb

## 2014-07-21 DIAGNOSIS — M25561 Pain in right knee: Secondary | ICD-10-CM

## 2014-07-21 DIAGNOSIS — E790 Hyperuricemia without signs of inflammatory arthritis and tophaceous disease: Secondary | ICD-10-CM

## 2014-07-21 DIAGNOSIS — M25569 Pain in unspecified knee: Secondary | ICD-10-CM

## 2014-07-21 DIAGNOSIS — R609 Edema, unspecified: Secondary | ICD-10-CM

## 2014-07-21 DIAGNOSIS — R7989 Other specified abnormal findings of blood chemistry: Secondary | ICD-10-CM

## 2014-07-21 DIAGNOSIS — E785 Hyperlipidemia, unspecified: Secondary | ICD-10-CM

## 2014-07-21 LAB — HEPATIC FUNCTION PANEL
ALT: 34 U/L (ref 0–35)
AST: 25 U/L (ref 0–37)
Albumin: 3.9 g/dL (ref 3.5–5.2)
Alkaline Phosphatase: 65 U/L (ref 39–117)
Bilirubin, Direct: 0.1 mg/dL (ref 0.0–0.3)
Total Bilirubin: 0.6 mg/dL (ref 0.2–1.2)
Total Protein: 7.5 g/dL (ref 6.0–8.3)

## 2014-07-21 LAB — LIPID PANEL
Cholesterol: 221 mg/dL — ABNORMAL HIGH (ref 0–200)
HDL: 44 mg/dL (ref >39.00)
NonHDL: 177
Total CHOL/HDL Ratio: 5
Triglycerides: 221 mg/dL — ABNORMAL HIGH (ref 0.0–149.0)
VLDL: 44.2 mg/dL — ABNORMAL HIGH (ref 0.0–40.0)

## 2014-07-21 LAB — BASIC METABOLIC PANEL
BUN: 12 mg/dL (ref 6–23)
CO2: 24 mEq/L (ref 19–32)
Calcium: 9.2 mg/dL (ref 8.4–10.5)
Chloride: 103 mEq/L (ref 96–112)
Creatinine, Ser: 0.7 mg/dL (ref 0.4–1.2)
GFR: 97.52 mL/min (ref >60.00)
Glucose, Bld: 108 mg/dL — ABNORMAL HIGH (ref 70–99)
Potassium: 4.2 mEq/L (ref 3.5–5.1)
Sodium: 137 mEq/L (ref 135–145)

## 2014-07-21 LAB — SEDIMENTATION RATE: Sed Rate: 22 mm/hr (ref 0–22)

## 2014-07-21 LAB — URIC ACID: Uric Acid, Serum: 8.6 mg/dL — ABNORMAL HIGH (ref 2.4–7.0)

## 2014-07-21 LAB — TSH: TSH: 2.22 u[IU]/mL (ref 0.35–4.50)

## 2014-07-21 LAB — RHEUMATOID FACTOR: Rhuematoid fact SerPl-aCnc: <10 IU/mL (ref <=14)

## 2014-07-21 MED ORDER — INDOMETHACIN 50 MG PO CAPS: 50.0000 mg | ORAL_CAPSULE | Freq: Three times a day (TID) | ORAL | Status: DC

## 2014-07-21 NOTE — Assessment & Plan Note (Signed)
Lipids, LFTs, TSH  

## 2014-07-21 NOTE — Progress Notes (Signed)
   Subjective:    Patient ID: Savannah Rogers, female    DOB: 10-10-1966, 48 y.o.   MRN: [id number]  HPI  She began to have pain in the right knee 2 weeks ago which woke her during the night. There were no symptoms prior to going to bed although intermittently in the past she has had pain in the knee.  There's been no trigger or injury.  She's had intermittent pain since ,localized to the knee without radiation,  up to a level VII.  There is associated swelling. Also with dependency she'll have some pedal edema as well.  She does have a past history of hyperuricemia with uric acid of 8.5. Her father had gout  She wants to have her lipids rechecked; she is not on a statin. No FH of premature CAD/MI.   Review of Systems   She denies redness or change in temperature over the knee.  She has no constitutional symptoms of fever, chills, sweats, change in weight  There is no associated myalgias  There is no numbness, tingling, weakness in the right lower extremity.  She's had no incontinence of urine or stool.       Objective:   Physical Exam  Pertinent or positive findings include: There is marked crepitus of the knees. There appears to be some fusiform enlargement, right greater than left with possible effusion on the right. The remainder of the musculoskeletal exam is unremarkable. Neg SLR. Strength, tone, and deep tendon reflexes are normal. No significant edema present.No cyanosis or clubbing.  General appearance :adequately nourished; in no distress. Eyes: No conjunctival inflammation or scleral icterus is present. Heart:  Normal rate and regular rhythm. S1 and S2 normal without gallop, murmur, click, rub or other extra sounds   Lungs:Chest clear to auscultation; no wheezes, rhonchi,rales ,or rubs present.No increased work of breathing.  Abdomen: bowel sounds normal, soft and non-tender without masses, organomegaly or hernias noted.  No guarding or rebound. No flank tenderness  to percussion. Skin:Warm & dry.  Intact without suspicious lesions or rashes ; no jaundice or tenting Vasc: all pulses intact ; no bruits Lymphatic: No lymphadenopathy is noted about the head, neck, axilla           Assessment & Plan:  See Current Assessment & Plan in Problem List under specific Diagnosis

## 2014-07-21 NOTE — Assessment & Plan Note (Signed)
Films R knee Indocin & topical agents

## 2014-07-21 NOTE — Progress Notes (Signed)
Pre visit review using our clinic review tool, if applicable. No additional management support is needed unless otherwise documented below in the visit note. 

## 2014-07-21 NOTE — Assessment & Plan Note (Signed)
Uric acid

## 2014-07-21 NOTE — Patient Instructions (Signed)
Use an anti-inflammatory cream such as Aspercreme or Zostrix cream twice a day to the affected area as needed. In lieu of this warm moist compresses or  hot water bottle can be used. Do not apply ice . 

## 2014-07-22 LAB — LDL CHOLESTEROL, DIRECT: Direct LDL: 172.4 mg/dL

## 2014-07-23 ENCOUNTER — Encounter: Payer: Self-pay | Admitting: Internal Medicine

## 2014-08-04 ENCOUNTER — Encounter: Payer: Self-pay | Admitting: Internal Medicine

## 2014-08-04 ENCOUNTER — Ambulatory Visit (INDEPENDENT_AMBULATORY_CARE_PROVIDER_SITE_OTHER): Payer: BC Managed Care – PPO | Admitting: Internal Medicine

## 2014-08-04 VITALS — BP 138/92 | HR 72 | Temp 98.3°F | Wt 154.5 lb

## 2014-08-04 DIAGNOSIS — R232 Flushing: Secondary | ICD-10-CM

## 2014-08-04 DIAGNOSIS — R7989 Other specified abnormal findings of blood chemistry: Secondary | ICD-10-CM

## 2014-08-04 DIAGNOSIS — E790 Hyperuricemia without signs of inflammatory arthritis and tophaceous disease: Secondary | ICD-10-CM

## 2014-08-04 DIAGNOSIS — N951 Menopausal and female climacteric states: Secondary | ICD-10-CM

## 2014-08-04 MED ORDER — ALLOPURINOL 300 MG PO TABS
300.0000 mg | ORAL_TABLET | Freq: Every day | ORAL | Status: DC
Start: 2014-08-04 — End: 2014-10-23

## 2014-08-04 MED ORDER — CLONIDINE HCL 0.1 MG PO TABS: 0.1000 mg | ORAL_TABLET | Freq: Three times a day (TID) | ORAL | Status: DC

## 2014-08-04 NOTE — Progress Notes (Signed)
Pre visit review using our clinic review tool, if applicable. No additional management support is needed unless otherwise documented below in the visit note. 

## 2014-08-04 NOTE — Patient Instructions (Signed)
The most common cause of elevated uric acid  triglycerides (TG) is the ingestion of sugar from high fructose corn syrup sources added to processed foods & drinks as we discussed.  Eat a low-fat diet with lots of fruits and vegetables, up to 7-9 servings per day. Consume less than 30  Grams (preferably ZERO) of sugar per day from foods & drinks with High Fructose Corn Syrup (HFCS) sugar as #1,2,3 or # 4 on label.Whole Foods, [employer] & [employer] do not carry products with HFCS. Repeat uric acid in 8-10 weeks

## 2014-08-04 NOTE — Progress Notes (Signed)
   Subjective:    Patient ID: Savannah Rogers, female    DOB: 1966-01-02, 48 y.o.   MRN: [id number]  HPI Right knee pain has resolved without treatment; but she is concerned about the abnormal lab studies.  A hardcopy was reviewed in detail with her and the pathophysiology of hyper uricemia from the ingestion of high fructose corn syrup sugars was discussed. Diagram was provided.  At this time her major concern are hot flashes for which she is requesting treatment.   Review of Systems   Chest pain, palpitations, tachycardia, exertional dyspnea, paroxysmal nocturnal dyspnea, claudication or edema are absent.       Objective:   Physical Exam   Positive or pertinent findings include: Grade 1/2-1 systolic murmur. Abdomen slightly protuberant. She has crepitus of her knees without associated effusion. There is no pain with range of motion. Pedal pulses are equal but slightly decreased  General appearance :adequately nourished; in no distress. Eyes: No conjunctival inflammation or scleral icterus is present. Oral exam: Dental hygiene is good. Lips and gums are healthy appearing.There is no oropharyngeal erythema or exudate noted.  Heart:  Normal rate and regular rhythm. S1 and S2 normal without gallop, murmur, click, rub or other extra sounds   Lungs:Chest clear to auscultation; no wheezes, rhonchi,rales ,or rubs present.No increased work of breathing.  Abdomen: bowel sounds normal, soft and non-tender without masses, organomegaly or hernias noted.  No guarding or rebound.  Skin:Warm & dry.  Intact without suspicious lesions or rashes ; no jaundice or tenting Lymphatic: No lymphadenopathy is noted about the head, neck, axilla.             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 Dietary interventions discussed.Repeat uric acid in 8-10 weeks  #2 hot flashes; trial of clonidine 0.1 mg twice a day was prescribed.  #3 mild HTN; see #1

## 2014-10-23 ENCOUNTER — Other Ambulatory Visit: Payer: Self-pay

## 2014-10-23 DIAGNOSIS — E790 Hyperuricemia without signs of inflammatory arthritis and tophaceous disease: Secondary | ICD-10-CM

## 2014-10-23 MED ORDER — ALLOPURINOL 300 MG PO TABS: 300.0000 mg | ORAL_TABLET | Freq: Every day | ORAL | Status: DC

## 2014-11-28 ENCOUNTER — Telehealth: Payer: Self-pay | Admitting: Internal Medicine

## 2014-11-28 ENCOUNTER — Other Ambulatory Visit: Payer: Self-pay | Admitting: Internal Medicine

## 2014-11-28 DIAGNOSIS — E782 Mixed hyperlipidemia: Secondary | ICD-10-CM

## 2014-11-28 DIAGNOSIS — E790 Hyperuricemia without signs of inflammatory arthritis and tophaceous disease: Secondary | ICD-10-CM

## 2014-11-28 NOTE — Telephone Encounter (Signed)
Orders entered

## 2014-11-28 NOTE — Telephone Encounter (Signed)
Patient is aware 

## 2014-11-28 NOTE — Telephone Encounter (Signed)
Patient is requesting lab work.  States Dr. Alwyn RenHopper was wanting her to come back in.

## 2014-11-29 ENCOUNTER — Other Ambulatory Visit (INDEPENDENT_AMBULATORY_CARE_PROVIDER_SITE_OTHER): Payer: BC Managed Care – PPO

## 2014-11-29 DIAGNOSIS — E782 Mixed hyperlipidemia: Secondary | ICD-10-CM

## 2014-11-29 DIAGNOSIS — E790 Hyperuricemia without signs of inflammatory arthritis and tophaceous disease: Secondary | ICD-10-CM

## 2014-11-29 LAB — LIPID PANEL
Cholesterol: 226 mg/dL — ABNORMAL HIGH (ref 0–200)
HDL: 41.8 mg/dL (ref >39.00)
NonHDL: 184.2
Total CHOL/HDL Ratio: 5
Triglycerides: 239 mg/dL — ABNORMAL HIGH (ref 0.0–149.0)
VLDL: 47.8 mg/dL — ABNORMAL HIGH (ref 0.0–40.0)

## 2014-11-29 LAB — URIC ACID: Uric Acid, Serum: 5.3 mg/dL (ref 2.4–7.0)

## 2014-11-29 LAB — LDL CHOLESTEROL, DIRECT: Direct LDL: 149.7 mg/dL

## 2014-11-29 LAB — TSH: TSH: 2.23 u[IU]/mL (ref 0.35–4.50)

## 2014-12-04 ENCOUNTER — Encounter: Payer: Self-pay | Admitting: Internal Medicine

## 2014-12-04 ENCOUNTER — Ambulatory Visit (INDEPENDENT_AMBULATORY_CARE_PROVIDER_SITE_OTHER): Payer: BLUE CROSS/BLUE SHIELD | Admitting: Internal Medicine

## 2014-12-04 VITALS — BP 160/98 | HR 72 | Temp 97.9°F | Ht 61.5 in | Wt 153.0 lb

## 2014-12-04 DIAGNOSIS — E782 Mixed hyperlipidemia: Secondary | ICD-10-CM

## 2014-12-04 DIAGNOSIS — R739 Hyperglycemia, unspecified: Secondary | ICD-10-CM

## 2014-12-04 DIAGNOSIS — E119 Type 2 diabetes mellitus without complications: Secondary | ICD-10-CM | POA: Insufficient documentation

## 2014-12-04 DIAGNOSIS — E790 Hyperuricemia without signs of inflammatory arthritis and tophaceous disease: Secondary | ICD-10-CM

## 2014-12-04 DIAGNOSIS — R030 Elevated blood-pressure reading, without diagnosis of hypertension: Secondary | ICD-10-CM

## 2014-12-04 MED ORDER — FENOFIBRATE 145 MG PO TABS: 145.0000 mg | ORAL_TABLET | Freq: Every day | ORAL | Status: DC

## 2014-12-04 MED ORDER — ALLOPURINOL 300 MG PO TABS: 300.0000 mg | ORAL_TABLET | Freq: Every day | ORAL | Status: DC

## 2014-12-04 NOTE — Progress Notes (Signed)
Pre visit review using our clinic review tool, if applicable. No additional management support is needed unless otherwise documented below in the visit note. 

## 2014-12-04 NOTE — Patient Instructions (Addendum)
Please check with your insurance company as to the preferred agent to lower Triglycerides if new prescription not covered.  The most common cause of elevated triglycerides (TG) is the ingestion of sugar from high fructose corn syrup sources added to processed foods & drinks.  Eat a low-fat diet with lots of fruits and vegetables, up to 7-9 servings per day. Consume less than 30 Grams (preferably ZERO) of sugar per day from foods & drinks with High Fructose Corn Syrup (HFCS) sugar as #1,2,3 or # 4 on label.Whole Foods, [employer] & [employer] do not carry products with HFCS.   Minimal Blood Pressure Goal= AVERAGE < 140/90;  Ideal is an AVERAGE < 135/85. This AVERAGE should be calculated from @ least 5-7 BP readings taken @ different times of day on different days of week. You should not respond to isolated BP readings , but rather the AVERAGE for that week .Please bring your  blood pressure cuff to office visits to verify that it is reliable.It  can also be checked against the blood pressure device at the pharmacy. Finger or wrist cuffs are not dependable; an arm cuff is. Fill the  prescription for the BP medication if BP NOT @ goal based on  7 to 14 day average.   When flying please wear support hose and tennis shoes. Perform isometric exercises at least hourly and ambulate as much is possible in flight to prevent clots in the legs.

## 2014-12-04 NOTE — Progress Notes (Signed)
   Subjective:    Patient ID: Savannah Rogers, female    DOB: 1966/07/07, 49 y.o.   MRN: [id number]  HPI  She has been off her fenofibrate for at least 2 months. She thought it caused joint symptoms  It also cost $150 per prescription  She is not monitoring her blood pressure. She does restrict salt. She's not exercising ;she's been under stress recently as her father recently had a stroke. He lives in [place] and she will be going home to visit him. She attributes BP rise to concern over his health.  Her labs were reviewed. There is dramatic improvement in uric acid. His dropped from 8.6 down to 5.3  TSH is therapeutic at 2.23.  Off the fenofibrate triglycerides have risen from a low of 118 up to 239. Direct LDL is 149.7. Based on advance cholesterol testing; her LDL goal is less than 100.   Review of Systems   She is on doxycycline for dermatitis.   After standing for a period of time she will have some pedal edema.  She denies any other cardiovascular symptoms.   Chest pain, palpitations, tachycardia, exertional dyspnea, paroxysmal nocturnal dyspnea, or claudication  are absent.        Objective:   Physical Exam  Pertinent or positive findings include: Repeat blood pressure 140/105 The first heart sounds accentuated She is crepitus of knees without associated effusion.  General appearance :adequately nourished; in no distress. Eyes: No conjunctival inflammation or scleral icterus is present. Oral exam: Dental hygiene is good. Lips and gums are healthy appearing.There is no oropharyngeal erythema or exudate noted.  Heart:  Normal rate and regular rhythm. S2 normal without gallop, murmur, click, rub or other extra sounds   Lungs:Chest clear to auscultation; no wheezes, rhonchi,rales ,or rubs present.No increased work of breathing.  Abdomen: Slightly protuberant;bowel sounds normal, soft and non-tender without masses, organomegaly or hernias noted.  No guarding or  rebound.  Vascular : all pulses equal ; no bruits present. Skin:Warm & dry.  Intact without suspicious lesions or rashes ; no tenting Lymphatic: No lymphadenopathy is noted about the head, neck, axilla        Assessment & Plan:  See Current Assessment & Plan in Problem List under specific Diagnosis

## 2014-12-05 ENCOUNTER — Telehealth: Payer: Self-pay

## 2014-12-05 ENCOUNTER — Other Ambulatory Visit: Payer: Self-pay

## 2014-12-05 ENCOUNTER — Telehealth: Payer: Self-pay | Admitting: Internal Medicine

## 2014-12-05 DIAGNOSIS — R030 Elevated blood-pressure reading, without diagnosis of hypertension: Secondary | ICD-10-CM

## 2014-12-05 MED ORDER — METOPROLOL TARTRATE 25 MG PO TABS: 25.0000 mg | ORAL_TABLET | Freq: Two times a day (BID) | ORAL | Status: DC

## 2014-12-05 MED ORDER — MONTELUKAST SODIUM 10 MG PO TABS: 10.0000 mg | ORAL_TABLET | Freq: Every day | ORAL | Status: DC

## 2014-12-05 NOTE — Telephone Encounter (Signed)
Phone call back to patient and explained to her to fill the Metoprolol only if her blood pressure is not less than 140/90 over a 7 - 14 day period. There were no further questions/concerns.

## 2014-12-05 NOTE — Telephone Encounter (Signed)
-----   Message from Pecola LawlessWilliam F Hopper, MD sent at 12/05/2014  6:08 AM EST ----- Please FAX Rx for metoprolol 25 mg bid to pharmacy Fill the  prescription for the BP medication if BP NOT @ goal of < 140/90  based on  7 to 14 day average.

## 2014-12-05 NOTE — Telephone Encounter (Signed)
Patient would like a call back in regards to why she is being put on blood pressure meds b/c she states Dr. Alwyn RenHopper told her she did not have high BP.

## 2014-12-05 NOTE — Telephone Encounter (Signed)
Patient notified via voicemail that metoprolol has been sent to [facility] [phone]

## 2014-12-05 NOTE — Assessment & Plan Note (Signed)
Subjectively attributed to stress over father's CVA BP goals discussed

## 2014-12-05 NOTE — Assessment & Plan Note (Signed)
Non compliance due to joint , not myalgia , pain and cost Risk discussed Dietary interventions discussed Verification of preferred agent requested

## 2015-03-13 ENCOUNTER — Ambulatory Visit (INDEPENDENT_AMBULATORY_CARE_PROVIDER_SITE_OTHER): Payer: BLUE CROSS/BLUE SHIELD | Admitting: Internal Medicine

## 2015-03-13 VITALS — BP 160/94 | HR 83 | Temp 97.8°F | Resp 16 | Ht 61.0 in | Wt 157.0 lb

## 2015-03-13 DIAGNOSIS — R21 Rash and other nonspecific skin eruption: Secondary | ICD-10-CM | POA: Diagnosis not present

## 2015-03-13 DIAGNOSIS — B354 Tinea corporis: Secondary | ICD-10-CM

## 2015-03-13 DIAGNOSIS — J301 Allergic rhinitis due to pollen: Secondary | ICD-10-CM | POA: Diagnosis not present

## 2015-03-13 DIAGNOSIS — R05 Cough: Secondary | ICD-10-CM

## 2015-03-13 DIAGNOSIS — R059 Cough, unspecified: Secondary | ICD-10-CM

## 2015-03-13 LAB — POCT SKIN KOH: Skin KOH, POC: POSITIVE

## 2015-03-13 MED ORDER — KETOCONAZOLE 2 % EX CREA: 1.0000 "application " | TOPICAL_CREAM | Freq: Every day | CUTANEOUS | Status: DC

## 2015-03-13 MED ORDER — CETIRIZINE HCL 10 MG PO TABS: 10.0000 mg | ORAL_TABLET | Freq: Every day | ORAL | Status: DC

## 2015-03-13 MED ORDER — BECLOMETHASONE DIPROPIONATE 40 MCG/ACT IN AERS: 2.0000 | INHALATION_SPRAY | Freq: Two times a day (BID) | RESPIRATORY_TRACT | Status: DC

## 2015-03-13 MED ORDER — HYDROCODONE-HOMATROPINE 5-1.5 MG/5ML PO SYRP: 5.0000 mL | ORAL_SOLUTION | Freq: Four times a day (QID) | ORAL | Status: DC | PRN

## 2015-03-13 MED ORDER — FLUTICASONE PROPIONATE 50 MCG/ACT NA SUSP: 1.0000 | Freq: Two times a day (BID) | NASAL | Status: DC

## 2015-03-13 MED ORDER — TERBINAFINE HCL 250 MG PO TABS: 250.0000 mg | ORAL_TABLET | Freq: Every day | ORAL | Status: DC

## 2015-03-13 NOTE — Progress Notes (Signed)
 Subjective:  This chart was scribed for Robert Doolittle, MD by Nadim Abu Hashem, medical scribe at [facility].The patient was seen in exam room 12 and the patient's care was started at 6:02 PM.   Patient ID: Savannah Rogers, female    DOB: 11/30/1966, 49 y.o.   MRN: [id number] Chief Complaint  Patient presents with  . Rash    on Stomach  . Sore Throat   HPI HPI Comments: Savannah Rogers is a 49 y.o. female who presents to [facility] complaining of a rash, ongoing for one month now. She has two spots on the upper left and right side of her abdomen. She does not have a rash on her back.  Pt also complains a persistent cough for one month. She has associated sore throat, nausea, sneezing, and SOB. Her cough worsens at night and wakes her during the night. She denies fever and rhinorrhea.  Patient Active Problem List   Diagnosis Date Noted  . Elevated blood pressure reading without diagnosis of hypertension 12/05/2014  . Hyperglycemia 12/04/2014  . Knee pain, right 07/21/2014  . Hyperuricemia 03/24/2013  . Elevated white blood cell count 03/24/2013  . Cholelithiasis with cholecystitis 04/13/2012  . Mixed hyperlipidemia 03/06/2008   Past Medical History  Diagnosis Date  . Hyperlipidemia     NMR 05-2010:ldl 55(1415/711),HDL 48, TG 173. NO FH of MI.[place] study LDL goal  LDL goal =<160  . Hyperuricemia    Past Surgical History  Procedure Laterality Date  . Bone marrow biopsy  2006    for elevated WBC  . G3  p1  a2    . Laparascopic cholecystectomy  03/2012    Dr Gerkin  . Cholecystectomy  03/16/2012    INTRAOPERATIVE CHOLANGIOGRAM;  Surgeon: Todd M Gerkin, MD;  Location: [facility];  Service: General;  Laterality: N/A;   No Known Allergies Prior to Admission medications   Medication Sig Start Date End Date Taking? Authorizing Provider  allopurinol (ZYLOPRIM) 300 MG tablet Take 1 tablet (300 mg total) by mouth daily. 12/04/14  Yes William F Hopper,  MD  fenofibrate (TRICOR) 145 MG tablet Take 1 tablet (145 mg total) by mouth daily. 12/04/14  Yes William F Hopper, MD  montelukast (SINGULAIR) 10 MG tablet Take 1 tablet (10 mg total) by mouth at bedtime. 12/05/14 12/05/15 Yes William F Hopper, MD   Review of Systems  Constitutional: Negative for fever.  HENT: Positive for sneezing and sore throat.   Respiratory: Positive for cough.   Gastrointestinal: Positive for nausea.  Skin: Positive for rash.      Objective:  BP 160/94 mmHg  Pulse 83  Temp(Src) 97.8 F (36.6 C) (Oral)  Resp 16  Ht 5' 1" (1.549 m)  Wt 157 lb (71.215 kg)  BMI 29.68 kg/m2  SpO2 98%  LMP 03/05/2015 Physical Exam  Constitutional: She is oriented to person, place, and time. She appears well-developed and well-nourished. No distress.  HENT:  Head: Atraumatic. Macrocephalic.  Nose: Nose normal.  Mouth/Throat: Oropharynx is clear and moist. No oropharyngeal exudate.  Ear canals are obscured by cerumen.   Eyes: Pupils are equal, round, and reactive to light.  Neck: Normal range of motion. No thyromegaly present.  Cardiovascular: Normal rate, regular rhythm and normal heart sounds.   No murmur heard. Pulmonary/Chest: Effort normal. No respiratory distress. She has no wheezes. She has no rales.  Musculoskeletal: Normal range of motion.  Lymphadenopathy:    She has no cervical adenopathy.    Neurological: She is alert and oriented to person, place, and time.  Skin: Skin is warm and dry.  Two oval lesions 2-3 cm diameter on each anterior axillary line with central clearing and advancing border.  Psychiatric: She has a normal mood and affect. Her behavior is normal.  Nursing note and vitals reviewed.     Assessment & Plan:  Rash and nonspecific skin eruption - Plan: POCT Skin KOH=Tinea corporis  Cough--allergic  Allergic rhinitis due to pollen  Meds ordered this encounter  Medications  . ketoconazole (NIZORAL) 2 % cream    Sig: Apply 1 application topically  daily.    Dispense:  15 g    Refill:  0  . terbinafine (LAMISIL) 250 MG tablet    Sig: Take 1 tablet (250 mg total) by mouth daily.    Dispense:  7 tablet    Refill:  0  . cetirizine (ZYRTEC) 10 MG tablet    Sig: Take 1 tablet (10 mg total) by mouth daily.    Dispense:  30 tablet    Refill:  11  . HYDROcodone-homatropine (HYCODAN) 5-1.5 MG/5ML syrup    Sig: Take 5 mLs by mouth every 6 (six) hours as needed. Especially for bedtime    Dispense:  120 mL    Refill:  0  . beclomethasone (QVAR) 40 MCG/ACT inhaler    Sig: Inhale 2 puffs into the lungs 2 (two) times daily. For next 2 months    Dispense:  1 Inhaler    Refill:  12  . fluticasone (FLONASE) 50 MCG/ACT nasal spray    Sig: Place 1 spray into both nostrils 2 (two) times daily. For next 2-3 months    Dispense:  15.8 g    Refill:  2  HTN not on meds as rxed Start metoiprolol(see chart)  I have completed the patient encounter in its entirety as documented by the scribe, with editing by me where necessary. Robert P. Doolittle, M.D.   

## 2015-04-19 ENCOUNTER — Telehealth: Payer: Self-pay

## 2015-04-19 NOTE — Telephone Encounter (Signed)
PA needed for QVAR. According to PA form, pt needs to have tried/failed preferred meds Asmanex and Flovent Diskus or HFA. I don't see any record that pt has tried either of these. Can we change Rx to one of these?

## 2015-04-20 MED ORDER — MOMETASONE FUROATE 220 MCG/INH IN AEPB: 2.0000 | INHALATION_SPRAY | Freq: Every day | RESPIRATORY_TRACT | Status: DC

## 2015-04-20 NOTE — Telephone Encounter (Signed)
Pt.notified

## 2015-04-20 NOTE — Telephone Encounter (Signed)
I will change to Asmanex.

## 2015-05-22 ENCOUNTER — Telehealth: Payer: Self-pay | Admitting: Emergency Medicine

## 2015-05-22 ENCOUNTER — Encounter: Payer: Self-pay | Admitting: Internal Medicine

## 2015-05-22 DIAGNOSIS — E559 Vitamin D deficiency, unspecified: Secondary | ICD-10-CM | POA: Insufficient documentation

## 2015-05-22 NOTE — Telephone Encounter (Signed)
-----   Message from Pecola Lawless, MD sent at 05/22/2015  7:54 AM EDT ----- DM uncontrolled; F/U recommended

## 2015-05-22 NOTE — Telephone Encounter (Signed)
Spoke with pt and scheduled appt for July 11th at [facility]

## 2015-06-13 ENCOUNTER — Ambulatory Visit (INDEPENDENT_AMBULATORY_CARE_PROVIDER_SITE_OTHER): Payer: BLUE CROSS/BLUE SHIELD | Admitting: Internal Medicine

## 2015-06-13 ENCOUNTER — Encounter: Payer: Self-pay | Admitting: Internal Medicine

## 2015-06-13 VITALS — BP 132/90 | HR 71 | Temp 97.7°F | Resp 16 | Wt 155.0 lb

## 2015-06-13 DIAGNOSIS — E782 Mixed hyperlipidemia: Secondary | ICD-10-CM | POA: Diagnosis not present

## 2015-06-13 DIAGNOSIS — J310 Chronic rhinitis: Secondary | ICD-10-CM

## 2015-06-13 DIAGNOSIS — E1165 Type 2 diabetes mellitus with hyperglycemia: Secondary | ICD-10-CM | POA: Diagnosis not present

## 2015-06-13 DIAGNOSIS — R059 Cough, unspecified: Secondary | ICD-10-CM | POA: Insufficient documentation

## 2015-06-13 DIAGNOSIS — IMO0002 Reserved for concepts with insufficient information to code with codable children: Secondary | ICD-10-CM

## 2015-06-13 DIAGNOSIS — R05 Cough: Secondary | ICD-10-CM

## 2015-06-13 DIAGNOSIS — E559 Vitamin D deficiency, unspecified: Secondary | ICD-10-CM

## 2015-06-13 MED ORDER — FENOFIBRIC ACID 105 MG PO TABS
105.0000 mg | ORAL_TABLET | Freq: Every day | ORAL | Status: DC
Start: 2015-06-13 — End: 2015-08-09

## 2015-06-13 NOTE — Assessment & Plan Note (Signed)
Meds declined  TLC with A1c in Oct

## 2015-06-13 NOTE — Assessment & Plan Note (Signed)
Nasal hygiene See AVS

## 2015-06-13 NOTE — Assessment & Plan Note (Signed)
[facility]

## 2015-06-13 NOTE — Progress Notes (Signed)
   Subjective:    Patient ID: Savannah Rogers, female    DOB: February 09, 1966, 49 y.o.   MRN: [id number]  HPI Labs were performed at her gynecologist office 05/03/15. These revealed an A1c of 7.7%; total cholesterol 249; triglycerides 282; HDL 50; and LDL 143. TSH was therapeutic at 2.37. Serum glucose was only 134.   She is checking her fasting blood sugars daily; the range is 101-130. She's checked postprandial one time and it was 110. She did not believe the A1c & has brought a new monitor and supplies.  She returned from [name] in March. She has not been exercising since that time until last week. 5 days last week she began to excise 45-75 minutes on the treadmill, stationary bike, or yoga. She describes her diet as increased fiber and low sugar. Meat intake is mainly lamb and chicken. She had discontinued her fenofibrate due to cost.  She's gained 10 pounds. Other than occasional dyspepsia and cough with some scant white sputum she's asymptomatic. The cough is in the setting of postnasal drainage but not other extrinsic symptoms or upper respiratory tract infection symptoms.   Review of Systems  Chest pain, palpitations, tachycardia, exertional dyspnea, paroxysmal nocturnal dyspnea, claudication or edema are absent. No unexplained weight loss, abdominal pain, dysphagia, melena, rectal bleeding, or persistently small caliber stools. Dysuria, pyuria, hematuria, frequency, nocturia or polyuria are denied. Change in hair, skin, nails denied. No bowel changes of constipation or diarrhea. No intolerance to heat or cold. Frontal headache, facial pain , nasal purulence, dental pain, sore throat , otic pain or otic discharge denied. No fever , chills or sweats. Extrinsic symptoms of itchy, watery eyes, sneezing, or angioedema are denied. There is no  Wheezing or  paroxysmal nocturnal dyspnea.     Objective:   Physical Exam  Pertinent or positive findings include:  Bilateral wax collections are  present in the otic canals. There is marked erythema of the nasal mucosa. She has crepitus of the knees. Pedal pulses are decreased. There are no ischemic changes over the lower extremities.  General appearance :adequately nourished; in no distress.BMI: 29.3  Eyes: No conjunctival inflammation or scleral icterus is present.  Oral exam:  Lips and gums are healthy appearing.There is no oropharyngeal erythema or exudate noted. Dental hygiene is good.  Heart:  Normal rate and regular rhythm. S1 and S2 normal without gallop, murmur, click, rub or other extra sounds    Lungs:Chest clear to auscultation; no wheezes, rhonchi,rales ,or rubs present.No increased work of breathing.   Abdomen: bowel sounds normal, soft and non-tender without masses, organomegaly or hernias noted.  No guarding or rebound.  Vascular : all pulses equal ; no bruits present.  Skin:Warm & dry.  Intact without suspicious lesions or rashes ; no tenting   Lymphatic: No lymphadenopathy is noted about the head, neck, axilla  Neuro: Strength, tone & DTRs normal.        Assessment & Plan:  See Current Assessment & Plan in Problem List under specific Diagnosis

## 2015-06-13 NOTE — Patient Instructions (Addendum)
  A1c assesses average 24 hour  glucose over prior 6-12 weeks.  No Diabetes risk if < 6.1%  "Pre Diabetes" :6.2-6.4 % Good diabetic control: 6.5-7 % Fair diabetic control: 7-8 % Poor diabetic control: greater than 8 % ( except with additional factors such as  advanced age; significant coronary or neurologic disease,etc).  Your present value is  7.7 %. An  A1c of  8 % or less  is the safest goal for you.Preferred is < 7 % as long as there are no low blood glucose events.  Goals for home glucose monitoring are : fasting  or morning glucose goal of  100-150. Ninety minutes after any meal , goal = < 180, preferably < 160. Report any low blood glucoses immediately. Urine microalbumin assesses  microscopic kidney disease from hypertension or Diabetes; Diabetes & Hypertension control will prevent or reverse any progression. Please bring your diary of glucose readings; blood pressure readings; & all actual prescription medications & supplements or updated correct list to your appointment.   The following nutritional changes may help prevent Diabetes progression & complications.  White carbohydrates (potatoes, rice, bread, and pasta) cause a high spike of the sugar level which stays elevated for a significant period of time (called sugar"load").  For example a  baked potato has a cup of sugar and a  french fry  2 teaspoons of sugar.  More complex carbs such as yams, wild  rice, whole grained bread &  wheat pasta have been much lower spike and persistent load of sugar than the white carbs. The pancreas excretes excess insulin in response to the high spike & load of sugar . Over time the pancreas can actually run out of insulin necessitating insulin shots. Cardiovascular exercise, this can be as simple a program as walking, is recommended 30-45 minutes 3-4 times per week. If you're not exercising you should take 6-8 weeks to build up to this level.   Plain Mucinex (NOT D) for thick secretions ;force NON  dairy fluids .   Nasal cleansing in the shower as discussed with lather of mild shampoo.After 10 seconds wash off lather while  exhaling through nostrils. Make sure that all residual soap is removed to prevent irritation.  Fluticasone 1 spray in each nostril twice a day as needed. Use the "crossover" technique into opposite nostril spraying toward opposite ear @ 45 degree angle, not straight up into nostril.  Use a Neti pot daily only  as needed for significant sinus congestion; going from open side to congested side . Plain Allegra (NOT D )  160 daily , Loratidine 10 mg , OR Zyrtec 10 mg @ bedtime  as needed for itchy eyes & sneezing.

## 2015-06-13 NOTE — Assessment & Plan Note (Signed)
Fenofibric 105  Labs in Oct

## 2015-06-13 NOTE — Progress Notes (Signed)
Pre visit review using our clinic review tool, if applicable. No additional management support is needed unless otherwise documented below in the visit note. 

## 2015-06-14 NOTE — Assessment & Plan Note (Signed)
See AVS for  nasal hygiene regimen

## 2015-08-09 ENCOUNTER — Other Ambulatory Visit: Payer: Self-pay | Admitting: Emergency Medicine

## 2015-08-09 ENCOUNTER — Telehealth: Payer: Self-pay | Admitting: Emergency Medicine

## 2015-08-09 DIAGNOSIS — E782 Mixed hyperlipidemia: Secondary | ICD-10-CM

## 2015-08-09 MED ORDER — FENOFIBRIC ACID 105 MG PO TABS: 105.0000 mg | ORAL_TABLET | Freq: Every day | ORAL | Status: DC

## 2015-08-09 NOTE — Telephone Encounter (Signed)
PA for Fenofibric Acid on 08/09/15

## 2015-08-14 ENCOUNTER — Other Ambulatory Visit: Payer: Self-pay | Admitting: Emergency Medicine

## 2015-08-14 ENCOUNTER — Telehealth: Payer: Self-pay | Admitting: Emergency Medicine

## 2015-08-14 MED ORDER — FENOFIBRATE 145 MG PO TABS: 145.0000 mg | ORAL_TABLET | Freq: Every day | ORAL | Status: DC

## 2015-08-14 NOTE — Telephone Encounter (Signed)
Prior Auth for Fenofibric Acid was denied. Plan request that pt tries [place] and Tricor before Fenofibric. Please advise

## 2015-08-14 NOTE — Telephone Encounter (Signed)
Tricor OK @ equivalent dose X 3 mos

## 2015-08-14 NOTE — Telephone Encounter (Signed)
Rx sent in for Tricor to replace Fenofibric.

## 2015-08-16 ENCOUNTER — Other Ambulatory Visit: Payer: Self-pay | Admitting: Internal Medicine

## 2015-09-22 ENCOUNTER — Other Ambulatory Visit: Payer: Self-pay | Admitting: Internal Medicine

## 2015-09-24 ENCOUNTER — Other Ambulatory Visit: Payer: Self-pay | Admitting: Internal Medicine

## 2015-09-26 ENCOUNTER — Other Ambulatory Visit (INDEPENDENT_AMBULATORY_CARE_PROVIDER_SITE_OTHER): Payer: BLUE CROSS/BLUE SHIELD

## 2015-09-26 DIAGNOSIS — E782 Mixed hyperlipidemia: Secondary | ICD-10-CM

## 2015-09-26 DIAGNOSIS — E1165 Type 2 diabetes mellitus with hyperglycemia: Secondary | ICD-10-CM

## 2015-09-26 LAB — BASIC METABOLIC PANEL
BUN: 15 mg/dL (ref 6–23)
CO2: 24 mEq/L (ref 19–32)
Calcium: 9.8 mg/dL (ref 8.4–10.5)
Chloride: 103 mEq/L (ref 96–112)
Creatinine, Ser: 0.88 mg/dL (ref 0.40–1.20)
GFR: 72.08 mL/min (ref >60.00)
Glucose, Bld: 143 mg/dL — ABNORMAL HIGH (ref 70–99)
Potassium: 4.1 mEq/L (ref 3.5–5.1)
Sodium: 139 mEq/L (ref 135–145)

## 2015-09-26 LAB — HEPATIC FUNCTION PANEL
ALT: 38 U/L — ABNORMAL HIGH (ref 0–35)
AST: 31 U/L (ref 0–37)
Albumin: 4.3 g/dL (ref 3.5–5.2)
Alkaline Phosphatase: 60 U/L (ref 39–117)
Bilirubin, Direct: 0.1 mg/dL (ref 0.0–0.3)
Total Bilirubin: 0.4 mg/dL (ref 0.2–1.2)
Total Protein: 7.8 g/dL (ref 6.0–8.3)

## 2015-09-26 LAB — LIPID PANEL
Cholesterol: 217 mg/dL — ABNORMAL HIGH (ref 0–200)
HDL: 42.3 mg/dL (ref >39.00)
NonHDL: 174.7
Total CHOL/HDL Ratio: 5
Triglycerides: 251 mg/dL — ABNORMAL HIGH (ref 0.0–149.0)
VLDL: 50.2 mg/dL — ABNORMAL HIGH (ref 0.0–40.0)

## 2015-09-26 LAB — MICROALBUMIN / CREATININE URINE RATIO
Creatinine,U: 146.6 mg/dL
Microalb Creat Ratio: 1.2 mg/g (ref 0.0–30.0)
Microalb, Ur: 1.8 mg/dL (ref 0.0–1.9)

## 2015-09-26 LAB — LDL CHOLESTEROL, DIRECT: Direct LDL: 147 mg/dL

## 2015-09-26 LAB — HEMOGLOBIN A1C: Hgb A1c MFr Bld: 7.3 % — ABNORMAL HIGH (ref 4.6–6.5)

## 2015-09-28 ENCOUNTER — Telehealth: Payer: Self-pay | Admitting: Internal Medicine

## 2015-09-28 NOTE — Telephone Encounter (Signed)
Pt called request lab result that was done 09/26/15. Please give her a call back

## 2015-10-02 NOTE — Telephone Encounter (Signed)
Pt received results via MyChart, has an appt with Dr Alfonse FlavorsHopp next week.

## 2015-10-10 ENCOUNTER — Encounter: Payer: Self-pay | Admitting: Internal Medicine

## 2015-10-10 ENCOUNTER — Other Ambulatory Visit (INDEPENDENT_AMBULATORY_CARE_PROVIDER_SITE_OTHER): Payer: BLUE CROSS/BLUE SHIELD

## 2015-10-10 ENCOUNTER — Ambulatory Visit (INDEPENDENT_AMBULATORY_CARE_PROVIDER_SITE_OTHER): Payer: BLUE CROSS/BLUE SHIELD | Admitting: Internal Medicine

## 2015-10-10 VITALS — BP 128/78 | HR 56 | Temp 98.3°F | Resp 16 | Ht 62.0 in | Wt 154.8 lb

## 2015-10-10 DIAGNOSIS — E790 Hyperuricemia without signs of inflammatory arthritis and tophaceous disease: Secondary | ICD-10-CM

## 2015-10-10 DIAGNOSIS — I10 Essential (primary) hypertension: Secondary | ICD-10-CM | POA: Diagnosis not present

## 2015-10-10 DIAGNOSIS — E782 Mixed hyperlipidemia: Secondary | ICD-10-CM | POA: Diagnosis not present

## 2015-10-10 DIAGNOSIS — E1165 Type 2 diabetes mellitus with hyperglycemia: Secondary | ICD-10-CM | POA: Diagnosis not present

## 2015-10-10 LAB — TSH: TSH: 1.99 u[IU]/mL (ref 0.35–4.50)

## 2015-10-10 LAB — URIC ACID: Uric Acid, Serum: 3 mg/dL (ref 2.4–7.0)

## 2015-10-10 MED ORDER — METFORMIN HCL 500 MG PO TABS: 500.0000 mg | ORAL_TABLET | Freq: Two times a day (BID) | ORAL | Status: DC

## 2015-10-10 MED ORDER — ATORVASTATIN CALCIUM 10 MG PO TABS: 10.0000 mg | ORAL_TABLET | Freq: Every day | ORAL | Status: DC

## 2015-10-10 MED ORDER — LOSARTAN POTASSIUM 50 MG PO TABS: 50.0000 mg | ORAL_TABLET | Freq: Every day | ORAL | Status: DC

## 2015-10-10 NOTE — Assessment & Plan Note (Addendum)
Metformin 500 mg twice a day after meals  A1c in 12-14 weeks  Add low dose losartan

## 2015-10-10 NOTE — Progress Notes (Signed)
Pre visit review using our clinic review tool, if applicable. No additional management support is needed unless otherwise documented below in the visit note. 

## 2015-10-10 NOTE — Assessment & Plan Note (Addendum)
Uric acid  Add low dose losartan in place of Beta blocker

## 2015-10-10 NOTE — Patient Instructions (Signed)
   Please see changes in medications; continue excellent nutrition and exercise program. Recheck fasting labs in 12 weeks.

## 2015-10-10 NOTE — Assessment & Plan Note (Signed)
TSH 

## 2015-10-10 NOTE — Progress Notes (Signed)
   Subjective:    Patient ID: Savannah Rogers, female    DOB: 05/01/1966, 49 y.o.   MRN: [id number]  HPI The patient is here to assess status of active health conditions.  PMH, FH, & Social History reviewed & updated.No change in FH as recorded.  She ran out of her Metroprolol for 2 weeks. Blood pressure has ranged 140-150/76-86. Blood pressure can be elevated prior to menses;it has been as high as 163/90. She will have occasional headaches prior to menses as well.  FBS are 119-130; pr meal glucose 75-120.  She's been on a low glycemic carb diet and modified heart healthy. She rides a bike for an hour 7 days a week.She denies any associated cardio pulmonary symptoms.  Her most recent labs 09/26/59 revealed fasting blood sugar of 143; LDL of 147; triglycerides 409251; an A1c of 7.3%. This was down from 7.7%.  In December 2015 TSH was 2.23. Uric acid was 5.3.  Review of Systems  Chest pain, palpitations, tachycardia, exertional dyspnea, paroxysmal nocturnal dyspnea, claudication or edema are absent. No unexplained weight loss, abdominal pain, significant dyspepsia, dysphagia, melena, rectal bleeding, or persistently small caliber stools. Dysuria, pyuria, hematuria, frequency, nocturia or polyuria are denied. Change in hair, skin, nails denied. No bowel changes of constipation or diarrhea. No intolerance to heat or cold.No polydipsia or polyphagia. No numbness, tingling, burning in the extremities. No nonhealing skin lesions present.     Objective:   Physical Exam   Pertinent or positive findings include: Some dental staining is present. Abdomen is protuberant.  General appearance :adequately nourished; in no distress.  Eyes: No conjunctival inflammation or scleral icterus is present.  Oral exam:  Lips and gums are healthy appearing.There is no oropharyngeal erythema or exudate noted.   Heart:  Normal rate and regular rhythm. S1 and S2 normal without gallop, murmur, click, rub or other  extra sounds    Lungs:Chest clear to auscultation; no wheezes, rhonchi,rales ,or rubs present.No increased work of breathing.   Abdomen: bowel sounds normal, soft and non-tender without masses, organomegaly or hernias noted.  No guarding or rebound.   Vascular : all pulses equal ; no bruits present.  Skin:Warm & dry.  Intact without suspicious lesions or rashes ; no tenting .  Lymphatic: No lymphadenopathy is noted about the head, neck, axilla.   Neuro: Strength, tone & DTRs normal.     Assessment & Plan:  See Current Assessment & Plan in Problem List under specific Diagnosis

## 2015-10-23 ENCOUNTER — Other Ambulatory Visit: Payer: Self-pay | Admitting: Internal Medicine

## 2015-12-12 ENCOUNTER — Other Ambulatory Visit: Payer: Self-pay | Admitting: Internal Medicine

## 2016-01-17 ENCOUNTER — Other Ambulatory Visit (INDEPENDENT_AMBULATORY_CARE_PROVIDER_SITE_OTHER): Payer: BLUE CROSS/BLUE SHIELD

## 2016-01-17 DIAGNOSIS — E790 Hyperuricemia without signs of inflammatory arthritis and tophaceous disease: Secondary | ICD-10-CM

## 2016-01-17 DIAGNOSIS — E782 Mixed hyperlipidemia: Secondary | ICD-10-CM

## 2016-01-17 DIAGNOSIS — E1165 Type 2 diabetes mellitus with hyperglycemia: Secondary | ICD-10-CM

## 2016-01-17 LAB — URIC ACID: Uric Acid, Serum: 5.5 mg/dL (ref 2.4–7.0)

## 2016-01-17 LAB — HEPATIC FUNCTION PANEL
ALT: 36 U/L — ABNORMAL HIGH (ref 0–35)
AST: 23 U/L (ref 0–37)
Albumin: 4.2 g/dL (ref 3.5–5.2)
Alkaline Phosphatase: 72 U/L (ref 39–117)
Bilirubin, Direct: 0.1 mg/dL (ref 0.0–0.3)
Total Bilirubin: 0.4 mg/dL (ref 0.2–1.2)
Total Protein: 7.3 g/dL (ref 6.0–8.3)

## 2016-01-17 LAB — LIPID PANEL
Cholesterol: 139 mg/dL (ref 0–200)
HDL: 47 mg/dL (ref >39.00)
LDL Cholesterol: 60 mg/dL (ref 0–99)
NonHDL: 92.23
Total CHOL/HDL Ratio: 3
Triglycerides: 163 mg/dL — ABNORMAL HIGH (ref 0.0–149.0)
VLDL: 32.6 mg/dL (ref 0.0–40.0)

## 2016-01-17 LAB — HEMOGLOBIN A1C: Hgb A1c MFr Bld: 6.4 % (ref 4.6–6.5)

## 2016-01-22 ENCOUNTER — Encounter: Payer: Self-pay | Admitting: Internal Medicine

## 2016-01-22 ENCOUNTER — Ambulatory Visit (INDEPENDENT_AMBULATORY_CARE_PROVIDER_SITE_OTHER): Payer: BLUE CROSS/BLUE SHIELD | Admitting: Internal Medicine

## 2016-01-22 VITALS — BP 124/82 | HR 75 | Temp 98.9°F | Resp 20 | Wt 160.0 lb

## 2016-01-22 DIAGNOSIS — E1165 Type 2 diabetes mellitus with hyperglycemia: Secondary | ICD-10-CM

## 2016-01-22 DIAGNOSIS — E782 Mixed hyperlipidemia: Secondary | ICD-10-CM | POA: Diagnosis not present

## 2016-01-22 DIAGNOSIS — R030 Elevated blood-pressure reading, without diagnosis of hypertension: Secondary | ICD-10-CM

## 2016-01-22 NOTE — Progress Notes (Signed)
Pre visit review using our clinic review tool, if applicable. No additional management support is needed unless otherwise documented below in the visit note. 

## 2016-01-22 NOTE — Patient Instructions (Signed)
Please continue all other medications as before, and refills have been done if requested.  Please have the pharmacy call with any other refills you may need.  Please continue your efforts at being more active, low cholesterol diet, and weight control.  You are otherwise up to date with prevention measures today.  Please keep your appointments with your specialists as you may have planned  OK to re-schedule your appointment with Dr Lawerance Bach for 3 months from now

## 2016-01-22 NOTE — Progress Notes (Signed)
Subjective:    Patient ID: Savannah Rogers, female    DOB: 27-Jun-1966, 50 y.o.   MRN: [id number]  HPI     Here to f/u with me today as pt was advised for f/u appt but is in the transition to new PCP, Dr Quay Burow who has not yet had availability.  overall doing ok,  Pt denies chest pain, increasing sob or doe, wheezing, orthopnea, PND, increased LE swelling, palpitations, dizziness or syncope.  Pt denies new neurological symptoms such as new headache, or facial or extremity weakness or numbness.  Pt denies polydipsia, polyuria, or low sugar episode.   Pt denies new neurological symptoms such as new headache, or facial or extremity weakness or numbness.   Pt states overall good compliance with meds, mostly trying to follow appropriate diet, with wt overall stable,  but little exercise however. Past Medical History  Diagnosis Date  . Hyperlipidemia     NMR 05-2010:ldl 55(1415/711),HDL 48, TG 173. NO FH of MI.[place] study LDL goal  LDL goal =<160  . Hyperuricemia    Past Surgical History  Procedure Laterality Date  . Bone marrow biopsy  2006    for elevated WBC  . G3  p1  a2    . Laparascopic cholecystectomy  03/2012    Dr Harlow Asa  . Cholecystectomy  03/16/2012    INTRAOPERATIVE CHOLANGIOGRAM;  Surgeon: Earnstine Regal, MD;  Location: [facility];  Service: General;  Laterality: N/A;    reports that she has never smoked. She has never used smokeless tobacco. She reports that she does not drink alcohol or use illicit drugs. family history includes Breast cancer in her maternal aunt; Hypertension in her father and mother; Stroke (age of onset: 46) in her paternal grandmother; Stroke (age of onset: 87) in her paternal grandfather. There is no history of Diabetes or Heart disease. No Known Allergies Current Outpatient Prescriptions on File Prior to Visit  Medication Sig Dispense Refill  . allopurinol (ZYLOPRIM) 300 MG tablet TAKE 1 TABLET (300 MG TOTAL) BY MOUTH DAILY. 90 tablet 1  . atorvastatin (LIPITOR)  10 MG tablet Take 1 tablet (10 mg total) by mouth daily. 30 tablet 3  . beclomethasone (QVAR) 40 MCG/ACT inhaler Inhale 2 puffs into the lungs 2 (two) times daily. For next 2 months 1 Inhaler 12  . cetirizine (ZYRTEC) 10 MG tablet Take 1 tablet (10 mg total) by mouth daily. 30 tablet 11  . fluticasone (FLONASE) 50 MCG/ACT nasal spray Place 1 spray into both nostrils 2 (two) times daily. For next 2-3 months 15.8 g 2  . losartan (COZAAR) 50 MG tablet Take 1 tablet (50 mg total) by mouth daily. 30 tablet 5  . metFORMIN (GLUCOPHAGE) 500 MG tablet Take 1 tablet (500 mg total) by mouth 2 (two) times daily with a meal. 180 tablet 3  . montelukast (SINGULAIR) 10 MG tablet Take 1 tablet (10 mg total) by mouth at bedtime. Must estab w/new provider for future refills 30 tablet 3  . fluticasone (FLONASE) 50 MCG/ACT nasal spray Place 2 sprays into the nose daily. 16 g 4   No current facility-administered medications on file prior to visit.   Review of Systems  Constitutional: Negative for unusual diaphoresis or night sweats HENT: Negative for ringing in ear or discharge Eyes: Negative for double vision or worsening visual disturbance.  Respiratory: Negative for choking and stridor.   Gastrointestinal: Negative for vomiting or other signifcant bowel change Genitourinary: Negative for hematuria or change in urine volume.  Musculoskeletal: Negative for other MSK pain or swelling Skin: Negative for color change and worsening wound.  Neurological: Negative for tremors and numbness other than noted  Psychiatric/Behavioral: Negative for decreased concentration or agitation other than above       Objective:   Physical Exam BP 124/82 mmHg  Pulse 75  Temp(Src) 98.9 F (37.2 C) (Oral)  Resp 20  Wt 160 lb (72.576 kg)  SpO2 97% VS noted, not ill appearing Constitutional: Pt appears in no significant distress HENT: Head: NCAT.  Right Ear: External ear normal.  Left Ear: External ear normal.  Eyes: .  Pupils are equal, round, and reactive to light. Conjunctivae and EOM are normal Neck: Normal range of motion. Neck supple.  Cardiovascular: Normal rate and regular rhythm.   Pulmonary/Chest: Effort normal and breath sounds without rales or wheezing.  Neurological: Pt is alert. Not confused , motor grossly intact Skin: Skin is warm. No rash, no LE edema Psychiatric: Pt behavior is normal. No agitation.     Assessment & Plan:

## 2016-01-23 NOTE — Assessment & Plan Note (Signed)
stable overall by history and exam, recent data reviewed with pt, and pt to continue medical treatment as before,  to f/u any worsening symptoms or concerns Lab Results  Component Value Date   LDLCALC 60 01/17/2016

## 2016-01-23 NOTE — Assessment & Plan Note (Signed)
stable overall by history and exam, recent data reviewed with pt, and pt to continue medical treatment as before,  to f/u any worsening symptoms or concerns BP Readings from Last 3 Encounters:  01/22/16 124/82  10/10/15 128/78  06/13/15 132/90

## 2016-01-23 NOTE — Assessment & Plan Note (Signed)
stable overall by history and exam, recent data reviewed with pt, and pt to continue medical treatment as before,  to f/u any worsening symptoms or concerns Lab Results  Component Value Date   HGBA1C 6.4 01/17/2016    

## 2016-02-01 ENCOUNTER — Other Ambulatory Visit: Payer: Self-pay | Admitting: Internal Medicine

## 2016-03-02 ENCOUNTER — Other Ambulatory Visit: Payer: Self-pay | Admitting: Internal Medicine

## 2016-03-05 ENCOUNTER — Ambulatory Visit (INDEPENDENT_AMBULATORY_CARE_PROVIDER_SITE_OTHER): Payer: BLUE CROSS/BLUE SHIELD | Admitting: Internal Medicine

## 2016-03-05 ENCOUNTER — Encounter: Payer: Self-pay | Admitting: Internal Medicine

## 2016-03-05 VITALS — BP 138/84 | HR 79 | Temp 98.0°F | Resp 16 | Wt 150.0 lb

## 2016-03-05 DIAGNOSIS — E1159 Type 2 diabetes mellitus with other circulatory complications: Secondary | ICD-10-CM | POA: Insufficient documentation

## 2016-03-05 DIAGNOSIS — E782 Mixed hyperlipidemia: Secondary | ICD-10-CM | POA: Diagnosis not present

## 2016-03-05 DIAGNOSIS — E1165 Type 2 diabetes mellitus with hyperglycemia: Secondary | ICD-10-CM | POA: Diagnosis not present

## 2016-03-05 DIAGNOSIS — I10 Essential (primary) hypertension: Secondary | ICD-10-CM | POA: Diagnosis not present

## 2016-03-05 DIAGNOSIS — E790 Hyperuricemia without signs of inflammatory arthritis and tophaceous disease: Secondary | ICD-10-CM | POA: Diagnosis not present

## 2016-03-05 MED ORDER — LOSARTAN POTASSIUM 50 MG PO TABS: 50.0000 mg | ORAL_TABLET | Freq: Every day | ORAL | Status: DC

## 2016-03-05 MED ORDER — ATORVASTATIN CALCIUM 10 MG PO TABS: 10.0000 mg | ORAL_TABLET | Freq: Every day | ORAL | Status: DC

## 2016-03-05 MED ORDER — MONTELUKAST SODIUM 10 MG PO TABS: 10.0000 mg | ORAL_TABLET | Freq: Every day | ORAL | Status: DC

## 2016-03-05 MED ORDER — ALLOPURINOL 300 MG PO TABS: ORAL_TABLET | ORAL | Status: DC

## 2016-03-05 MED ORDER — METFORMIN HCL 500 MG PO TABS: 500.0000 mg | ORAL_TABLET | Freq: Every day | ORAL | Status: DC

## 2016-03-05 NOTE — Assessment & Plan Note (Signed)
BP well controlled Current regimen effective and well tolerated Continue current medications at current doses  

## 2016-03-05 NOTE — Progress Notes (Signed)
Subjective:    Patient ID: Savannah Rogers, female    DOB: 10/22/1966, 50 y.o.   MRN: [id number]  HPI She is here to establish with a new pcp.   Hypertension: She is taking her medication daily. She is compliant with a low sodium diet.  She denies chest pain, palpitations, edema, shortness of breath and regular headaches. She is exercising regularly, 5 days a week.  She does monitor her blood pressure at home and it is very well controlled 120/74.     Diabetes: She is taking her medication daily as prescribed. She is compliant with a diabetic diet. She is exercising regularly. She monitors her sugars and they have been running less than 120.  She is up-to-date with an ophthalmology examination.   Hyperlipidemia: She is taking her medication daily. She is compliant with a low fat/cholesterol diet. She is exercising regularly. She denies myalgias.   Hyperuricemia: She is taking allopurinol daily as prescribed. Her father has a history of gout, but she denies any personal history.  Medications and allergies reviewed with patient and updated if appropriate.  Patient Active Problem List   Diagnosis Date Noted  . Cough 06/13/2015  . Non-allergic rhinitis 06/13/2015  . Vitamin D deficiency 05/22/2015  . Elevated blood pressure reading without diagnosis of hypertension 12/05/2014  . Diabetes mellitus type 2, uncontrolled ([place]) 12/04/2014  . Knee pain, right 07/21/2014  . Hyperuricemia 03/24/2013  . Elevated white blood cell count 03/24/2013  . Cholelithiasis with cholecystitis 04/13/2012  . Mixed hyperlipidemia 03/06/2008    Current Outpatient Prescriptions on File Prior to Visit  Medication Sig Dispense Refill  . allopurinol (ZYLOPRIM) 300 MG tablet TAKE 1 TABLET (300 MG TOTAL) BY MOUTH DAILY. 90 tablet 1  . atorvastatin (LIPITOR) 10 MG tablet TAKE 1 TABLET BY MOUTH DAILY 30 tablet 3  . beclomethasone (QVAR) 40 MCG/ACT inhaler Inhale 2 puffs into the lungs 2 (two) times daily. For next  2 months 1 Inhaler 12  . cetirizine (ZYRTEC) 10 MG tablet Take 1 tablet (10 mg total) by mouth daily. 30 tablet 11  . fluticasone (FLONASE) 50 MCG/ACT nasal spray Place 1 spray into both nostrils 2 (two) times daily. For next 2-3 months 15.8 g 2  . losartan (COZAAR) 50 MG tablet TAKE 1 TABLET BY MOUTH DAILY 30 tablet 2  . metFORMIN (GLUCOPHAGE) 500 MG tablet Take 1 tablet (500 mg total) by mouth 2 (two) times daily with a meal. 180 tablet 3  . montelukast (SINGULAIR) 10 MG tablet Take 1 tablet (10 mg total) by mouth at bedtime. Must estab w/new provider for future refills 30 tablet 3   No current facility-administered medications on file prior to visit.    Past Medical History  Diagnosis Date  . Hyperlipidemia     NMR 05-2010:ldl 55(1415/711),HDL 48, TG 173. NO FH of MI.[place] study LDL goal  LDL goal =<160  . Hyperuricemia     Past Surgical History  Procedure Laterality Date  . Bone marrow biopsy  2006    for elevated WBC  . G3  p1  a2    . Laparascopic cholecystectomy  03/2012    Dr Harlow Asa  . Cholecystectomy  03/16/2012    INTRAOPERATIVE CHOLANGIOGRAM;  Surgeon: Earnstine Regal, MD;  Location: [facility];  Service: General;  Laterality: N/A;    Social History   Social History  . Marital Status: Single    Spouse Name: N/A  . Number of Children: 1  . Years of Education: N/A  Occupational History  .  HOTEL MANAGER   . MANAGER    Social History Main Topics  . Smoking status: Never Smoker   . Smokeless tobacco: Never Used  . Alcohol Use: No  . Drug Use: No  . Sexual Activity: Not on file   Other Topics Concern  . Not on file   Social History Narrative    Family History  Problem Relation Age of Onset  . Stroke Paternal Grandmother 25  . Stroke Paternal Grandfather 69  . Hypertension Father   . Hypertension Mother   . Breast cancer Maternal Aunt   . Diabetes Neg Hx   . Heart disease Neg Hx   [facility] [phone]  Review of Systems  Constitutional:  Negative for fever.  Respiratory: Negative for cough, shortness of breath and wheezing.   Cardiovascular: Negative for chest pain, palpitations and leg swelling.  Musculoskeletal: Negative for myalgias.  Neurological: Negative for dizziness, light-headedness and headaches.       Objective:   Filed Vitals:   03/05/16 1541  BP: 138/84  Pulse: 79  Temp: 98 F (36.7 C)  Resp: 16   Filed Weights   03/05/16 1541  Weight: 150 lb (68.04 kg)   Body mass index is 27.43 kg/(m^2).   Physical Exam Constitutional: Appears well-developed and well-nourished. No distress.  Neck: Neck supple. No tracheal deviation present. No thyromegaly present.  No carotid bruit. No cervical adenopathy.   Cardiovascular: Normal rate, regular rhythm and normal heart sounds.   No murmur heard.  No edema Pulmonary/Chest: Effort normal and breath sounds normal. No respiratory distress. No wheezes.         Assessment & Plan:   See Problem List for Assessment and Plan of chronic medical problems.  Follow up in 6 months

## 2016-03-05 NOTE — Assessment & Plan Note (Signed)
No personal history of gout Check uric acid level at next visit Continue allopurinol 300 mg daily-can try to decrease in the near future

## 2016-03-05 NOTE — Patient Instructions (Addendum)
  Have blood work done one week before your next visit.   All other Health Maintenance issues reviewed.   All recommended immunizations and age-appropriate screenings are up-to-date or discussed.  No immunizations administered today.   Medications reviewed and updated.  Changes include decreasing the metformin to once a day.   Your prescription(s) have been submitted to your pharmacy. Please take as directed and contact our office if you believe you are having problem(s) with the medication(s).   Please followup in 6 months.

## 2016-03-05 NOTE — Assessment & Plan Note (Signed)
Sugars well controlled at home Exercising regularly Will try to decrease metformin to 500 mg once a day F/u in 6 months, recheck a1c then

## 2016-03-05 NOTE — Assessment & Plan Note (Signed)
Lipids improved with atorvastatin Continue atorvastatin 10 mg daily Follow-up in 6 months

## 2016-03-05 NOTE — Progress Notes (Signed)
Pre visit review using our clinic review tool, if applicable. No additional management support is needed unless otherwise documented below in the visit note. 

## 2016-03-10 DIAGNOSIS — R300 Dysuria: Secondary | ICD-10-CM | POA: Diagnosis not present

## 2016-03-25 ENCOUNTER — Other Ambulatory Visit: Payer: Self-pay | Admitting: Internal Medicine

## 2016-04-01 ENCOUNTER — Other Ambulatory Visit: Payer: Self-pay

## 2016-04-01 MED ORDER — BECLOMETHASONE DIPROPIONATE 40 MCG/ACT IN AERS: 2.0000 | INHALATION_SPRAY | Freq: Two times a day (BID) | RESPIRATORY_TRACT | Status: DC

## 2016-04-10 ENCOUNTER — Encounter: Payer: Self-pay | Admitting: Internal Medicine

## 2016-04-10 ENCOUNTER — Ambulatory Visit (INDEPENDENT_AMBULATORY_CARE_PROVIDER_SITE_OTHER)
Admission: RE | Admit: 2016-04-10 | Discharge: 2016-04-10 | Disposition: A | Discharge status: Home / Self Care | Payer: BLUE CROSS/BLUE SHIELD | Source: Ambulatory Visit | Attending: Internal Medicine | Admitting: Internal Medicine

## 2016-04-10 ENCOUNTER — Ambulatory Visit (INDEPENDENT_AMBULATORY_CARE_PROVIDER_SITE_OTHER): Payer: BLUE CROSS/BLUE SHIELD | Admitting: Internal Medicine

## 2016-04-10 ENCOUNTER — Other Ambulatory Visit (INDEPENDENT_AMBULATORY_CARE_PROVIDER_SITE_OTHER): Payer: BLUE CROSS/BLUE SHIELD

## 2016-04-10 VITALS — BP 118/70 | HR 82 | Ht 62.0 in | Wt 150.0 lb

## 2016-04-10 DIAGNOSIS — R058 Other specified cough: Secondary | ICD-10-CM

## 2016-04-10 DIAGNOSIS — R05 Cough: Secondary | ICD-10-CM | POA: Diagnosis not present

## 2016-04-10 LAB — CBC WITH DIFFERENTIAL/PLATELET
Basophils Absolute: 0.2 10*3/uL — ABNORMAL HIGH (ref 0.0–0.1)
Basophils Relative: 1 % (ref 0.0–3.0)
Eosinophils Absolute: 0.4 10*3/uL (ref 0.0–0.7)
Eosinophils Relative: 2.4 % (ref 0.0–5.0)
HCT: 41.5 % (ref 36.0–46.0)
Hemoglobin: 14.1 g/dL (ref 12.0–15.0)
Lymphocytes Relative: 24.6 % (ref 12.0–46.0)
Lymphs Abs: 4.5 10*3/uL — ABNORMAL HIGH (ref 0.7–4.0)
MCHC: 34 g/dL (ref 30.0–36.0)
MCV: 86.4 fl (ref 78.0–100.0)
Monocytes Absolute: 0.9 10*3/uL (ref 0.1–1.0)
Monocytes Relative: 5.1 % (ref 3.0–12.0)
Neutro Abs: 12.2 10*3/uL — ABNORMAL HIGH (ref 1.4–7.7)
Neutrophils Relative %: 66.9 % (ref 43.0–77.0)
Platelets: 377 10*3/uL (ref 150.0–400.0)
RBC: 4.8 Mil/uL (ref 3.87–5.11)
RDW: 13.7 % (ref 11.5–15.5)
WBC: 18.2 10*3/uL (ref 4.0–10.5)

## 2016-04-10 MED ORDER — FAMOTIDINE 20 MG PO TABS: ORAL_TABLET | ORAL | Status: DC

## 2016-04-10 MED ORDER — PREDNISONE 10 MG PO TABS: ORAL_TABLET | ORAL | Status: DC

## 2016-04-10 MED ORDER — PANTOPRAZOLE SODIUM 40 MG PO TBEC: 40.0000 mg | DELAYED_RELEASE_TABLET | Freq: Every day | ORAL | Status: DC

## 2016-04-10 NOTE — Progress Notes (Signed)
Subjective:    Patient ID: Savannah DavidsonRehana Rogers, female    DOB: 21-Nov-2047,     MRN: [id number]  HPI  50 yo [name] and lived in same house x 2006 and not trouble until around 2008 with recurrent cough self referred to pulmonary clinic 04/10/2016    04/10/2016 1st [facility] office visit/ Makyah Lavigne   Chief Complaint  Patient presents with  . Pulmonary Consult    Self referral. Pt c/o cough off and on since 2008. Cough is prod with yellow sputum.    one recurrent cough trigger is clearly the rhinitis in spring x 10 y /another assoc with sore throat then cough triggered typically by URI or jet travel to [place](3 x so far, same result) but also every night year round sensation of whistling / wheezing when supine but  To point where usually wakes her up sev times but then goes back to sleep s sitting up  but never as any trouble on treadmill. No better on qvar/ ? Makes chronic hoarseness worse/ no better on singulair Cough typically comes on suddenly then lasts weeks/ months. Does not recall rx with pred in terms of benefit or not    No obvious other patterns in day to day or daytime variability or assoc sob  or cp or chest tightness,  r overt sinus or hb symptoms. No unusual exp hx or h/o childhood pna/ asthma or knowledge of premature birth.   Also denies any obvious fluctuation of symptoms with weather or environmental changes or other aggravating or alleviating factors except as outlined above   Current Medications, Allergies, Complete Past Medical History, Past Surgical History, Family History, and Social History were reviewed in [facility] electronic medical record.         Review of Systems  Constitutional: Negative for fever, chills and unexpected weight change.  HENT: Positive for sneezing. Negative for congestion, dental problem, ear pain, nosebleeds, postnasal drip, rhinorrhea, sinus pressure, sore throat, trouble swallowing and voice change.     Eyes: Negative for visual disturbance.  Respiratory: Positive for cough. Negative for choking and shortness of breath.   Cardiovascular: Negative for chest pain and leg swelling.  Gastrointestinal: Negative for vomiting, abdominal pain and diarrhea.  Genitourinary: Negative for difficulty urinating.  Musculoskeletal: Negative for arthralgias.  Skin: Negative for rash.  Neurological: Negative for tremors, syncope and headaches.  Hematological: Does not bruise/bleed easily.       Objective:   Physical Exam  amb middle [place] female nad / occ throat clearing   Wt Readings from Last 3 Encounters:  04/10/16 150 lb (68.04 kg)  03/05/16 150 lb (68.04 kg)  01/22/16 160 lb (72.576 kg)    Vital signs reviewed  HEENT: nl dentition, turbinates, and oropharynx. Nl external ear canals without cough reflex   NECK :  without JVD/Nodes/TM/ nl carotid upstrokes bilaterally   LUNGS: no acc muscle use,  Nl contour chest which is clear to A and P bilaterally without cough on insp or exp maneuvers   CV:  RRR  no s3 or murmur or increase in P2, no edema   ABD:  soft and nontender with nl inspiratory excursion in the supine position. No bruits or organomegaly, bowel sounds nl  MS:  Nl gait/ ext warm without deformities, calf tenderness, cyanosis or clubbing No obvious joint restrictions   SKIN: warm and dry without lesions    NEURO:  alert, approp, nl sensorium with  no motor deficits  CXR PA and Lateral:   04/10/2016 :    I personally reviewed images and agree with radiology impression as follows:   No acute findings    Labs ordered 04/10/2016 /cbc with diff/ eos         Assessment & Plan:

## 2016-04-10 NOTE — Assessment & Plan Note (Addendum)
Spirometry 04/10/2016  No obstruction  - Allergy profile 04/10/2016 >  Eos 0. /  IgE   - gerd rx started 04/10/2016 >>>   The most common causes of chronic cough in immunocompetent adults include the following: upper airway cough syndrome (UACS), previously referred to as postnasal drip syndrome (PNDS), which is caused by variety of rhinosinus conditions; (2) asthma; (3) GERD; (4) chronic bronchitis from cigarette smoking or other inhaled environmental irritants; (5) nonasthmatic eosinophilic bronchitis; and (6) bronchiectasis.   These conditions, singly or in combination, have accounted for up to 94% of the causes of chronic cough in prospective studies.   Other conditions have constituted no >6% of the causes in prospective studies These have included bronchogenic carcinoma, chronic interstitial pneumonia, sarcoidosis, left ventricular failure, ACEI-induced cough, and aspiration from a condition associated with pharyngeal dysfunction.    Chronic cough is often simultaneously caused by more than one condition. A single cause has been found from 38 to 82% of the time, multiple causes from 18 to 62%. Multiply caused cough has been the result of three diseases up to 42% of the time.       The patter of assoc with rhinitis and noct "wheeze" ever night despite absence of any finding at all by exam or spirometry to suggest bronchospams strongly argue in support of  Classic Upper airway cough syndrome, so named because it's frequently impossible to sort out how much is  CR/sinusitis with freq throat clearing (which can be related to primary GERD)   vs  causing  secondary (" extra esophageal")  GERD from wide swings in gastric pressure that occur with throat clearing, often  promoting self use of mint and menthol lozenges that reduce the lower esophageal sphincter tone and exacerbate the problem further in a cyclical fashion.   These are the same pts (now being labeled as having "irritable larynx syndrome" by  some cough centers) who not infrequently have a history of having failed to tolerate ace inhibitors,  dry powder inhalers or biphosphonates or report having atypical reflux symptoms that don't respond to standard doses of PPI , and are easily confused as having aecopd or asthma flares by even experienced allergists/ pulmonologists.  Will try max rx for GERD and eval for allergic mech with allergy profile and sinus CT next   In meantime attempt to eliminate pnds with 1st gen h1 per guidelines   Total time devoted to counseling  = 35/23m review case with pt/ discussion of options/alternatives/ personally creating in presence of pt  then going over specific  Instructions directly with the pt including how to use all of the meds but in particular covering each new medication in detail (see avs)      Reviewed:  The standardized cough guidelines published in Chest by [place] in 2006 are still the best available and consist of a multiple step process (up to 12!) , not a single office visit,  and are intended  to address this problem logically,  with an alogrithm dependent on response to empiric treatment at  each progressive step  to determine a specific diagnosis with  minimal addtional testing needed. Therefore if adherence is an issue or can't be accurately verified,  it's very unlikely the standard evaluation and treatment will be successful here.    Furthermore, response to therapy (other than acute cough suppression, which should only be used short term with avoidance of narcotic containing cough syrups if possible), can be a gradual process for which the  patient is not likely to  perceive immediate benefit.

## 2016-04-10 NOTE — Patient Instructions (Signed)
Stop qvar  Prednisone 10 mg take  4 each am x 2 days,   2 each am x 2 days,  1 each am x 2 days and stop  For drainage / throat tickle try take CHLORPHENIRAMINE  4 mg - take one every 4 hours as needed - available over the counter- may cause drowsiness so start with just a bedtime dose or two and see how you tolerate it before trying in daytime    Pantoprazole (protonix) 40 mg   Take  30-60 min before first meal of the day and Pepcid (famotidine)  20 mg one @  bedtime until return to office - this is the best way to tell whether stomach acid is contributing to your problem.   GERD (REFLUX)  is an extremely common cause of respiratory symptoms just like yours , many times with no obvious heartburn at all.    It can be treated with medication, but also with lifestyle changes including elevation of the head of your bed (ideally with 6 inch  bed blocks),  Smoking cessation, avoidance of late meals, excessive alcohol, and avoid fatty foods, chocolate, peppermint, colas, red wine, and acidic juices such as orange juice.  NO MINT OR MENTHOL PRODUCTS SO NO COUGH DROPS  USE SUGARLESS CANDY INSTEAD (Jolley ranchers or Stover's or Life Savers) or even ice chips will also do - the key is to swallow to prevent all throat clearing. NO OIL BASED VITAMINS - use powdered substitutes.    Please remember to go to the lab and x-ray department downstairs for your tests - we will call you with the results when they are available.     Please schedule a follow up office visit in 6 weeks, call sooner if needed

## 2016-04-11 LAB — RESPIRATORY ALLERGY PROFILE REGION II ~~LOC~~
Allergen, Cedar tree, t12: <0.1 kU/L
Allergen, Comm Silver Birch, t9: 8.49 kU/L — ABNORMAL HIGH
Allergen, Cottonwood, t14: <0.1 kU/L
Allergen, D pternoyssinus,d7: <0.1 kU/L
Allergen, Mouse Urine Protein, e78: <0.1 kU/L
Allergen, Mulberry, t76: <0.1 kU/L
Allergen, Oak,t7: 0.14 kU/L — ABNORMAL HIGH
Alternaria Alternata: <0.1 kU/L
Aspergillus fumigatus, m3: <0.1 kU/L
Bermuda Grass: <0.1 kU/L
Box Elder IgE: <0.1 kU/L
Cat Dander: <0.1 kU/L
Cladosporium Herbarum: <0.1 kU/L
Cockroach: <0.1 kU/L
Common Ragweed: 4.39 kU/L — ABNORMAL HIGH
D. farinae: <0.1 kU/L
Dog Dander: <0.1 kU/L
Elm IgE: 0.29 kU/L — ABNORMAL HIGH
IgE (Immunoglobulin E), Serum: 253 kU/L — ABNORMAL HIGH (ref <115)
Johnson Grass: <0.1 kU/L
Pecan/Hickory Tree IgE: 25.8 kU/L — ABNORMAL HIGH
Penicillium Notatum: <0.1 kU/L
Rough Pigweed  IgE: <0.1 kU/L
Sheep Sorrel IgE: <0.1 kU/L
Timothy Grass: <0.1 kU/L

## 2016-04-14 ENCOUNTER — Other Ambulatory Visit: Payer: Self-pay | Admitting: Internal Medicine

## 2016-04-14 DIAGNOSIS — R058 Other specified cough: Secondary | ICD-10-CM

## 2016-04-14 DIAGNOSIS — R05 Cough: Secondary | ICD-10-CM

## 2016-04-14 NOTE — Progress Notes (Signed)
Quick Note:  Spoke with pt and notified of results per Dr. Wert. Pt verbalized understanding and denied any questions.  ______ 

## 2016-04-17 ENCOUNTER — Ambulatory Visit (INDEPENDENT_AMBULATORY_CARE_PROVIDER_SITE_OTHER)
Admission: RE | Admit: 2016-04-17 | Discharge: 2016-04-17 | Disposition: A | Discharge status: Home / Self Care | Payer: BLUE CROSS/BLUE SHIELD | Source: Ambulatory Visit | Attending: Internal Medicine | Admitting: Internal Medicine

## 2016-04-17 DIAGNOSIS — J3489 Other specified disorders of nose and nasal sinuses: Secondary | ICD-10-CM | POA: Diagnosis not present

## 2016-04-17 DIAGNOSIS — R05 Cough: Secondary | ICD-10-CM

## 2016-04-17 DIAGNOSIS — R058 Other specified cough: Secondary | ICD-10-CM

## 2016-04-17 DIAGNOSIS — R9389 Abnormal findings on diagnostic imaging of other specified body structures: Secondary | ICD-10-CM

## 2016-04-18 NOTE — Progress Notes (Signed)
Quick Note:  Spoke with pt and notified of results per Dr. Wert. Pt verbalized understanding and denied any questions.  ______ 

## 2016-04-19 DIAGNOSIS — R9389 Abnormal findings on diagnostic imaging of other specified body structures: Secondary | ICD-10-CM | POA: Insufficient documentation

## 2016-04-21 ENCOUNTER — Other Ambulatory Visit: Payer: Self-pay | Admitting: Internal Medicine

## 2016-04-21 DIAGNOSIS — R058 Other specified cough: Secondary | ICD-10-CM

## 2016-04-21 DIAGNOSIS — R05 Cough: Secondary | ICD-10-CM

## 2016-04-21 NOTE — Progress Notes (Signed)
Quick Note:  Spoke with pt and notified of results per Dr. Wert. Pt verbalized understanding and denied any questions.  ______ 

## 2016-04-25 ENCOUNTER — Ambulatory Visit (HOSPITAL_COMMUNITY)
Admission: RE | Admit: 2016-04-25 | Discharge: 2016-04-25 | Disposition: A | Discharge status: Home / Self Care | Payer: BLUE CROSS/BLUE SHIELD | Source: Ambulatory Visit | Attending: Internal Medicine | Admitting: Internal Medicine

## 2016-04-25 DIAGNOSIS — R05 Cough: Secondary | ICD-10-CM | POA: Insufficient documentation

## 2016-04-25 DIAGNOSIS — R9389 Abnormal findings on diagnostic imaging of other specified body structures: Secondary | ICD-10-CM

## 2016-04-25 DIAGNOSIS — R058 Other specified cough: Secondary | ICD-10-CM

## 2016-04-25 LAB — PULMONARY FUNCTION TEST
DL/VA % pred: 144 %
DL/VA: 6.55 ml/min/mmHg/L
DLCO cor % pred: 81 %
DLCO cor: 17.49 ml/min/mmHg
DLCO unc % pred: 82 %
DLCO unc: 17.86 ml/min/mmHg
FEF 25-75 Post: 3.93 L/sec
FEF 25-75 Pre: 2.94 L/sec
FEF2575-%Change-Post: 33 %
FEF2575-%Pred-Post: 151 %
FEF2575-%Pred-Pre: 113 %
FEV1-%Change-Post: 11 %
FEV1-%Pred-Post: 70 %
FEV1-%Pred-Pre: 63 %
FEV1-Post: 1.84 L
FEV1-Pre: 1.65 L
FEV1FVC-%Change-Post: 2 %
FEV1FVC-%Pred-Pre: 113 %
FEV6-%Change-Post: 8 %
FEV6-%Pred-Post: 62 %
FEV6-%Pred-Pre: 56 %
FEV6-Post: 1.97 L
FEV6-Pre: 1.81 L
FEV6FVC-%Change-Post: 0 %
FEV6FVC-%Pred-Post: 103 %
FEV6FVC-%Pred-Pre: 103 %
FVC-%Change-Post: 8 %
FVC-%Pred-Post: 60 %
FVC-%Pred-Pre: 55 %
FVC-Post: 1.97 L
FVC-Pre: 1.81 L
Post FEV1/FVC ratio: 93 %
Post FEV6/FVC ratio: 100 %
Pre FEV1/FVC ratio: 91 %
Pre FEV6/FVC Ratio: 100 %
RV % pred: 76 %
RV: 1.3 L
TLC % pred: 67 %
TLC: 3.21 L

## 2016-04-25 MED ORDER — ALBUTEROL SULFATE (2.5 MG/3ML) 0.083% IN NEBU
2.5000 mg | INHALATION_SOLUTION | Freq: Once | RESPIRATORY_TRACT | Status: AC
  Administered 2016-04-25: 2.5 mg via RESPIRATORY_TRACT

## 2016-04-29 ENCOUNTER — Ambulatory Visit: Payer: BLUE CROSS/BLUE SHIELD | Admitting: Internal Medicine

## 2016-04-29 ENCOUNTER — Encounter (HOSPITAL_COMMUNITY): Payer: BLUE CROSS/BLUE SHIELD

## 2016-04-29 NOTE — Progress Notes (Signed)
Quick Note:  Spoke with pt and notified of results per Dr. Wert. Pt verbalized understanding and denied any questions.  ______ 

## 2016-05-05 ENCOUNTER — Ambulatory Visit (INDEPENDENT_AMBULATORY_CARE_PROVIDER_SITE_OTHER): Payer: BLUE CROSS/BLUE SHIELD | Admitting: Internal Medicine

## 2016-05-05 ENCOUNTER — Encounter: Payer: Self-pay | Admitting: Internal Medicine

## 2016-05-05 VITALS — BP 100/60 | HR 69 | Ht 62.0 in | Wt 147.0 lb

## 2016-05-05 DIAGNOSIS — R05 Cough: Secondary | ICD-10-CM

## 2016-05-05 DIAGNOSIS — R938 Abnormal findings on diagnostic imaging of other specified body structures: Secondary | ICD-10-CM | POA: Diagnosis not present

## 2016-05-05 DIAGNOSIS — R9389 Abnormal findings on diagnostic imaging of other specified body structures: Secondary | ICD-10-CM

## 2016-05-05 DIAGNOSIS — R058 Other specified cough: Secondary | ICD-10-CM

## 2016-05-05 NOTE — Progress Notes (Signed)
Subjective:   Patient ID: Savannah DavidsonRehana Rogers, female    DOB: [birth year],     MRN: [id number]    Brief patient profile:  50 yo [name] female immigrated 1996 to [place] and lived in same house x 2006 and no  trouble until around 2008 with recurrent cough self referred to pulmonary clinic 04/10/2016    04/10/2016 1st [facility] office visit/ Wert   Chief Complaint  Patient presents with  . Pulmonary Consult    Self referral. Pt c/o cough off and on since 2008. Cough is prod with yellow sputum.    one recurrent cough trigger is clearly the rhinitis in spring x 10 y /another assoc with sore throat then cough triggered typically by URI or jet travel to [place](3 x so far, same result) but also every night year round sensation of whistling / wheezing when supine but  To point where usually wakes her up sev times but then goes back to sleep s sitting up  but never as any trouble on treadmill. No better on qvar/ ? Makes chronic hoarseness worse/ no better on singulair Cough typically comes on suddenly then lasts weeks/ months. Does not recall rx with pred in terms of benefit or not  rec Stop qvar Prednisone 10 mg take  4 each am x 2 days,   2 each am x 2 days,  1 each am x 2 days and stop For drainage / throat tickle try take CHLORPHENIRAMINE  4 mg - take one every 4 hours as needed - Pantoprazole (protonix) 40 mg   Take  30-60 min before first meal of the day and Pepcid (famotidine)  20 mg one @  bedtime until return to office - this is the best way to tell whether stomach acid is contributing to your problem.  GERD diet        05/05/2016  f/u ov/Wert re: recurrent cough/ ? allergic rhinitis much better  Can do treadmill  2.5 mph x one hour s tilt x years even when cough active but has resolved now on 1st gen h1 and gerd rx   No obvious day to day or daytime variability or assoc excess/ purulent sputum or mucus plugs or hemoptysis or cp or chest tightness, subjective wheeze or overt sinus or  hb symptoms. No unusual exp hx or h/o childhood pna/ asthma or knowledge of premature birth.  Sleeping ok without nocturnal  or early am exacerbation  of respiratory  c/o's or need for noct saba. Also denies any obvious fluctuation of symptoms with weather or environmental changes or other aggravating or alleviating factors except as outlined above   Current Medications, Allergies, Complete Past Medical History, Past Surgical History, Family History, and Social History were reviewed in [facility] electronic medical record.  ROS  The following are not active complaints unless bolded sore throat, dysphagia, dental problems, itching, sneezing,  nasal congestion or excess/ purulent secretions, ear ache,   fever, chills, sweats, unintended wt loss, classically pleuritic or exertional cp,  orthopnea pnd or leg swelling, presyncope, palpitations, abdominal pain, anorexia, nausea, vomiting, diarrhea  or change in bowel or bladder habits, change in stools or urine, dysuria,hematuria,  rash, arthralgias, visual complaints, headache, numbness, weakness or ataxia or problems with walking or coordination,  change in mood/affect or memory.                     Objective:   Physical Exam  amb middle [place] female nad no longer with freq  throat clearing/ all smiles   Wt Readings from Last 3 Encounters:  04/10/16 150 lb (68.04 kg)  03/05/16 150 lb (68.04 kg)  01/22/16 160 lb (72.576 kg)    Vital signs reviewed  HEENT: nl dentition, turbinates, and oropharynx. Nl external ear canals without cough reflex   NECK :  without JVD/Nodes/TM/ nl carotid upstrokes bilaterally   LUNGS: no acc muscle use,  Nl contour chest which is clear to A and P bilaterally without cough on insp or exp maneuvers   CV:  RRR  no s3 or murmur or increase in P2, no edema   ABD:  soft and nontender with nl inspiratory excursion in the supine position. No bruits or organomegaly, bowel sounds nl  MS:  Nl gait/ ext warm  without deformities, calf tenderness, cyanosis or clubbing No obvious joint restrictions   SKIN: warm and dry without lesions    NEURO:  alert, approp, nl sensorium with  no motor deficits     CXR PA and Lateral:   04/10/2016 :    I personally reviewed images and agree with radiology impression as follows:    New increase in the interstitial markings with a micronodular pattern. This could reflect atypical infection. There is no CHF or pleural effusion.         Assessment & Plan:

## 2016-05-05 NOTE — Patient Instructions (Addendum)
Change to zyrtec 10 mg one at bedtime as needed for cough, sneezing, drainage/itchy/ throat clearing   If zyrtec not working, try For drainage / throat tickle try take CHLORPHENIRAMINE  4 mg - take one every 4 hours as needed - available over the counter- may cause drowsiness so start with just a bedtime dose or two and see how you tolerate it before trying in daytime.   If not better will need to be referred to an allergist but prefer to let Dr Lawerance BachBurns make that decision and return here as needed.

## 2016-05-05 NOTE — Assessment & Plan Note (Addendum)
HRCT 04/17/16 >  mosaic pattern of ground-glass attenuation and lucency accentuated on expiratory phase imaging, compatible with air trapping from underlying small airways disease. - PFTs 53017  VC 1/90 (58%) s obstruction, dlco 82/81c  corrects to 144    I had an extended final summary discussion with the patient reviewing all relevant studies completed to date and  lasting 15 to 20 minutes of a 25 minute visit on the following issues:    No evidence of significant asthma or any other intrinsic lung dz  No further f/u needed unless stated ex tol, which is quite good, declines in any way

## 2016-05-05 NOTE — Assessment & Plan Note (Signed)
Spirometry 04/10/2016  No obstruction  - Allergy profile 04/10/2016 >  Eos 0.4 /  IgE  253 Treees / ragweed   - gerd rx started 04/10/2016 >> improved  - Sinus CT 04/17/2016 > No evidence for sinus disease.  Very difficult to sort out gerd vs AR esp given apparent viral trigger but the best rx in short term is to continue the gerd rx at least in the short run and change to 24 h h1 = zyrtec with option of allergy eval later if needed

## 2016-05-22 ENCOUNTER — Ambulatory Visit: Payer: BLUE CROSS/BLUE SHIELD | Admitting: Internal Medicine

## 2016-07-06 ENCOUNTER — Other Ambulatory Visit: Payer: Self-pay | Admitting: Internal Medicine

## 2016-07-06 DIAGNOSIS — R058 Other specified cough: Secondary | ICD-10-CM

## 2016-07-06 DIAGNOSIS — R05 Cough: Secondary | ICD-10-CM

## 2016-07-14 ENCOUNTER — Other Ambulatory Visit: Payer: Self-pay | Admitting: Obstetrics and Gynecology

## 2016-07-14 DIAGNOSIS — Z124 Encounter for screening for malignant neoplasm of cervix: Secondary | ICD-10-CM | POA: Diagnosis not present

## 2016-07-14 DIAGNOSIS — Z1231 Encounter for screening mammogram for malignant neoplasm of breast: Secondary | ICD-10-CM

## 2016-07-22 ENCOUNTER — Ambulatory Visit
Admission: RE | Admit: 2016-07-22 | Discharge: 2016-07-22 | Disposition: A | Discharge status: Home / Self Care | Payer: BLUE CROSS/BLUE SHIELD | Source: Ambulatory Visit | Attending: Obstetrics and Gynecology | Admitting: Obstetrics and Gynecology

## 2016-07-22 DIAGNOSIS — Z1231 Encounter for screening mammogram for malignant neoplasm of breast: Secondary | ICD-10-CM | POA: Diagnosis not present

## 2016-08-13 ENCOUNTER — Other Ambulatory Visit: Payer: Self-pay | Admitting: Internal Medicine

## 2016-08-13 DIAGNOSIS — R058 Other specified cough: Secondary | ICD-10-CM

## 2016-08-13 DIAGNOSIS — R05 Cough: Secondary | ICD-10-CM

## 2016-08-28 ENCOUNTER — Other Ambulatory Visit (INDEPENDENT_AMBULATORY_CARE_PROVIDER_SITE_OTHER): Payer: BLUE CROSS/BLUE SHIELD

## 2016-08-28 DIAGNOSIS — I10 Essential (primary) hypertension: Secondary | ICD-10-CM

## 2016-08-28 DIAGNOSIS — E1165 Type 2 diabetes mellitus with hyperglycemia: Secondary | ICD-10-CM | POA: Diagnosis not present

## 2016-08-28 DIAGNOSIS — E790 Hyperuricemia without signs of inflammatory arthritis and tophaceous disease: Secondary | ICD-10-CM

## 2016-08-28 DIAGNOSIS — E782 Mixed hyperlipidemia: Secondary | ICD-10-CM

## 2016-08-28 LAB — MICROALBUMIN / CREATININE URINE RATIO
Creatinine,U: 92.5 mg/dL
Microalb Creat Ratio: 1 mg/g (ref 0.0–30.0)
Microalb, Ur: 0.9 mg/dL (ref 0.0–1.9)

## 2016-08-28 LAB — COMPREHENSIVE METABOLIC PANEL
ALT: 26 U/L (ref 0–35)
AST: 18 U/L (ref 0–37)
Albumin: 4 g/dL (ref 3.5–5.2)
Alkaline Phosphatase: 70 U/L (ref 39–117)
BUN: 13 mg/dL (ref 6–23)
CO2: 27 mEq/L (ref 19–32)
Calcium: 8.9 mg/dL (ref 8.4–10.5)
Chloride: 104 mEq/L (ref 96–112)
Creatinine, Ser: 0.68 mg/dL (ref 0.40–1.20)
GFR: 96.7 mL/min (ref >60.00)
Glucose, Bld: 109 mg/dL — ABNORMAL HIGH (ref 70–99)
Potassium: 4.2 mEq/L (ref 3.5–5.1)
Sodium: 139 mEq/L (ref 135–145)
Total Bilirubin: 0.4 mg/dL (ref 0.2–1.2)
Total Protein: 7.3 g/dL (ref 6.0–8.3)

## 2016-08-28 LAB — LIPID PANEL
Cholesterol: 169 mg/dL (ref 0–200)
HDL: 43.5 mg/dL (ref >39.00)
LDL Cholesterol: 87 mg/dL (ref 0–99)
NonHDL: 125.92
Total CHOL/HDL Ratio: 4
Triglycerides: 196 mg/dL — ABNORMAL HIGH (ref 0.0–149.0)
VLDL: 39.2 mg/dL (ref 0.0–40.0)

## 2016-08-28 LAB — URIC ACID: Uric Acid, Serum: 6 mg/dL (ref 2.4–7.0)

## 2016-08-28 LAB — HEMOGLOBIN A1C: Hgb A1c MFr Bld: 6.4 % (ref 4.6–6.5)

## 2016-09-02 ENCOUNTER — Encounter: Payer: Self-pay | Admitting: Internal Medicine

## 2016-09-02 ENCOUNTER — Ambulatory Visit (INDEPENDENT_AMBULATORY_CARE_PROVIDER_SITE_OTHER): Payer: BLUE CROSS/BLUE SHIELD | Admitting: Internal Medicine

## 2016-09-02 VITALS — BP 146/82 | HR 67 | Temp 98.1°F | Resp 16 | Wt 150.0 lb

## 2016-09-02 DIAGNOSIS — E790 Hyperuricemia without signs of inflammatory arthritis and tophaceous disease: Secondary | ICD-10-CM | POA: Diagnosis not present

## 2016-09-02 DIAGNOSIS — I10 Essential (primary) hypertension: Secondary | ICD-10-CM

## 2016-09-02 DIAGNOSIS — E1165 Type 2 diabetes mellitus with hyperglycemia: Secondary | ICD-10-CM

## 2016-09-02 DIAGNOSIS — E782 Mixed hyperlipidemia: Secondary | ICD-10-CM

## 2016-09-02 NOTE — Patient Instructions (Addendum)
Blood work ordered to be done prior to your next visit.   Test(s) ordered today. Your results will be released to MyChart (or called to you) after review, usually within 72hours after test completion. If any changes need to be made, you will be notified at that same time.  All other Health Maintenance issues reviewed.   All recommended immunizations and age-appropriate screenings are up-to-date or discussed.  No immunizations administered today.   Medications reviewed and updated.  No changes recommended at this time.   Please followup in 6 months for a physical

## 2016-09-02 NOTE — Progress Notes (Signed)
Pre visit review using our clinic review tool, if applicable. No additional management support is needed unless otherwise documented below in the visit note. 

## 2016-09-02 NOTE — Progress Notes (Signed)
Subjective:    Patient ID: Savannah Rogers, female    DOB: Jun 22, 1966, 50 y.o.   MRN: [id number]  HPI The patient is here for follow up.  Diabetes: She is taking her medication daily as prescribed. She is compliant with a diabetic diet. She is exercising regularly. She monitors her sugars and they have been running 116, 119, 102. She checks her feet daily and denies foot lesions. She is up-to-date with an ophthalmology examination.   Hypertension: She is taking her medication daily. She is compliant with a low sodium diet.  She denies chest pain, palpitations, edema, shortness of breath and regular headaches. She is exercising regularly.  She does monitor her blood pressure at home and it is well controlled.    Hyperlipidemia: She is taking her medication daily. She is compliant with a low fat/cholesterol diet. She is exercising regularly. She denies myalgias.   Hyperuricemia:  She is taking allopurinol daily.  She denies a history of gout.      Medications and allergies reviewed with patient and updated if appropriate.  Patient Active Problem List   Diagnosis Date Noted  . Abnormal CXR 04/19/2016  . Upper airway cough syndrome likely related to allergic rhinitis  04/10/2016  . Essential hypertension, benign 03/05/2016  . Non-allergic rhinitis 06/13/2015  . Vitamin D deficiency 05/22/2015  . Diabetes mellitus type 2, uncontrolled ([place]) 12/04/2014  . Hyperuricemia 03/24/2013  . Cholelithiasis with cholecystitis 04/13/2012  . Mixed hyperlipidemia 03/06/2008    Current Outpatient Prescriptions on File Prior to Visit  Medication Sig Dispense Refill  . allopurinol (ZYLOPRIM) 300 MG tablet TAKE 1 TABLET (300 MG TOTAL) BY MOUTH DAILY. 90 tablet 3  . atorvastatin (LIPITOR) 10 MG tablet Take 1 tablet (10 mg total) by mouth daily. 90 tablet 3  . chlorpheniramine (CHLOR-TRIMETON) 4 MG tablet Take 4 mg by mouth 2 (two) times daily as needed for allergies.    . famotidine (PEPCID) 20 MG  tablet One at bedtime 30 tablet 2  . fluticasone (FLONASE) 50 MCG/ACT nasal spray Place 1 spray into both nostrils 2 (two) times daily. For next 2-3 months 15.8 g 2  . losartan (COZAAR) 50 MG tablet Take 1 tablet (50 mg total) by mouth daily. 90 tablet 3  . metFORMIN (GLUCOPHAGE) 500 MG tablet Take 1 tablet (500 mg total) by mouth daily with breakfast. 90 tablet 1  . pantoprazole (PROTONIX) 40 MG tablet TAKE 1 TABLET (40 MG TOTAL) BY MOUTH DAILY. TAKE 30-60 MIN BEFORE FIRST MEAL OF THE DAY 30 tablet 0   No current facility-administered medications on file prior to visit.     Past Medical History:  Diagnosis Date  . Hyperlipidemia    NMR 05-2010:ldl 55(1415/711),HDL 48, TG 173. NO FH of MI.[place] study LDL goal  LDL goal =<160  . Hyperuricemia     Past Surgical History:  Procedure Laterality Date  . BONE MARROW BIOPSY  2006   for elevated WBC  . CHOLECYSTECTOMY  03/16/2012   INTRAOPERATIVE CHOLANGIOGRAM;  Surgeon: Earnstine Regal, MD;  Location: [facility];  Service: General;  Laterality: N/A;  . G3  P1  A2    . laparascopic cholecystectomy  03/2012   Dr Harlow Asa    Social History   Social History  . Marital status: Single    Spouse name: N/A  . Number of children: 1  . Years of education: N/A   Occupational History  .  [employer]  . [employer]  Social History Main Topics  . Smoking status: Never Smoker  . Smokeless tobacco: Never Used  . Alcohol use No  . Drug use: No  . Sexual activity: Not on file   Other Topics Concern  . Not on file   Social History Narrative  . No narrative on file    Family History  Problem Relation Age of Onset  . Stroke Paternal Grandmother 58  . Stroke Paternal Grandfather 47  . Hypertension Father   . Hypertension Mother   . Breast cancer Maternal Aunt   . Diabetes Neg Hx   . Heart disease Neg Hx     Review of Systems  Constitutional: Negative for fever.  Respiratory: Negative for cough, shortness of  breath and wheezing.   Cardiovascular: Negative for chest pain, palpitations and leg swelling.  Neurological: Positive for headaches (soemtimes). Negative for dizziness, light-headedness and numbness.       Objective:   Vitals:   09/02/16 1540  BP: (!) 146/82  Pulse: 67  Resp: 16  Temp: 98.1 F (36.7 C)   Filed Weights   09/02/16 1540  Weight: 150 lb (68 kg)   Body mass index is 27.44 kg/m.   Physical Exam    Constitutional: Appears well-developed and well-nourished. No distress.  HENT:  Head: Normocephalic and atraumatic.  Neck: Neck supple. No tracheal deviation present. No thyromegaly present.  No cervical lymphadenopathy Cardiovascular: Normal rate, regular rhythm and normal heart sounds.   No murmur heard. No carotid bruit .  No edema Pulmonary/Chest: Effort normal and breath sounds normal. No respiratory distress. No has no wheezes. No rales.  Skin: Skin is warm and dry. Not diaphoretic.  Psychiatric: Normal mood and affect. Behavior is normal.      Assessment & Plan:    See Problem List for Assessment and Plan of chronic medical problems.    F/u in 6 months

## 2016-09-02 NOTE — Assessment & Plan Note (Signed)
Cholesterol controlled - could be slightly better Continue lipitor - may considering increasing to 20 mg

## 2016-09-02 NOTE — Assessment & Plan Note (Signed)
She is taking her metformin once daily Compliant with a low sugar/carb diet and exercising a1c 6.4 %

## 2016-09-02 NOTE — Assessment & Plan Note (Signed)
Uric acid 6 Continue allopurinol at same dose

## 2016-09-02 NOTE — Assessment & Plan Note (Signed)
Well controlled at home BP well controlled Current regimen effective and well tolerated Continue current medications at current doses

## 2016-09-03 ENCOUNTER — Other Ambulatory Visit: Payer: Self-pay | Admitting: Internal Medicine

## 2016-09-03 DIAGNOSIS — R058 Other specified cough: Secondary | ICD-10-CM

## 2016-09-03 DIAGNOSIS — R05 Cough: Secondary | ICD-10-CM

## 2016-10-07 LAB — HM DIABETES EYE EXAM

## 2016-10-11 ENCOUNTER — Other Ambulatory Visit: Payer: Self-pay | Admitting: Internal Medicine

## 2016-10-11 DIAGNOSIS — E1165 Type 2 diabetes mellitus with hyperglycemia: Secondary | ICD-10-CM

## 2016-10-16 ENCOUNTER — Encounter: Payer: Self-pay | Admitting: Internal Medicine

## 2016-11-10 ENCOUNTER — Ambulatory Visit (INDEPENDENT_AMBULATORY_CARE_PROVIDER_SITE_OTHER): Payer: BLUE CROSS/BLUE SHIELD

## 2016-11-10 ENCOUNTER — Ambulatory Visit (INDEPENDENT_AMBULATORY_CARE_PROVIDER_SITE_OTHER): Payer: BLUE CROSS/BLUE SHIELD | Admitting: Urgent Care

## 2016-11-10 VITALS — BP 164/86 | HR 87 | Temp 98.3°F | Resp 18 | Ht 62.0 in | Wt 149.4 lb

## 2016-11-10 DIAGNOSIS — R0989 Other specified symptoms and signs involving the circulatory and respiratory systems: Secondary | ICD-10-CM

## 2016-11-10 DIAGNOSIS — R05 Cough: Secondary | ICD-10-CM

## 2016-11-10 DIAGNOSIS — J0110 Acute frontal sinusitis, unspecified: Secondary | ICD-10-CM

## 2016-11-10 DIAGNOSIS — I10 Essential (primary) hypertension: Secondary | ICD-10-CM | POA: Diagnosis not present

## 2016-11-10 DIAGNOSIS — R059 Cough, unspecified: Secondary | ICD-10-CM

## 2016-11-10 LAB — POCT CBC
Granulocyte percent: 66.6 %G (ref 37–80)
HCT, POC: 41.8 % (ref 37.7–47.9)
Hemoglobin: 14.8 g/dL (ref 12.2–16.2)
Lymph, poc: 4 — AB (ref 0.6–3.4)
MCH, POC: 30.4 pg (ref 27–31.2)
MCHC: 35.4 g/dL (ref 31.8–35.4)
MCV: 85.9 fL (ref 80–97)
MID (cbc): 1.4 — AB (ref 0–0.9)
MPV: 6.8 fL (ref 0–99.8)
POC Granulocyte: 10.7 — AB (ref 2–6.9)
POC LYMPH PERCENT: 24.8 %L (ref 10–50)
POC MID %: 8.6 %M (ref 0–12)
Platelet Count, POC: 321 10*3/uL (ref 142–424)
RBC: 4.87 M/uL (ref 4.04–5.48)
RDW, POC: 13.1 %
WBC: 16 10*3/uL — AB (ref 4.6–10.2)

## 2016-11-10 MED ORDER — HYDROCOD POLST-CPM POLST ER 10-8 MG/5ML PO SUER: 5.0000 mL | Freq: Every evening | ORAL | 0 refills | Status: DC | PRN

## 2016-11-10 MED ORDER — BENZONATATE 100 MG PO CAPS: 100.0000 mg | ORAL_CAPSULE | Freq: Three times a day (TID) | ORAL | 0 refills | Status: DC | PRN

## 2016-11-10 MED ORDER — AMOXICILLIN 875 MG PO TABS: 875.0000 mg | ORAL_TABLET | Freq: Two times a day (BID) | ORAL | 0 refills | Status: DC

## 2016-11-10 NOTE — Progress Notes (Signed)
MRN: [id number] DOB: [birth year]  Subjective:   Savannah Rogers is a 50 y.o. female presenting for chief complaint of Cough (chest/nasal congestion green mucus)  Cough - Reports 5 day history of productive cough with intermittent hemoptysis, chest congestion. Cough interrupts her sleep. Also has subjective fever, chills, sore gums, stuffy nose and ears, sore throat. Has tried NyQuil, DayQuil with minimal relief. Denies chest pain, shob, wheezing, n/v, abdominal pain, rashes. Denies smoking cigarettes. Denies history of asthma.  HTN - Has a history of HTN, is non-compliant with her losartan when she is ill. She generally runs 003'K systolic.  Savannah Rogers has a current medication list which includes the following prescription(s): allopurinol, atorvastatin, losartan, metformin, and pantoprazole. Also has No Known Allergies.  Savannah Rogers  has a past medical history of Hyperlipidemia and Hyperuricemia. Also  has a past surgical history that includes Bone marrow biopsy (2006); G3  P1  A2; laparascopic cholecystectomy (03/2012); and Cholecystectomy (03/16/2012).  Objective:   Vitals: BP (!) 164/86 (BP Location: Right Arm, Patient Position: Sitting, Cuff Size: Small)   Pulse 87   Temp 98.3 F (36.8 C) (Oral)   Resp 18   Ht '5\' 2"'  (1.575 m)   Wt 149 lb 6.4 oz (67.8 kg)   LMP 10/24/2016   SpO2 99%   BMI 27.33 kg/m   Physical Exam  Constitutional: She is oriented to person, place, and time. She appears well-developed and well-nourished.  HENT:  TM's intact bilaterally, no effusions or erythema. Nasal turbinates mildly erythematous, thick copious mucus present, nasal passages minimally patent. Frontal sinus tenderness. Oropharynx clear, mucous membranes moist, dentition in good repair.  Eyes: Right eye exhibits no discharge. Left eye exhibits no discharge.  Neck: Normal range of motion. Neck supple.  Cardiovascular: Normal rate, regular rhythm and intact distal pulses.  Exam reveals no gallop and no  friction rub.   No murmur heard. Pulmonary/Chest: No respiratory distress. She has no wheezes. She has no rales.  Lymphadenopathy:    She has no cervical adenopathy.  Neurological: She is alert and oriented to person, place, and time.  Skin: Skin is warm and dry.   Dg Chest 2 View  Result Date: 11/10/2016 CLINICAL DATA:  Cough and chest congestion for 1 week. EXAM: CHEST  2 VIEW COMPARISON:  04/10/2016 FINDINGS: The heart size and mediastinal contours are within normal limits. Both lungs are clear. The visualized skeletal structures are unremarkable. IMPRESSION: No active cardiopulmonary disease. Electronically Signed   By: Earle Gell M.D.   On: 11/10/2016 11:17   Results for orders placed or performed in visit on 11/10/16 (from the past 24 hour(s))  POCT CBC     Status: Abnormal   Collection Time: 11/10/16 11:18 AM  Result Value Ref Range   WBC 16.0 (A) 4.6 - 10.2 K/uL   Lymph, poc 4.0 (A) 0.6 - 3.4   POC LYMPH PERCENT 24.8 10 - 50 %L   MID (cbc) 1.4 (A) 0 - 0.9   POC MID % 8.6 0 - 12 %M   POC Granulocyte 10.7 (A) 2 - 6.9   Granulocyte percent 66.6 37 - 80 %G   RBC 4.87 4.04 - 5.48 M/uL   Hemoglobin 14.8 12.2 - 16.2 g/dL   HCT, POC 41.8 37.7 - 47.9 %   MCV 85.9 80 - 97 fL   MCH, POC 30.4 27 - 31.2 pg   MCHC 35.4 31.8 - 35.4 g/dL   RDW, POC 13.1 %   Platelet Count, POC 321 142 -  424 K/uL   MPV 6.8 0 - 99.8 fL   Assessment and Plan :   1. Acute non-recurrent frontal sinusitis 2. Cough 3. Chest congestion - Start amoxicillin to address sinusitis given frontal sinus tenderness, 16 wbc. Use supportive care otherwise, rtc in 1 week if no improvement.  4. Essential hypertension, benign - Emphasized compliance. RTC if BP remains elevated >012 systolic.  Jaynee Eagles, PA-C [facility] [phone] 11/10/2016 10:47 AM

## 2016-11-10 NOTE — Patient Instructions (Addendum)
Sinusitis, Adult Sinusitis is soreness and inflammation of your sinuses. Sinuses are hollow spaces in the bones around your face. Your sinuses are located:  Around your eyes.  In the middle of your forehead.  Behind your nose.  In your cheekbones. Your sinuses and nasal passages are lined with a stringy fluid (mucus). Mucus normally drains out of your sinuses. When your nasal tissues become inflamed or swollen, the mucus can become trapped or blocked so air cannot flow through your sinuses. This allows bacteria, viruses, and funguses to grow, which leads to infection. Sinusitis can develop quickly and last for 7?10 days (acute) or for more than 12 weeks (chronic). Sinusitis often develops after a cold. What are the causes? This condition is caused by anything that creates swelling in the sinuses or stops mucus from draining, including:  Allergies.  Asthma.  Bacterial or viral infection.  Abnormally shaped bones between the nasal passages.  Nasal growths that contain mucus (nasal polyps).  Narrow sinus openings.  Pollutants, such as chemicals or irritants in the air.  A foreign object stuck in the nose.  A fungal infection. This is rare. What increases the risk? The following factors may make you more likely to develop this condition:  Having allergies or asthma.  Having had a recent cold or respiratory tract infection.  Having structural deformities or blockages in your nose or sinuses.  Having a weak immune system.  Doing a lot of swimming or diving.  Overusing nasal sprays.  Smoking. What are the signs or symptoms? The main symptoms of this condition are pain and a feeling of pressure around the affected sinuses. Other symptoms include:  Upper toothache.  Earache.  Headache.  Bad breath.  Decreased sense of smell and taste.  A cough that may get worse at night.  Fatigue.  Fever.  Thick drainage from your nose. The drainage is often green and it may  contain pus (purulent).  Stuffy nose or congestion.  Postnasal drip. This is when extra mucus collects in the throat or back of the nose.  Swelling and warmth over the affected sinuses.  Sore throat.  Sensitivity to light. How is this diagnosed? This condition is diagnosed based on symptoms, a medical history, and a physical exam. To find out if your condition is acute or chronic, your health care provider may:  Look in your nose for signs of nasal polyps.  Tap over the affected sinus to check for signs of infection.  View the inside of your sinuses using an imaging device that has a light attached (endoscope). If your health care provider suspects that you have chronic sinusitis, you may also:  Be tested for allergies.  Have a sample of mucus taken from your nose (nasal culture) and checked for bacteria.  Have a mucus sample examined to see if your sinusitis is related to an allergy. If your sinusitis does not respond to treatment and it lasts longer than 8 weeks, you may have an MRI or CT scan to check your sinuses. These scans also help to determine how severe your infection is. In rare cases, a bone biopsy may be done to rule out more serious types of fungal sinus disease. How is this treated? Treatment for sinusitis depends on the cause and whether your condition is chronic or acute. If a virus is causing your sinusitis, your symptoms will go away on their own within 10 days. You may be given medicines to relieve your symptoms, including:  Topical nasal decongestants. They   shrink swollen nasal passages and let mucus drain from your sinuses.  Antihistamines. These drugs block inflammation that is triggered by allergies. This can help to ease swelling in your nose and sinuses.  Topical nasal corticosteroids. These are nasal sprays that ease inflammation and swelling in your nose and sinuses.  Nasal saline washes. These rinses can help to get rid of thick mucus in your  nose. If your condition is caused by bacteria, you will be given an antibiotic medicine. If your condition is caused by a fungus, you will be given an antifungal medicine. Surgery may be needed to correct underlying conditions, such as narrow nasal passages. Surgery may also be needed to remove polyps. Follow these instructions at home: Medicines  Take, use, or apply over-the-counter and prescription medicines only as told by your health care provider. These may include nasal sprays.  If you were prescribed an antibiotic medicine, take it as told by your health care provider. Do not stop taking the antibiotic even if you start to feel better. Hydrate and Humidify  Drink enough water to keep your urine clear or pale yellow. Staying hydrated will help to thin your mucus.  Use a cool mist humidifier to keep the humidity level in your home above 50%.  Inhale steam for 10-15 minutes, 3-4 times a day or as told by your health care provider. You can do this in the bathroom while a hot shower is running.  Limit your exposure to cool or dry air. Rest  Rest as much as possible.  Sleep with your head raised (elevated).  Make sure to get enough sleep each night. General instructions  Apply a warm, moist washcloth to your face 3-4 times a day or as told by your health care provider. This will help with discomfort.  Wash your hands often with soap and water to reduce your exposure to viruses and other germs. If soap and water are not available, use hand sanitizer.  Do not smoke. Avoid being around people who are smoking (secondhand smoke).  Keep all follow-up visits as told by your health care provider. This is important. Contact a health care provider if:  You have a fever.  Your symptoms get worse.  Your symptoms do not improve within 10 days. Get help right away if:  You have a severe headache.  You have persistent vomiting.  You have pain or swelling around your face or  eyes.  You have vision problems.  You develop confusion.  Your neck is stiff.  You have trouble breathing. This information is not intended to replace advice given to you by your health care provider. Make sure you discuss any questions you have with your health care provider. Document Released: 11/17/2005 Document Revised: 07/13/2016 Document Reviewed: 09/12/2015 Elsevier Interactive Patient Education  2017 Elsevier Inc.     IF you received an x-ray today, you will receive an invoice from [facility]. Please contact [facility] at [phone] with questions or concerns regarding your invoice.   IF you received labwork today, you will receive an invoice from [facility]. Please contact [facility] at [phone] with questions or concerns regarding your invoice.   Our billing staff will not be able to assist you with questions regarding bills from these companies.  You will be contacted with the lab results as soon as they are available. The fastest way to get your results is to activate your My Chart account. Instructions are located on the last page of this paperwork. If   you have not heard from us regarding the results in 2 weeks, please contact this office.      

## 2017-02-08 ENCOUNTER — Other Ambulatory Visit: Payer: Self-pay | Admitting: Internal Medicine

## 2017-02-11 ENCOUNTER — Other Ambulatory Visit: Payer: Self-pay | Admitting: *Deleted

## 2017-02-11 MED ORDER — LOSARTAN POTASSIUM 50 MG PO TABS: 50.0000 mg | ORAL_TABLET | Freq: Every day | ORAL | 2 refills | Status: DC

## 2017-02-11 MED ORDER — ALLOPURINOL 300 MG PO TABS: ORAL_TABLET | ORAL | 2 refills | Status: DC

## 2017-02-11 NOTE — Addendum Note (Signed)
Addended by: Deatra JamesBRAND, LUCY M on: 02/11/2017 10:48 AM   Modules accepted: Orders

## 2017-02-23 ENCOUNTER — Encounter (HOSPITAL_BASED_OUTPATIENT_CLINIC_OR_DEPARTMENT_OTHER): Payer: Self-pay | Admitting: Emergency Medicine

## 2017-02-23 ENCOUNTER — Emergency Department (HOSPITAL_BASED_OUTPATIENT_CLINIC_OR_DEPARTMENT_OTHER): Payer: BLUE CROSS/BLUE SHIELD

## 2017-02-23 ENCOUNTER — Emergency Department (HOSPITAL_BASED_OUTPATIENT_CLINIC_OR_DEPARTMENT_OTHER)
Admission: EM | Admit: 2017-02-23 | Discharge: 2017-02-23 | Disposition: A | Discharge status: Home / Self Care | Payer: BLUE CROSS/BLUE SHIELD | Attending: Emergency Medicine | Admitting: Emergency Medicine

## 2017-02-23 DIAGNOSIS — Z7984 Long term (current) use of oral hypoglycemic drugs: Secondary | ICD-10-CM | POA: Insufficient documentation

## 2017-02-23 DIAGNOSIS — I10 Essential (primary) hypertension: Secondary | ICD-10-CM | POA: Insufficient documentation

## 2017-02-23 DIAGNOSIS — K859 Acute pancreatitis without necrosis or infection, unspecified: Secondary | ICD-10-CM

## 2017-02-23 DIAGNOSIS — E119 Type 2 diabetes mellitus without complications: Secondary | ICD-10-CM | POA: Insufficient documentation

## 2017-02-23 DIAGNOSIS — Z79899 Other long term (current) drug therapy: Secondary | ICD-10-CM | POA: Insufficient documentation

## 2017-02-23 DIAGNOSIS — R1110 Vomiting, unspecified: Secondary | ICD-10-CM | POA: Diagnosis not present

## 2017-02-23 DIAGNOSIS — R109 Unspecified abdominal pain: Secondary | ICD-10-CM | POA: Diagnosis not present

## 2017-02-23 DIAGNOSIS — R1013 Epigastric pain: Secondary | ICD-10-CM | POA: Diagnosis not present

## 2017-02-23 HISTORY — DX: Essential (primary) hypertension: I10

## 2017-02-23 HISTORY — DX: Type 2 diabetes mellitus without complications: E11.9

## 2017-02-23 LAB — COMPREHENSIVE METABOLIC PANEL
ALT: 65 U/L — ABNORMAL HIGH (ref 14–54)
AST: 111 U/L — ABNORMAL HIGH (ref 15–41)
Albumin: 3.4 g/dL — ABNORMAL LOW (ref 3.5–5.0)
Alkaline Phosphatase: 109 U/L (ref 38–126)
Anion gap: 7 (ref 5–15)
BUN: 9 mg/dL (ref 6–20)
CO2: 25 mmol/L (ref 22–32)
Calcium: 8.4 mg/dL — ABNORMAL LOW (ref 8.9–10.3)
Chloride: 104 mmol/L (ref 101–111)
Creatinine, Ser: 0.54 mg/dL (ref 0.44–1.00)
GFR calc Af Amer: >60 mL/min (ref >60)
GFR calc non Af Amer: >60 mL/min (ref >60)
Glucose, Bld: 134 mg/dL — ABNORMAL HIGH (ref 65–99)
Potassium: 3.8 mmol/L (ref 3.5–5.1)
Sodium: 136 mmol/L (ref 135–145)
Total Bilirubin: 0.5 mg/dL (ref 0.3–1.2)
Total Protein: 6.4 g/dL — ABNORMAL LOW (ref 6.5–8.1)

## 2017-02-23 LAB — CBC WITH DIFFERENTIAL/PLATELET
Basophils Absolute: 0 10*3/uL (ref 0.0–0.1)
Basophils Relative: 0 %
Eosinophils Absolute: 0.1 10*3/uL (ref 0.0–0.7)
Eosinophils Relative: 1 %
HCT: 40.1 % (ref 36.0–46.0)
Hemoglobin: 14.1 g/dL (ref 12.0–15.0)
Lymphocytes Relative: 10 %
Lymphs Abs: 1.8 10*3/uL (ref 0.7–4.0)
MCH: 29.4 pg (ref 26.0–34.0)
MCHC: 35.2 g/dL (ref 30.0–36.0)
MCV: 83.5 fL (ref 78.0–100.0)
Monocytes Absolute: 1 10*3/uL (ref 0.1–1.0)
Monocytes Relative: 6 %
Neutro Abs: 14.2 10*3/uL — ABNORMAL HIGH (ref 1.7–7.7)
Neutrophils Relative %: 83 %
Platelets: 329 10*3/uL (ref 150–400)
RBC: 4.8 MIL/uL (ref 3.87–5.11)
RDW: 13.2 % (ref 11.5–15.5)
WBC: 17.1 10*3/uL — ABNORMAL HIGH (ref 4.0–10.5)

## 2017-02-23 LAB — LIPASE, BLOOD: Lipase: 843 U/L — ABNORMAL HIGH (ref 11–51)

## 2017-02-23 MED ORDER — FENTANYL CITRATE (PF) 100 MCG/2ML IJ SOLN
50.0000 ug | Freq: Once | INTRAMUSCULAR | Status: AC
  Administered 2017-02-23: 50 ug via INTRAVENOUS
  Filled 2017-02-23: qty 2

## 2017-02-23 MED ORDER — IOPAMIDOL (ISOVUE-300) INJECTION 61%
100.0000 mL | Freq: Once | INTRAVENOUS | Status: AC | PRN
  Administered 2017-02-23: 100 mL via INTRAVENOUS

## 2017-02-23 MED ORDER — ONDANSETRON HCL 4 MG/2ML IJ SOLN: 4.0000 mg | Freq: Once | INTRAMUSCULAR | Status: DC

## 2017-02-23 MED ORDER — ONDANSETRON 4 MG PO TBDP: 4.0000 mg | ORAL_TABLET | Freq: Three times a day (TID) | ORAL | 0 refills | Status: DC | PRN

## 2017-02-23 MED ORDER — OXYCODONE-ACETAMINOPHEN 5-325 MG PO TABS: 1.0000 | ORAL_TABLET | Freq: Four times a day (QID) | ORAL | 0 refills | Status: DC | PRN

## 2017-02-23 NOTE — ED Triage Notes (Signed)
Presents via EMS, reports sharp sudden epigastric pain at 3:30 this afternoon with SOB and vomiting x 1. Pt had 4 mg zofran with some relief per EMS.

## 2017-02-23 NOTE — ED Notes (Signed)
ED Provider at bedside. 

## 2017-02-23 NOTE — ED Provider Notes (Signed)
[facility] DEPT [facility] Provider Note   CSN: [id number] Arrival date & time: 02/23/17  1730   By signing my name below, I, Neta Mends, attest that this documentation has been prepared under the direction and in the presence of Davonna Belling, MD . Electronically Signed: Neta Mends, ED Scribe. 02/23/2017. 5:50 PM.   History   Chief Complaint Chief Complaint  Patient presents with  . Abdominal Pain    The history is provided by the patient. No language interpreter was used.   HPI Comments:  Savannah Rogers is a 51 y.o. female who presents to the Emergency Department complaining of constant abdominal pain that began 2 hours ago. Pt has not eaten since onset. She describes the pain as sharp. Pt complains of associated nausea, vomiting x 1, SOB. Denies hx of stomach ulcers. Pt was given Zofran by EMS with moderate relief. Pt denies diarrhea, constipation.   Past Medical History:  Diagnosis Date  . Diabetes mellitus without complication ([place])   . Hyperlipidemia    NMR 05-2010:ldl 55(1415/711),HDL 48, TG 173. NO FH of MI.[place] study LDL goal  LDL goal =<160  . Hypertension   . Hyperuricemia     Patient Active Problem List   Diagnosis Date Noted  . Abnormal CXR 04/19/2016  . Upper airway cough syndrome likely related to allergic rhinitis  04/10/2016  . Essential hypertension, benign 03/05/2016  . Non-allergic rhinitis 06/13/2015  . Vitamin D deficiency 05/22/2015  . Diabetes mellitus type 2, uncontrolled ([place]) 12/04/2014  . Hyperuricemia 03/24/2013  . Cholelithiasis with cholecystitis 04/13/2012  . Mixed hyperlipidemia 03/06/2008    Past Surgical History:  Procedure Laterality Date  . BONE MARROW BIOPSY  2006   for elevated WBC  . CHOLECYSTECTOMY  03/16/2012   INTRAOPERATIVE CHOLANGIOGRAM;  Surgeon: Earnstine Regal, MD;  Location: [facility] ORS;  Service: General;  Laterality: N/A;  . G3  P1  A2    . laparascopic cholecystectomy  03/2012   Dr Harlow Asa    OB  History    No data available       Home Medications    Prior to Admission medications   Medication Sig Start Date End Date Taking? Authorizing Provider  allopurinol (ZYLOPRIM) 300 MG tablet TAKE 1 TABLET (300 MG TOTAL) BY MOUTH DAILY. 02/11/17  Yes Binnie Rail, MD  atorvastatin (LIPITOR) 10 MG tablet TAKE 1 TABLET BY MOUTH DAILY 02/09/17  Yes Binnie Rail, MD  lisinopril (PRINIVIL,ZESTRIL) 40 MG tablet Take 40 mg by mouth daily.   Yes Historical Provider, MD  losartan (COZAAR) 50 MG tablet Take 1 tablet (50 mg total) by mouth daily. 02/11/17  Yes Binnie Rail, MD  metFORMIN (GLUCOPHAGE) 500 MG tablet Take 1 tablet (500 mg total) by mouth daily with breakfast. 03/05/16  Yes Binnie Rail, MD  amoxicillin (AMOXIL) 875 MG tablet Take 1 tablet (875 mg total) by mouth 2 (two) times daily. 11/10/16   Jaynee Eagles, PA-C  benzonatate (TESSALON) 100 MG capsule Take 1-2 capsules (100-200 mg total) by mouth 3 (three) times daily as needed for cough. 11/10/16   Jaynee Eagles, PA-C  chlorpheniramine-HYDROcodone [facility]) 10-8 MG/5ML SUER Take 5 mLs by mouth at bedtime as needed for cough. 11/10/16   Jaynee Eagles, PA-C  ondansetron (ZOFRAN-ODT) 4 MG disintegrating tablet Take 1 tablet (4 mg total) by mouth every 8 (eight) hours as needed for nausea or vomiting. 02/23/17   Davonna Belling, MD  oxyCODONE-acetaminophen (PERCOCET/ROXICET) 5-325 MG tablet Take 1-2 tablets by  mouth every 6 (six) hours as needed for severe pain. 02/23/17   Davonna Belling, MD  pantoprazole (PROTONIX) 40 MG tablet Take 40 mg by mouth daily as needed.    Historical Provider, MD    Family History Family History  Problem Relation Age of Onset  . Stroke Paternal Grandmother 67  . Stroke Paternal Grandfather 4  . Hypertension Father   . Hypertension Mother   . Breast cancer Maternal Aunt   . Diabetes Neg Hx   . Heart disease Neg Hx     Social History Social History  Substance Use Topics  . Smoking status: Never  Smoker  . Smokeless tobacco: Never Used  . Alcohol use No     Allergies   Patient has no known allergies.   Review of Systems Review of Systems  Respiratory: Positive for shortness of breath.   Gastrointestinal: Positive for abdominal pain, nausea and vomiting. Negative for constipation and diarrhea.  All other systems reviewed and are negative.    Physical Exam Updated Vital Signs BP 112/68 (BP Location: Right Arm)   Pulse 74   Temp 98.2 F (36.8 C) (Oral)   Resp 20   Ht _0  (1.575 m)   Wt 144 lb (65.3 kg)   SpO2 99%   BMI 26.34 kg/m   Physical Exam  Constitutional: She appears well-developed and well-nourished. No distress.  HENT:  Head: Normocephalic and atraumatic.  Eyes: Conjunctivae are normal.  Cardiovascular: Normal rate and regular rhythm.   No murmur heard. Pulmonary/Chest: Effort normal and breath sounds normal. She has no wheezes. She has no rales.  Abdominal: She exhibits no distension and no mass.  Moderate epigastric tenderness. No bruising. No CVA tenderness.   Musculoskeletal: She exhibits no edema.  Neurological: She is alert.  Skin: Skin is warm and dry.  Psychiatric: She has a normal mood and affect.  Nursing note and vitals reviewed.    ED Treatments / Results  Labs (all labs ordered are listed, but only abnormal results are displayed) Labs Reviewed  COMPREHENSIVE METABOLIC PANEL - Abnormal; Notable for the following:       Result Value   Glucose, Bld 134 (*)    Calcium 8.4 (*)    Total Protein 6.4 (*)    Albumin 3.4 (*)    AST 111 (*)    ALT 65 (*)    All other components within normal limits  LIPASE, BLOOD - Abnormal; Notable for the following:    Lipase 843 (*)    All other components within normal limits  CBC WITH DIFFERENTIAL/PLATELET - Abnormal; Notable for the following:    WBC 17.1 (*)    Neutro Abs 14.2 (*)    All other components within normal limits    EKG  EKG Interpretation None       Radiology Ct  Abdomen Pelvis W Contrast  Result Date: 02/23/2017 CLINICAL DATA:  Acute onset of epigastric abdominal pain, shortness of breath and vomiting. Initial encounter. EXAM: CT ABDOMEN AND PELVIS WITH CONTRAST TECHNIQUE: Multidetector CT imaging of the abdomen and pelvis was performed using the standard protocol following bolus administration of intravenous contrast. CONTRAST:  154m ISOVUE-300 IOPAMIDOL (ISOVUE-300) INJECTION 61% COMPARISON:  CT of the abdomen and pelvis from 03/15/2012, and abdominal radiograph performed earlier today at at 6:09 p.m. FINDINGS: Lower chest: Minimal bibasilar atelectasis is noted. The visualized portions of the mediastinum are unremarkable. Hepatobiliary: The liver is unremarkable in appearance. The patient is status post cholecystectomy, with clips noted at the  gallbladder fossa. The common bile duct remains normal in caliber. Pancreas: The pancreas is within normal limits. Spleen: The spleen is unremarkable in appearance. Adrenals/Urinary Tract: The adrenal glands are unremarkable in appearance. The kidneys are within normal limits. There is no evidence of hydronephrosis. No renal or ureteral stones are identified. No perinephric stranding is seen. Stomach/Bowel: The stomach is unremarkable in appearance. The appendix remains normal in caliber and contains trace contrast. A small amount of fluid tracking about the appendix is thought to reflect fluid tracking from the pelvis. There is no definite evidence for appendicitis. The colon is unremarkable in appearance. No significant small bowel abnormalities are seen. Vascular/Lymphatic: The abdominal aorta is unremarkable in appearance. The inferior vena cava is grossly unremarkable. No retroperitoneal lymphadenopathy is seen. No pelvic sidewall lymphadenopathy is identified. Reproductive: The bladder is mildly distended and grossly unremarkable. The uterus is grossly unremarkable. A Gellhorn pessary is noted in expected position. The  ovaries are grossly unremarkable. A small amount of free fluid within the pelvis, tracking to the left, likely remains within physiologic limits, though a recently ruptured cyst could have a similar appearance. Other: A tiny periumbilical hernia is seen, containing only fat. Musculoskeletal: No acute osseous abnormalities are identified. The visualized musculature is unremarkable in appearance. IMPRESSION: 1. No acute abnormality seen to explain the patient's symptoms. 2. Small amount of free fluid within the pelvis, tracking to the left, likely remains within physiologic limits, though a recently ruptured cyst could have a similar appearance. 3. Tiny periumbilical hernia, containing only fat. Electronically Signed   By: Garald Balding M.D.   On: 02/23/2017 21:29   Dg Abdomen Acute W/chest  Result Date: 02/23/2017 CLINICAL DATA:  Epigastric pain with nausea, dyspnea and vomiting EXAM: DG ABDOMEN ACUTE W/ 1V CHEST COMPARISON:  CXR 11/10/2016 FINDINGS: No bowel obstruction. A few mildly dilated small bowel segments are seen in the left hemiabdomen possibly related to a mild enteritis. Bowel gas is noted to the level of the proximal sigmoid and rectum. Disc shaped device consistent with a contraceptive diaphragm noted in the lower pelvis. Heart size and mediastinal contours are within normal limits. Both lungs are clear. IMPRESSION: Mild gaseous distention of small bowel segments in the left hemiabdomen without obstruction. Findings may represent a mild enteritis. Electronically Signed   By: Ashley Royalty M.D.   On: 02/23/2017 18:17    Procedures Procedures (including critical care time)  Medications Ordered in ED Medications  fentaNYL (SUBLIMAZE) injection 50 mcg (50 mcg Intravenous Given 02/23/17 2011)  iopamidol (ISOVUE-300) 61 % injection 100 mL (100 mLs Intravenous Contrast Given 02/23/17 2104)     Initial Impression / Assessment and Plan / ED Course  I have reviewed the triage vital signs and the  nursing notes.  Pertinent labs & imaging results that were available during my care of the patient were reviewed by me and considered in my medical decision making (see chart for details).   patient with abdominal pain some nausea and vomiting. Has elevated lipase indicating pancreatitis. No history of pancreatitis in the past. Previous cholecystectomy with apparent intraoperative cholangiogram. Vitals reassuring. Lab work overall reassuring. Feels better after fluids. Has tolerated orals. Will discharge home with antibiotics and pain meds. Will follow with primary care and her gastroenterologist, Dr. Collene Mares.  Final Clinical Impressions(s) / ED Diagnoses   Final diagnoses:  Acute pancreatitis, unspecified complication status, unspecified pancreatitis type    New Prescriptions Discharge Medication List as of 02/23/2017 10:30 PM    START taking  these medications   Details  ondansetron (ZOFRAN-ODT) 4 MG disintegrating tablet Take 1 tablet (4 mg total) by mouth every 8 (eight) hours as needed for nausea or vomiting., Starting Mon 02/23/2017, Print    oxyCODONE-acetaminophen (PERCOCET/ROXICET) 5-325 MG tablet Take 1-2 tablets by mouth every 6 (six) hours as needed for severe pain., Starting Mon 02/23/2017, Print      I personally performed the services described in this documentation, which was scribed in my presence. The recorded information has been reviewed and is accurate.       Davonna Belling, MD 02/23/17 [phone]

## 2017-02-27 ENCOUNTER — Other Ambulatory Visit: Payer: Self-pay | Admitting: Internal Medicine

## 2017-03-02 ENCOUNTER — Other Ambulatory Visit (INDEPENDENT_AMBULATORY_CARE_PROVIDER_SITE_OTHER): Payer: BLUE CROSS/BLUE SHIELD

## 2017-03-02 DIAGNOSIS — I10 Essential (primary) hypertension: Secondary | ICD-10-CM

## 2017-03-02 DIAGNOSIS — E1165 Type 2 diabetes mellitus with hyperglycemia: Secondary | ICD-10-CM | POA: Diagnosis not present

## 2017-03-02 DIAGNOSIS — E790 Hyperuricemia without signs of inflammatory arthritis and tophaceous disease: Secondary | ICD-10-CM | POA: Diagnosis not present

## 2017-03-02 DIAGNOSIS — E782 Mixed hyperlipidemia: Secondary | ICD-10-CM | POA: Diagnosis not present

## 2017-03-02 LAB — CBC WITH DIFFERENTIAL/PLATELET
Basophils Absolute: 0.1 10*3/uL (ref 0.0–0.1)
Basophils Relative: 0.8 % (ref 0.0–3.0)
Eosinophils Absolute: 0.5 10*3/uL (ref 0.0–0.7)
Eosinophils Relative: 3.3 % (ref 0.0–5.0)
HCT: 43.3 % (ref 36.0–46.0)
Hemoglobin: 14.5 g/dL (ref 12.0–15.0)
Lymphocytes Relative: 25.1 % (ref 12.0–46.0)
Lymphs Abs: 3.5 10*3/uL (ref 0.7–4.0)
MCHC: 33.5 g/dL (ref 30.0–36.0)
MCV: 86.4 fl (ref 78.0–100.0)
Monocytes Absolute: 0.8 10*3/uL (ref 0.1–1.0)
Monocytes Relative: 5.4 % (ref 3.0–12.0)
Neutro Abs: 9.1 10*3/uL — ABNORMAL HIGH (ref 1.4–7.7)
Neutrophils Relative %: 65.4 % (ref 43.0–77.0)
Platelets: 446 10*3/uL — ABNORMAL HIGH (ref 150.0–400.0)
RBC: 5.01 Mil/uL (ref 3.87–5.11)
RDW: 13.3 % (ref 11.5–15.5)
WBC: 14 10*3/uL — ABNORMAL HIGH (ref 4.0–10.5)

## 2017-03-02 LAB — LIPID PANEL
Cholesterol: 181 mg/dL (ref 0–200)
HDL: 42.4 mg/dL (ref >39.00)
NonHDL: 139.06
Total CHOL/HDL Ratio: 4
Triglycerides: 224 mg/dL — ABNORMAL HIGH (ref 0.0–149.0)
VLDL: 44.8 mg/dL — ABNORMAL HIGH (ref 0.0–40.0)

## 2017-03-02 LAB — COMPREHENSIVE METABOLIC PANEL
ALT: 54 U/L — ABNORMAL HIGH (ref 0–35)
AST: 25 U/L (ref 0–37)
Albumin: 4.1 g/dL (ref 3.5–5.2)
Alkaline Phosphatase: 95 U/L (ref 39–117)
BUN: 10 mg/dL (ref 6–23)
CO2: 28 mEq/L (ref 19–32)
Calcium: 9.6 mg/dL (ref 8.4–10.5)
Chloride: 102 mEq/L (ref 96–112)
Creatinine, Ser: 0.74 mg/dL (ref 0.40–1.20)
GFR: 87.54 mL/min (ref >60.00)
Glucose, Bld: 124 mg/dL — ABNORMAL HIGH (ref 70–99)
Potassium: 4.5 mEq/L (ref 3.5–5.1)
Sodium: 138 mEq/L (ref 135–145)
Total Bilirubin: 0.4 mg/dL (ref 0.2–1.2)
Total Protein: 7.3 g/dL (ref 6.0–8.3)

## 2017-03-02 LAB — URIC ACID: Uric Acid, Serum: 4.1 mg/dL (ref 2.4–7.0)

## 2017-03-02 LAB — HIV ANTIBODY (ROUTINE TESTING W REFLEX): HIV 1&2 Ab, 4th Generation: NONREACTIVE

## 2017-03-02 LAB — LDL CHOLESTEROL, DIRECT: Direct LDL: 104 mg/dL

## 2017-03-02 LAB — TSH: TSH: 4.59 u[IU]/mL — ABNORMAL HIGH (ref 0.35–4.50)

## 2017-03-02 LAB — HEMOGLOBIN A1C: Hgb A1c MFr Bld: 6.9 % — ABNORMAL HIGH (ref 4.6–6.5)

## 2017-03-02 NOTE — Progress Notes (Signed)
Subjective:    Patient ID: Savannah Rogers, female    DOB: 21-Jun-1966, 51 y.o.   MRN: [id number]  HPI The patient is here for follow up.  Pancreatitis:  She went to the ED 3/26 for abdominal pain that was very sharp for 15-20 minutes and then became a dull pain.  She had abdominal pain for 2 hours, nausea, vomiting x 1 and SOB.  When she left the ED she had minimal pain.  She was diagnosed with pancreatitis.  She was sent home with zofran and pain medication.  At times she feels nausea with eating, but denies pain.  She has a follow with GI this week.  She does not drink alcohol.  She was taking something from [place] for sugar control and she thinks that may have caused it - she took it for three weeks.   Hypertension: She is taking her medication daily. She is compliant with a low sodium diet.  She denies chest pain, palpitations, edema, shortness of breath and regular headaches. She is exercising regularly.      Diabetes: She is taking her medication daily as prescribed. She is compliant with a diabetic diet. She is exercising regularly. She monitors her sugars and they have been running < 120.    Hyperuricemia:  She is taking allopurinol daily.   She denies gout flares.   Elevated tsh:  Her energy level is good.  She denies any changes in weight that does not make sense.    Medications and allergies reviewed with patient and updated if appropriate.  Patient Active Problem List   Diagnosis Date Noted  . Abnormal CXR 04/19/2016  . Upper airway cough syndrome likely related to allergic rhinitis  04/10/2016  . Essential hypertension, benign 03/05/2016  . Non-allergic rhinitis 06/13/2015  . Vitamin D deficiency 05/22/2015  . Diabetes ([place]) 12/04/2014  . Hyperuricemia 03/24/2013  . Mixed hyperlipidemia 03/06/2008    Current Outpatient Prescriptions on File Prior to Visit  Medication Sig Dispense Refill  . allopurinol (ZYLOPRIM) 300 MG tablet TAKE 1 TABLET (300 MG TOTAL) BY MOUTH  DAILY. 90 tablet 2  . atorvastatin (LIPITOR) 10 MG tablet TAKE 1 TABLET BY MOUTH DAILY 90 tablet 0  . lisinopril (PRINIVIL,ZESTRIL) 40 MG tablet Take 40 mg by mouth daily.    Marland Kitchen losartan (COZAAR) 50 MG tablet Take 1 tablet (50 mg total) by mouth daily. 90 tablet 2  . metFORMIN (GLUCOPHAGE) 500 MG tablet Take 1 tablet (500 mg total) by mouth daily with breakfast. 90 tablet 1  . pantoprazole (PROTONIX) 40 MG tablet Take 40 mg by mouth daily as needed.     No current facility-administered medications on file prior to visit.     Past Medical History:  Diagnosis Date  . Diabetes mellitus without complication ([place])   . Hyperlipidemia    NMR 05-2010:ldl 55(1415/711),HDL 48, TG 173. NO FH of MI.[place] study LDL goal  LDL goal =<160  . Hypertension   . Hyperuricemia     Past Surgical History:  Procedure Laterality Date  . BONE MARROW BIOPSY  2006   for elevated WBC  . CHOLECYSTECTOMY  03/16/2012   INTRAOPERATIVE CHOLANGIOGRAM;  Surgeon: Earnstine Regal, MD;  Location: [facility];  Service: General;  Laterality: N/A;  . G3  P1  A2    . laparascopic cholecystectomy  03/2012   Dr Harlow Asa    Social History   Social History  . Marital status: Single    Spouse name: N/A  . Number  of children: 1  . Years of education: N/A   Occupational History  .  [employer]  . [employer]   Social History Main Topics  . Smoking status: Never Smoker  . Smokeless tobacco: Never Used  . Alcohol use No  . Drug use: No  . Sexual activity: Not Asked   Other Topics Concern  . None   Social History Narrative  . None    Family History  Problem Relation Age of Onset  . Stroke Paternal Grandmother 87  . Stroke Paternal Grandfather 58  . Hypertension Father   . Hypertension Mother   . Breast cancer Maternal Aunt   . Diabetes Neg Hx   . Heart disease Neg Hx     Review of Systems  Constitutional: Positive for fatigue (since ED). Negative for chills and fever.  Respiratory:  Positive for cough (allergies). Negative for shortness of breath and wheezing.   Cardiovascular: Negative for chest pain and leg swelling.  Gastrointestinal: Positive for nausea (with eating sometimes). Negative for abdominal pain, constipation and diarrhea.  Neurological: Negative for light-headedness and headaches.       Objective:   Vitals:   03/03/17 1546  BP: 124/86  Pulse: 80  Resp: 16  Temp: 98 F (36.7 C)   Wt Readings from Last 3 Encounters:  03/03/17 147 lb (66.7 kg)  02/23/17 144 lb (65.3 kg)  11/10/16 149 lb 6.4 oz (67.8 kg)   Body mass index is 26.89 kg/m.   Physical Exam    Constitutional: Appears well-developed and well-nourished. No distress.  HENT:  Head: Normocephalic and atraumatic.  Neck: Neck supple. No tracheal deviation present. No thyromegaly present.  No cervical lymphadenopathy Cardiovascular: Normal rate, regular rhythm and normal heart sounds.   No murmur heard. No carotid bruit .  No edema Pulmonary/Chest: Effort normal and breath sounds normal. No respiratory distress. No has no wheezes. No rales.  Abdomen: soft, non tender, non distended, no HSM Skin: Skin is warm and dry. Not diaphoretic.  Psychiatric: Normal mood and affect. Behavior is normal.    ABDOMEN XRAY WITH CHEST, 02/23/17:  IMPRESSION: Mild gaseous distention of small bowel segments in the left hemiabdomen without obstruction. Findings may represent a mild Enteritis  CT ABDOMEN AND PELVIS WITH CONTRAST, 02/23/17: IMPRESSION: 1. No acute abnormality seen to explain the patient's symptoms. 2. Small amount of free fluid within the pelvis, tracking to the left, likely remains within physiologic limits, though a recently ruptured cyst could have a similar appearance. 3. Tiny periumbilical hernia, containing only fat.  Assessment & Plan:    See Problem List for Assessment and Plan of chronic medical problems.

## 2017-03-03 ENCOUNTER — Ambulatory Visit (INDEPENDENT_AMBULATORY_CARE_PROVIDER_SITE_OTHER): Payer: BLUE CROSS/BLUE SHIELD | Admitting: Internal Medicine

## 2017-03-03 ENCOUNTER — Encounter: Payer: Self-pay | Admitting: Internal Medicine

## 2017-03-03 VITALS — BP 124/86 | HR 80 | Temp 98.0°F | Resp 16 | Ht 62.0 in | Wt 147.0 lb

## 2017-03-03 DIAGNOSIS — I10 Essential (primary) hypertension: Secondary | ICD-10-CM | POA: Diagnosis not present

## 2017-03-03 DIAGNOSIS — K859 Acute pancreatitis without necrosis or infection, unspecified: Secondary | ICD-10-CM

## 2017-03-03 DIAGNOSIS — E790 Hyperuricemia without signs of inflammatory arthritis and tophaceous disease: Secondary | ICD-10-CM | POA: Diagnosis not present

## 2017-03-03 DIAGNOSIS — E1165 Type 2 diabetes mellitus with hyperglycemia: Secondary | ICD-10-CM

## 2017-03-03 DIAGNOSIS — IMO0001 Reserved for inherently not codable concepts without codable children: Secondary | ICD-10-CM

## 2017-03-03 NOTE — Assessment & Plan Note (Signed)
BP well controlled Current regimen effective and well tolerated Continue current medications at current doses  

## 2017-03-03 NOTE — Assessment & Plan Note (Signed)
Controlled No gout Continue allopurinol

## 2017-03-03 NOTE — Patient Instructions (Signed)
  We reviewed your blood work.  Medications reviewed and updated.  No changes recommended at this time.   Please followup in 6 months for a physical

## 2017-03-03 NOTE — Progress Notes (Signed)
Pre visit review using our clinic review tool, if applicable. No additional management support is needed unless otherwise documented below in the visit note. 

## 2017-03-03 NOTE — Assessment & Plan Note (Signed)
Sugars less than 120 at home a1c 6.9% Continue metformin at current dose She is exercising regularly

## 2017-03-03 NOTE — Assessment & Plan Note (Signed)
She did have blood work suggestive of pancreatitis, but Ct scan and history do not suggest typical pancreatitis Symptoms have resolved LFTs almost normal Will see Dr Loreta Ave this week ? Related to supplement she was taking from [place]

## 2017-03-17 DIAGNOSIS — R112 Nausea with vomiting, unspecified: Secondary | ICD-10-CM | POA: Diagnosis not present

## 2017-03-17 DIAGNOSIS — R1013 Epigastric pain: Secondary | ICD-10-CM | POA: Diagnosis not present

## 2017-04-08 ENCOUNTER — Other Ambulatory Visit: Payer: Self-pay | Admitting: Internal Medicine

## 2017-04-16 ENCOUNTER — Telehealth: Payer: Self-pay | Admitting: *Deleted

## 2017-04-16 MED ORDER — LOSARTAN POTASSIUM 50 MG PO TABS: 50.0000 mg | ORAL_TABLET | Freq: Every day | ORAL | 1 refills | Status: DC

## 2017-04-16 NOTE — Telephone Encounter (Signed)
Rec'd fax stating pt has transferred to us ([facility] He is needing refill on his Losartan. [facility].../lmb

## 2017-05-08 ENCOUNTER — Other Ambulatory Visit: Payer: Self-pay | Admitting: Internal Medicine

## 2017-06-18 ENCOUNTER — Other Ambulatory Visit: Payer: Self-pay | Admitting: Emergency Medicine

## 2017-06-18 DIAGNOSIS — E1165 Type 2 diabetes mellitus with hyperglycemia: Secondary | ICD-10-CM

## 2017-06-18 MED ORDER — METFORMIN HCL 500 MG PO TABS: 500.0000 mg | ORAL_TABLET | Freq: Every day | ORAL | 1 refills | Status: DC

## 2017-06-23 DIAGNOSIS — M545 Low back pain: Secondary | ICD-10-CM | POA: Diagnosis not present

## 2017-06-23 DIAGNOSIS — M1711 Unilateral primary osteoarthritis, right knee: Secondary | ICD-10-CM | POA: Diagnosis not present

## 2017-06-30 DIAGNOSIS — M1711 Unilateral primary osteoarthritis, right knee: Secondary | ICD-10-CM | POA: Diagnosis not present

## 2017-07-04 DIAGNOSIS — M25561 Pain in right knee: Secondary | ICD-10-CM | POA: Diagnosis not present

## 2017-07-06 DIAGNOSIS — S83241A Other tear of medial meniscus, current injury, right knee, initial encounter: Secondary | ICD-10-CM | POA: Diagnosis not present

## 2017-07-07 DIAGNOSIS — Z124 Encounter for screening for malignant neoplasm of cervix: Secondary | ICD-10-CM | POA: Diagnosis not present

## 2017-09-02 DIAGNOSIS — S83241D Other tear of medial meniscus, current injury, right knee, subsequent encounter: Secondary | ICD-10-CM | POA: Diagnosis not present

## 2017-09-02 DIAGNOSIS — M25562 Pain in left knee: Secondary | ICD-10-CM | POA: Diagnosis not present

## 2017-09-02 DIAGNOSIS — R531 Weakness: Secondary | ICD-10-CM | POA: Diagnosis not present

## 2017-09-02 DIAGNOSIS — M25561 Pain in right knee: Secondary | ICD-10-CM | POA: Diagnosis not present

## 2017-09-07 ENCOUNTER — Other Ambulatory Visit: Payer: Self-pay | Admitting: Emergency Medicine

## 2017-09-07 ENCOUNTER — Other Ambulatory Visit (INDEPENDENT_AMBULATORY_CARE_PROVIDER_SITE_OTHER): Payer: BLUE CROSS/BLUE SHIELD

## 2017-09-07 DIAGNOSIS — Z8639 Personal history of other endocrine, nutritional and metabolic disease: Secondary | ICD-10-CM

## 2017-09-07 DIAGNOSIS — E139 Other specified diabetes mellitus without complications: Secondary | ICD-10-CM

## 2017-09-07 DIAGNOSIS — E782 Mixed hyperlipidemia: Secondary | ICD-10-CM | POA: Diagnosis not present

## 2017-09-07 DIAGNOSIS — I10 Essential (primary) hypertension: Secondary | ICD-10-CM

## 2017-09-07 DIAGNOSIS — M1711 Unilateral primary osteoarthritis, right knee: Secondary | ICD-10-CM | POA: Diagnosis not present

## 2017-09-07 DIAGNOSIS — M25561 Pain in right knee: Secondary | ICD-10-CM | POA: Diagnosis not present

## 2017-09-07 DIAGNOSIS — M25562 Pain in left knee: Secondary | ICD-10-CM | POA: Diagnosis not present

## 2017-09-07 DIAGNOSIS — R531 Weakness: Secondary | ICD-10-CM | POA: Diagnosis not present

## 2017-09-07 LAB — CBC WITH DIFFERENTIAL/PLATELET
Basophils Absolute: 0.1 10*3/uL (ref 0.0–0.1)
Basophils Relative: 1 % (ref 0.0–3.0)
Eosinophils Absolute: 0.2 10*3/uL (ref 0.0–0.7)
Eosinophils Relative: 1.7 % (ref 0.0–5.0)
HCT: 40.3 % (ref 36.0–46.0)
Hemoglobin: 13.7 g/dL (ref 12.0–15.0)
Lymphocytes Relative: 38.4 % (ref 12.0–46.0)
Lymphs Abs: 4.4 10*3/uL — ABNORMAL HIGH (ref 0.7–4.0)
MCHC: 34.1 g/dL (ref 30.0–36.0)
MCV: 86.7 fl (ref 78.0–100.0)
Monocytes Absolute: 0.5 10*3/uL (ref 0.1–1.0)
Monocytes Relative: 4.6 % (ref 3.0–12.0)
Neutro Abs: 6.2 10*3/uL (ref 1.4–7.7)
Neutrophils Relative %: 54.3 % (ref 43.0–77.0)
Platelets: 380 10*3/uL (ref 150.0–400.0)
RBC: 4.65 Mil/uL (ref 3.87–5.11)
RDW: 13.4 % (ref 11.5–15.5)
WBC: 11.4 10*3/uL — ABNORMAL HIGH (ref 4.0–10.5)

## 2017-09-07 LAB — COMPREHENSIVE METABOLIC PANEL
ALT: 34 U/L (ref 0–35)
AST: 19 U/L (ref 0–37)
Albumin: 4.3 g/dL (ref 3.5–5.2)
Alkaline Phosphatase: 79 U/L (ref 39–117)
BUN: 11 mg/dL (ref 6–23)
CO2: 24 mEq/L (ref 19–32)
Calcium: 9.9 mg/dL (ref 8.4–10.5)
Chloride: 102 mEq/L (ref 96–112)
Creatinine, Ser: 0.62 mg/dL (ref 0.40–1.20)
GFR: 107.15 mL/min (ref >60.00)
Glucose, Bld: 115 mg/dL — ABNORMAL HIGH (ref 70–99)
Potassium: 4.1 mEq/L (ref 3.5–5.1)
Sodium: 138 mEq/L (ref 135–145)
Total Bilirubin: 0.4 mg/dL (ref 0.2–1.2)
Total Protein: 7.6 g/dL (ref 6.0–8.3)

## 2017-09-07 LAB — TSH: TSH: 2.06 u[IU]/mL (ref 0.35–4.50)

## 2017-09-07 LAB — LDL CHOLESTEROL, DIRECT: Direct LDL: 102 mg/dL

## 2017-09-07 LAB — HEMOGLOBIN A1C: Hgb A1c MFr Bld: 7.3 % — ABNORMAL HIGH (ref 4.6–6.5)

## 2017-09-07 LAB — LIPID PANEL
Cholesterol: 179 mg/dL (ref 0–200)
HDL: 41.1 mg/dL (ref >39.00)
NonHDL: 137.59
Total CHOL/HDL Ratio: 4
Triglycerides: 293 mg/dL — ABNORMAL HIGH (ref 0.0–149.0)
VLDL: 58.6 mg/dL — ABNORMAL HIGH (ref 0.0–40.0)

## 2017-09-08 NOTE — Patient Instructions (Addendum)
Test(s) ordered today. Your results will be released to [place] (or called to you) after review, usually within 72hours after test completion. If any changes need to be made, you will be notified at that same time.  All other Health Maintenance issues reviewed.   All recommended immunizations and age-appropriate screenings are up-to-date or discussed.  Flu and tetanus immunizations administered today.    Medications reviewed and updated.  Changes include increasing metformin to 500 mg twice daily.    Your prescription(s) have been submitted to your pharmacy. Please take as directed and contact our office if you believe you are having problem(s) with the medication(s).   Please followup in 6 months   Health Maintenance, Female Adopting a healthy lifestyle and getting preventive care can go a long way to promote health and wellness. Talk with your health care provider about what schedule of regular examinations is right for you. This is a good chance for you to check in with your provider about disease prevention and staying healthy. In between checkups, there are plenty of things you can do on your own. Experts have done a lot of research about which lifestyle changes and preventive measures are most likely to keep you healthy. Ask your health care provider for more information. Weight and diet Eat a healthy diet  Be sure to include plenty of vegetables, fruits, low-fat dairy products, and lean protein.  Do not eat a lot of foods high in solid fats, added sugars, or salt.  Get regular exercise. This is one of the most important things you can do for your health. ? Most adults should exercise for at least 150 minutes each week. The exercise should increase your heart rate and make you sweat (moderate-intensity exercise). ? Most adults should also do strengthening exercises at least twice a week. This is in addition to the moderate-intensity exercise.  Maintain a healthy weight  Body  mass index (BMI) is a measurement that can be used to identify possible weight problems. It estimates body fat based on height and weight. Your health care provider can help determine your BMI and help you achieve or maintain a healthy weight.  For females 60 years of age and older: ? A BMI below 18.5 is considered underweight. ? A BMI of 18.5 to 24.9 is normal. ? A BMI of 25 to 29.9 is considered overweight. ? A BMI of 30 and above is considered obese.  Watch levels of cholesterol and blood lipids  You should start having your blood tested for lipids and cholesterol at 52 years of age, then have this test every 5 years.  You may need to have your cholesterol levels checked more often if: ? Your lipid or cholesterol levels are high. ? You are older than 52 years of age. ? You are at high risk for heart disease.  Cancer screening Lung Cancer  Lung cancer screening is recommended for adults 80-5 years old who are at high risk for lung cancer because of a history of smoking.  A yearly low-dose CT scan of the lungs is recommended for people who: ? Currently smoke. ? Have quit within the past 15 years. ? Have at least a 30-pack-year history of smoking. A pack year is smoking an average of one pack of cigarettes a day for 1 year.  Yearly screening should continue until it has been 15 years since you quit.  Yearly screening should stop if you develop a health problem that would prevent you from having lung cancer  treatment.  Breast Cancer  Practice breast self-awareness. This means understanding how your breasts normally appear and feel.  It also means doing regular breast self-exams. Let your health care provider know about any changes, no matter how small.  If you are in your 20s or 30s, you should have a clinical breast exam (CBE) by a health care provider every 1-3 years as part of a regular health exam.  If you are 40 or older, have a CBE every year. Also consider having a  breast X-ray (mammogram) every year.  If you have a family history of breast cancer, talk to your health care provider about genetic screening.  If you are at high risk for breast cancer, talk to your health care provider about having an MRI and a mammogram every year.  Breast cancer gene (BRCA) assessment is recommended for women who have family members with BRCA-related cancers. BRCA-related cancers include: ? Breast. ? Ovarian. ? Tubal. ? Peritoneal cancers.  Results of the assessment will determine the need for genetic counseling and BRCA1 and BRCA2 testing.  Cervical Cancer Your health care provider may recommend that you be screened regularly for cancer of the pelvic organs (ovaries, uterus, and vagina). This screening involves a pelvic examination, including checking for microscopic changes to the surface of your cervix (Pap test). You may be encouraged to have this screening done every 3 years, beginning at age 21.  For women ages 30-65, health care providers may recommend pelvic exams and Pap testing every 3 years, or they may recommend the Pap and pelvic exam, combined with testing for human papilloma virus (HPV), every 5 years. Some types of HPV increase your risk of cervical cancer. Testing for HPV may also be done on women of any age with unclear Pap test results.  Other health care providers may not recommend any screening for nonpregnant women who are considered low risk for pelvic cancer and who do not have symptoms. Ask your health care provider if a screening pelvic exam is right for you.  If you have had past treatment for cervical cancer or a condition that could lead to cancer, you need Pap tests and screening for cancer for at least 20 years after your treatment. If Pap tests have been discontinued, your risk factors (such as having a new sexual partner) need to be reassessed to determine if screening should resume. Some women have medical problems that increase the chance  of getting cervical cancer. In these cases, your health care provider may recommend more frequent screening and Pap tests.  Colorectal Cancer  This type of cancer can be detected and often prevented.  Routine colorectal cancer screening usually begins at 52 years of age and continues through 52 years of age.  Your health care provider may recommend screening at an earlier age if you have risk factors for colon cancer.  Your health care provider may also recommend using home test kits to check for hidden blood in the stool.  A small camera at the end of a tube can be used to examine your colon directly (sigmoidoscopy or colonoscopy). This is done to check for the earliest forms of colorectal cancer.  Routine screening usually begins at age 50.  Direct examination of the colon should be repeated every 5-10 years through 52 years of age. However, you may need to be screened more often if early forms of precancerous polyps or small growths are found.  Skin Cancer  Check your skin from head to toe regularly.    Tell your health care provider about any new moles or changes in moles, especially if there is a change in a mole's shape or color.  Also tell your health care provider if you have a mole that is larger than the size of a pencil eraser.  Always use sunscreen. Apply sunscreen liberally and repeatedly throughout the day.  Protect yourself by wearing long sleeves, pants, a wide-brimmed hat, and sunglasses whenever you are outside.  Heart disease, diabetes, and high blood pressure  High blood pressure causes heart disease and increases the risk of stroke. High blood pressure is more likely to develop in: ? People who have blood pressure in the high end of the normal range (130-139/85-89 mm Hg). ? People who are overweight or obese. ? People who are African American.  If you are 18-39 years of age, have your blood pressure checked every 3-5 years. If you are 40 years of age or older,  have your blood pressure checked every year. You should have your blood pressure measured twice-once when you are at a hospital or clinic, and once when you are not at a hospital or clinic. Record the average of the two measurements. To check your blood pressure when you are not at a hospital or clinic, you can use: ? An automated blood pressure machine at a pharmacy. ? A home blood pressure monitor.  If you are between 55 years and 79 years old, ask your health care provider if you should take aspirin to prevent strokes.  Have regular diabetes screenings. This involves taking a blood sample to check your fasting blood sugar level. ? If you are at a normal weight and have a low risk for diabetes, have this test once every three years after 52 years of age. ? If you are overweight and have a high risk for diabetes, consider being tested at a younger age or more often. Preventing infection Hepatitis B  If you have a higher risk for hepatitis B, you should be screened for this virus. You are considered at high risk for hepatitis B if: ? You were born in a country where hepatitis B is common. Ask your health care provider which countries are considered high risk. ? Your parents were born in a high-risk country, and you have not been immunized against hepatitis B (hepatitis B vaccine). ? You have HIV or AIDS. ? You use needles to inject street drugs. ? You live with someone who has hepatitis B. ? You have had sex with someone who has hepatitis B. ? You get hemodialysis treatment. ? You take certain medicines for conditions, including cancer, organ transplantation, and autoimmune conditions.  Hepatitis C  Blood testing is recommended for: ? Everyone born from 1945 through 1965. ? Anyone with known risk factors for hepatitis C.  Sexually transmitted infections (STIs)  You should be screened for sexually transmitted infections (STIs) including gonorrhea and chlamydia if: ? You are sexually  active and are younger than 52 years of age. ? You are older than 52 years of age and your health care provider tells you that you are at risk for this type of infection. ? Your sexual activity has changed since you were last screened and you are at an increased risk for chlamydia or gonorrhea. Ask your health care provider if you are at risk.  If you do not have HIV, but are at risk, it may be recommended that you take a prescription medicine daily to prevent HIV infection. This is called   pre-exposure prophylaxis (PrEP). You are considered at risk if: ? You are sexually active and do not regularly use condoms or know the HIV status of your partner(s). ? You take drugs by injection. ? You are sexually active with a partner who has HIV.  Talk with your health care provider about whether you are at high risk of being infected with HIV. If you choose to begin PrEP, you should first be tested for HIV. You should then be tested every 3 months for as long as you are taking PrEP. Pregnancy  If you are premenopausal and you may become pregnant, ask your health care provider about preconception counseling.  If you may become pregnant, take 400 to 800 micrograms (mcg) of folic acid every day.  If you want to prevent pregnancy, talk to your health care provider about birth control (contraception). Osteoporosis and menopause  Osteoporosis is a disease in which the bones lose minerals and strength with aging. This can result in serious bone fractures. Your risk for osteoporosis can be identified using a bone density scan.  If you are 58 years of age or older, or if you are at risk for osteoporosis and fractures, ask your health care provider if you should be screened.  Ask your health care provider whether you should take a calcium or vitamin D supplement to lower your risk for osteoporosis.  Menopause may have certain physical symptoms and risks.  Hormone replacement therapy may reduce some of these  symptoms and risks. Talk to your health care provider about whether hormone replacement therapy is right for you. Follow these instructions at home:  Schedule regular health, dental, and eye exams.  Stay current with your immunizations.  Do not use any tobacco products including cigarettes, chewing tobacco, or electronic cigarettes.  If you are pregnant, do not drink alcohol.  If you are breastfeeding, limit how much and how often you drink alcohol.  Limit alcohol intake to no more than 1 drink per day for nonpregnant women. One drink equals 12 ounces of beer, 5 ounces of wine, or 1 ounces of hard liquor.  Do not use street drugs.  Do not share needles.  Ask your health care provider for help if you need support or information about quitting drugs.  Tell your health care provider if you often feel depressed.  Tell your health care provider if you have ever been abused or do not feel safe at home. This information is not intended to replace advice given to you by your health care provider. Make sure you discuss any questions you have with your health care provider. Document Released: 06/02/2011 Document Revised: 04/24/2016 Document Reviewed: 08/21/2015 Elsevier Interactive Patient Education  [employer].

## 2017-09-08 NOTE — Progress Notes (Signed)
Subjective:    Patient ID: Savannah Rogers, female    DOB: 16-Mar-1966, 51 y.o.   MRN: [id number]  HPI She is here for a physical exam.   Her sugars at home are less than 130.  She was exercising on the treadmill, but increased knee pain made her stop  She has seen ortho and was on a pill - ? Steroid.  She has not been exercising x 2-3 months but can ride a bike.  She has no concerns     Medications and allergies reviewed with patient and updated if appropriate.  Patient Active Problem List   Diagnosis Date Noted  . Pancreatitis 03/03/2017  . Abnormal CXR 04/19/2016  . Upper airway cough syndrome likely related to allergic rhinitis  04/10/2016  . Essential hypertension, benign 03/05/2016  . Non-allergic rhinitis 06/13/2015  . Vitamin D deficiency 05/22/2015  . Diabetes ([place]) 12/04/2014  . Hyperuricemia 03/24/2013  . Mixed hyperlipidemia 03/06/2008    Current Outpatient Prescriptions on File Prior to Visit  Medication Sig Dispense Refill  . allopurinol (ZYLOPRIM) 300 MG tablet TAKE 1 TABLET (300 MG TOTAL) BY MOUTH DAILY. 90 tablet 2  . atorvastatin (LIPITOR) 10 MG tablet TAKE 1 TABLET BY MOUTH DAILY 90 tablet 1  . lisinopril (PRINIVIL,ZESTRIL) 40 MG tablet Take 40 mg by mouth daily.    Marland Kitchen losartan (COZAAR) 50 MG tablet Take 1 tablet (50 mg total) by mouth daily. 90 tablet 1  . pantoprazole (PROTONIX) 40 MG tablet Take 40 mg by mouth daily as needed.     No current facility-administered medications on file prior to visit.     Past Medical History:  Diagnosis Date  . Diabetes mellitus without complication ([place])   . Hyperlipidemia    NMR 05-2010:ldl 55(1415/711),HDL 48, TG 173. NO FH of MI.[place] study LDL goal  LDL goal =<160  . Hypertension   . Hyperuricemia     Past Surgical History:  Procedure Laterality Date  . BONE MARROW BIOPSY  2006   for elevated WBC  . CHOLECYSTECTOMY  03/16/2012   INTRAOPERATIVE CHOLANGIOGRAM;  Surgeon: Earnstine Regal, MD;  Location: [facility];   Service: General;  Laterality: N/A;  . G3  P1  A2    . laparascopic cholecystectomy  03/2012   Dr Harlow Asa    Social History   Social History  . Marital status: Single    Spouse name: N/A  . Number of children: 1  . Years of education: N/A   Occupational History  .  [employer]  . [place]   Social History Main Topics  . Smoking status: Never Smoker  . Smokeless tobacco: Never Used  . Alcohol use No  . Drug use: No  . Sexual activity: Not Asked   Other Topics Concern  . None   Social History Narrative  . None    Family History  Problem Relation Age of Onset  . Stroke Paternal Grandmother 5  . Stroke Paternal Grandfather 34  . Hypertension Father   . Hypertension Mother   . Breast cancer Maternal Aunt   . Diabetes Neg Hx   . Heart disease Neg Hx     Review of Systems  Constitutional: Negative for chills and fever.  Eyes: Negative for visual disturbance.  Respiratory: Negative for cough, shortness of breath and wheezing.   Cardiovascular: Negative for chest pain, palpitations and leg swelling.  Gastrointestinal: Negative for abdominal pain, blood in stool, constipation, diarrhea and nausea.  Genitourinary: Negative  for dysuria and hematuria.  Musculoskeletal: Positive for arthralgias (knee pain).  Skin: Negative for color change and rash.  Neurological: Negative for light-headedness and headaches.  Psychiatric/Behavioral: Negative for dysphoric mood. The patient is not nervous/anxious.        Objective:   Vitals:   09/09/17 0911  BP: 128/84  Pulse: 71  Resp: 16  Temp: 98.6 F (37 C)  SpO2: 98%   Filed Weights   09/09/17 0911  Weight: 148 lb (67.1 kg)   Body mass index is 27.07 kg/m.  Wt Readings from Last 3 Encounters:  09/09/17 148 lb (67.1 kg)  03/03/17 147 lb (66.7 kg)  02/23/17 144 lb (65.3 kg)     Physical Exam Constitutional: She appears well-developed and well-nourished. No distress.  HENT:  Head:  Normocephalic and atraumatic.  Right Ear: External ear normal. Normal ear canal and TM Left Ear: External ear normal.  Normal ear canal and TM Mouth/Throat: Oropharynx is clear and moist.  Eyes: Conjunctivae and EOM are normal.  Neck: Neck supple. No tracheal deviation present. No thyromegaly present.  No carotid bruit  Cardiovascular: Normal rate, regular rhythm and normal heart sounds.   No murmur heard.  No edema. Pulmonary/Chest: Effort normal and breath sounds normal. No respiratory distress. She has no wheezes. She has no rales.  Breast: deferred to Gyn Abdominal: Soft. She exhibits no distension. There is no tenderness.  Lymphadenopathy: She has no cervical adenopathy.  Skin: Skin is warm and dry. She is not diaphoretic.  Psychiatric: She has a normal mood and affect. Her behavior is normal.   Diabetic Foot Exam - Simple   Simple Foot Form Diabetic Foot exam was performed with the following findings:  Yes   Visual Inspection No deformities, no ulcerations, no other skin breakdown bilaterally:  Yes Sensation Testing Intact to touch and monofilament testing bilaterally:  Yes Pulse Check Posterior Tibialis and Dorsalis pulse intact bilaterally:  Yes Comments         Assessment & Plan:   Physical exam: Screening blood work ordered Immunizations  Td and flu today, pneumovax due - deferred today Colonoscopy  Up to date  Mammogram   Up to date  Gyn   Up to date  Eye exams   Up to date  EKG   Last done 2014 Exercise  None right now - will restart riding a bike Weight  Advised weight loss Skin   No concerns Substance abuse   none  See Problem List for Assessment and Plan of chronic medical problems.   FU in 6 months

## 2017-09-09 ENCOUNTER — Other Ambulatory Visit (INDEPENDENT_AMBULATORY_CARE_PROVIDER_SITE_OTHER): Payer: BLUE CROSS/BLUE SHIELD

## 2017-09-09 ENCOUNTER — Encounter: Payer: Self-pay | Admitting: Internal Medicine

## 2017-09-09 ENCOUNTER — Ambulatory Visit (INDEPENDENT_AMBULATORY_CARE_PROVIDER_SITE_OTHER): Payer: BLUE CROSS/BLUE SHIELD | Admitting: Internal Medicine

## 2017-09-09 VITALS — BP 128/84 | HR 71 | Temp 98.6°F | Resp 16 | Wt 148.0 lb

## 2017-09-09 DIAGNOSIS — E1165 Type 2 diabetes mellitus with hyperglycemia: Secondary | ICD-10-CM

## 2017-09-09 DIAGNOSIS — Z23 Encounter for immunization: Secondary | ICD-10-CM

## 2017-09-09 DIAGNOSIS — E119 Type 2 diabetes mellitus without complications: Secondary | ICD-10-CM | POA: Insufficient documentation

## 2017-09-09 DIAGNOSIS — M25562 Pain in left knee: Secondary | ICD-10-CM | POA: Diagnosis not present

## 2017-09-09 DIAGNOSIS — Z0000 Encounter for general adult medical examination without abnormal findings: Secondary | ICD-10-CM

## 2017-09-09 DIAGNOSIS — E559 Vitamin D deficiency, unspecified: Secondary | ICD-10-CM

## 2017-09-09 DIAGNOSIS — S83241D Other tear of medial meniscus, current injury, right knee, subsequent encounter: Secondary | ICD-10-CM | POA: Diagnosis not present

## 2017-09-09 DIAGNOSIS — R531 Weakness: Secondary | ICD-10-CM | POA: Diagnosis not present

## 2017-09-09 DIAGNOSIS — E1169 Type 2 diabetes mellitus with other specified complication: Secondary | ICD-10-CM | POA: Insufficient documentation

## 2017-09-09 DIAGNOSIS — E782 Mixed hyperlipidemia: Secondary | ICD-10-CM

## 2017-09-09 DIAGNOSIS — I10 Essential (primary) hypertension: Secondary | ICD-10-CM

## 2017-09-09 DIAGNOSIS — E790 Hyperuricemia without signs of inflammatory arthritis and tophaceous disease: Secondary | ICD-10-CM | POA: Diagnosis not present

## 2017-09-09 DIAGNOSIS — E139 Other specified diabetes mellitus without complications: Secondary | ICD-10-CM

## 2017-09-09 DIAGNOSIS — M25561 Pain in right knee: Secondary | ICD-10-CM | POA: Diagnosis not present

## 2017-09-09 LAB — VITAMIN D 25 HYDROXY (VIT D DEFICIENCY, FRACTURES): VITD: 31.11 ng/mL (ref 30.00–100.00)

## 2017-09-09 MED ORDER — METFORMIN HCL 500 MG PO TABS: 500.0000 mg | ORAL_TABLET | Freq: Two times a day (BID) | ORAL | 1 refills | Status: DC

## 2017-09-09 MED ORDER — ULTRAFLORA IMMUNE HEALTH 170 MG PO CAPS: ORAL_CAPSULE | ORAL | Status: DC

## 2017-09-09 NOTE — Assessment & Plan Note (Signed)
Lab Results  Component Value Date   HGBA1C 7.3 (H) 09/07/2017    Increase metformin to BID Continue regular exercise Work on weight loss Low sugar/carb diet

## 2017-09-09 NOTE — Assessment & Plan Note (Signed)
Not taking vitamin d, taking a MVI Check d level

## 2017-09-09 NOTE — Assessment & Plan Note (Signed)
Continue allopurinol 

## 2017-09-09 NOTE — Assessment & Plan Note (Signed)
BP well controlled Current regimen effective and well tolerated Continue current medications at current doses cmp normal 

## 2017-09-09 NOTE — Assessment & Plan Note (Signed)
Lab Results  Component Value Date   HGBA1C 7.3 (H) 09/07/2017   Increase metformin to BID Increase/restart exercise Weight loss Diabetic diet F/u in 6 months

## 2017-09-09 NOTE — Assessment & Plan Note (Signed)
triglycerides high and VLDL high LDL well controlled Continue statin

## 2017-09-14 DIAGNOSIS — M25562 Pain in left knee: Secondary | ICD-10-CM | POA: Diagnosis not present

## 2017-09-14 DIAGNOSIS — M1711 Unilateral primary osteoarthritis, right knee: Secondary | ICD-10-CM | POA: Diagnosis not present

## 2017-09-14 DIAGNOSIS — M25561 Pain in right knee: Secondary | ICD-10-CM | POA: Diagnosis not present

## 2017-09-14 DIAGNOSIS — S83241D Other tear of medial meniscus, current injury, right knee, subsequent encounter: Secondary | ICD-10-CM | POA: Diagnosis not present

## 2017-09-15 ENCOUNTER — Other Ambulatory Visit: Payer: Self-pay | Admitting: Obstetrics and Gynecology

## 2017-09-15 DIAGNOSIS — Z1231 Encounter for screening mammogram for malignant neoplasm of breast: Secondary | ICD-10-CM

## 2017-09-16 DIAGNOSIS — M25561 Pain in right knee: Secondary | ICD-10-CM | POA: Diagnosis not present

## 2017-09-16 DIAGNOSIS — M25562 Pain in left knee: Secondary | ICD-10-CM | POA: Diagnosis not present

## 2017-09-16 DIAGNOSIS — S83241D Other tear of medial meniscus, current injury, right knee, subsequent encounter: Secondary | ICD-10-CM | POA: Diagnosis not present

## 2017-09-16 DIAGNOSIS — R531 Weakness: Secondary | ICD-10-CM | POA: Diagnosis not present

## 2017-09-23 DIAGNOSIS — M25562 Pain in left knee: Secondary | ICD-10-CM | POA: Diagnosis not present

## 2017-09-23 DIAGNOSIS — R531 Weakness: Secondary | ICD-10-CM | POA: Diagnosis not present

## 2017-09-23 DIAGNOSIS — M25561 Pain in right knee: Secondary | ICD-10-CM | POA: Diagnosis not present

## 2017-09-23 DIAGNOSIS — M1711 Unilateral primary osteoarthritis, right knee: Secondary | ICD-10-CM | POA: Diagnosis not present

## 2017-09-28 DIAGNOSIS — M1711 Unilateral primary osteoarthritis, right knee: Secondary | ICD-10-CM | POA: Diagnosis not present

## 2017-09-28 DIAGNOSIS — S83241D Other tear of medial meniscus, current injury, right knee, subsequent encounter: Secondary | ICD-10-CM | POA: Diagnosis not present

## 2017-09-28 DIAGNOSIS — M25561 Pain in right knee: Secondary | ICD-10-CM | POA: Diagnosis not present

## 2017-09-28 DIAGNOSIS — M25562 Pain in left knee: Secondary | ICD-10-CM | POA: Diagnosis not present

## 2017-09-28 IMAGING — DX DG CHEST 2V
2 series · 2 of 2 positions shown · non-contrast
Comparison: PA and lateral chest x-ray November 21, 2011

CLINICAL DATA: Two months of productive cough and shortness of
breath.

EXAM:
CHEST  2 VIEW

[chest pa]
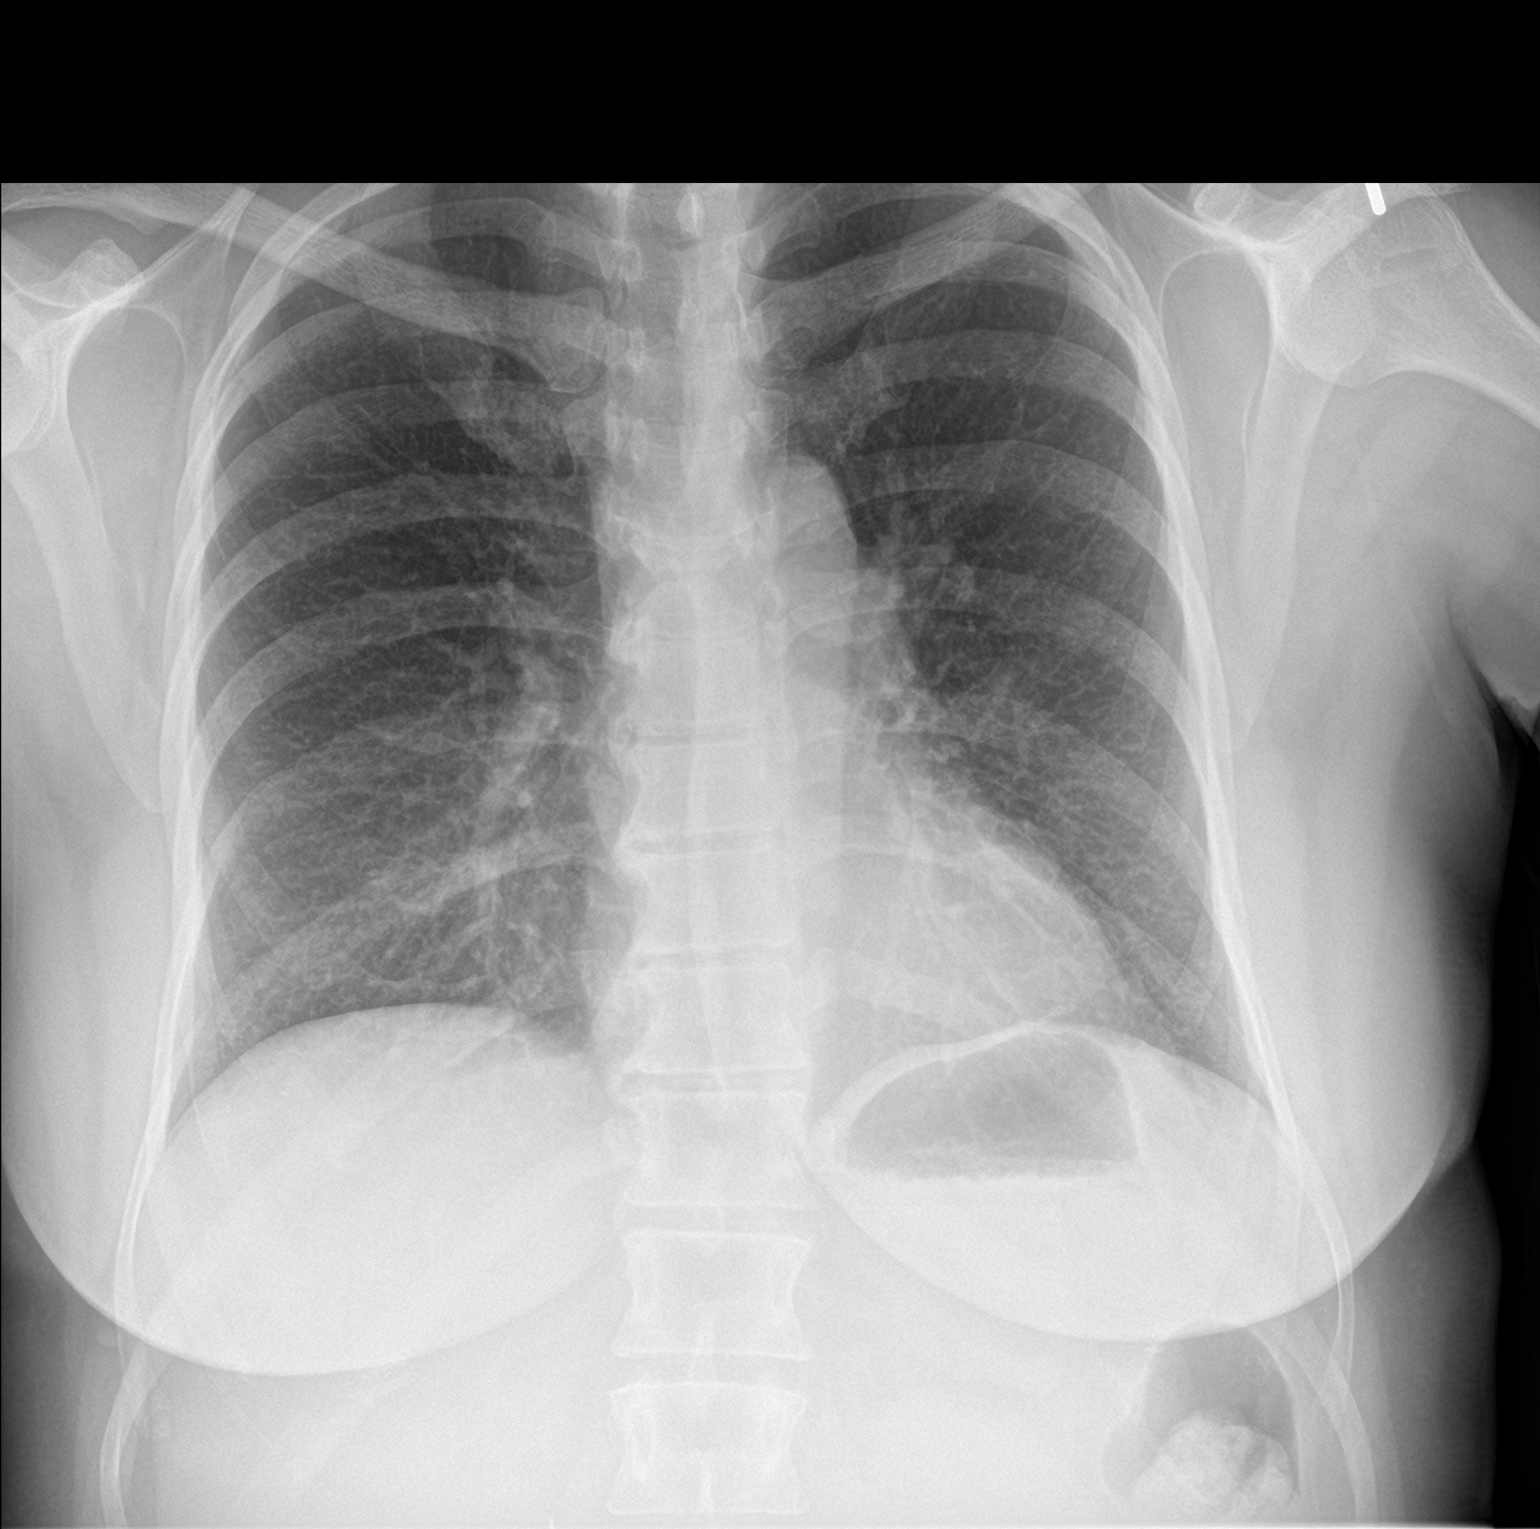

[chest lat]
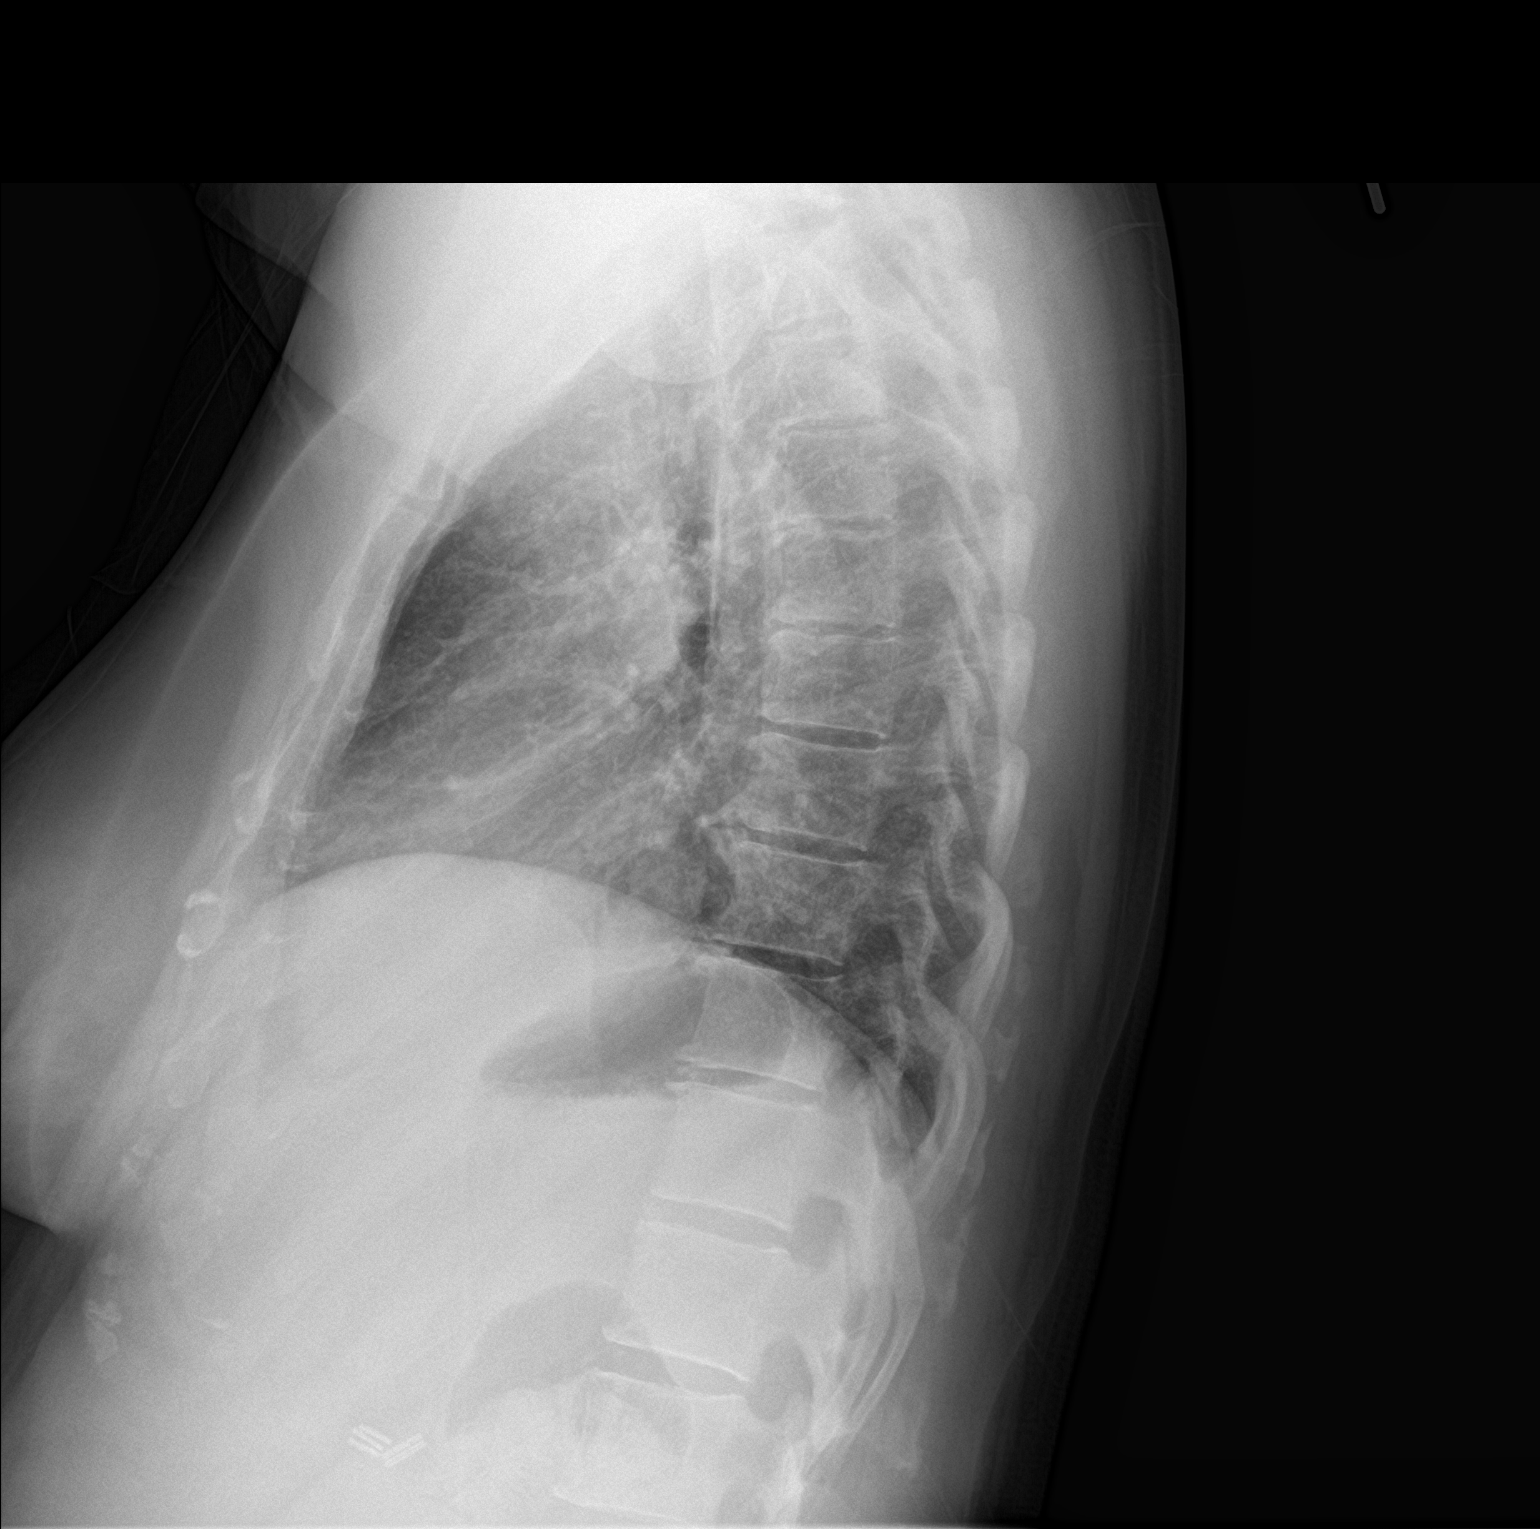

[2 of 2 positions shown; findings below may reference images not displayed]

FINDINGS: The lungs are well-expanded. There is no focal infiltrate. The
interstitial markings are increased bilaterally in a micronodular
pattern. The heart is normal in size. The pulmonary vascularity is
not engorged. The bony thorax exhibits no acute abnormality.
IMPRESSION: New increase in the interstitial markings with a micronodular
pattern. This could reflect atypical infection. There is no CHF or
pleural effusion.

Chest CT scanning is recommended to further evaluate the
interstitium.

## 2017-09-30 DIAGNOSIS — M25562 Pain in left knee: Secondary | ICD-10-CM | POA: Diagnosis not present

## 2017-09-30 DIAGNOSIS — R531 Weakness: Secondary | ICD-10-CM | POA: Diagnosis not present

## 2017-09-30 DIAGNOSIS — S83241D Other tear of medial meniscus, current injury, right knee, subsequent encounter: Secondary | ICD-10-CM | POA: Diagnosis not present

## 2017-09-30 DIAGNOSIS — M25561 Pain in right knee: Secondary | ICD-10-CM | POA: Diagnosis not present

## 2017-10-02 ENCOUNTER — Ambulatory Visit: Payer: BLUE CROSS/BLUE SHIELD

## 2017-10-05 DIAGNOSIS — M1711 Unilateral primary osteoarthritis, right knee: Secondary | ICD-10-CM | POA: Diagnosis not present

## 2017-10-05 DIAGNOSIS — M25562 Pain in left knee: Secondary | ICD-10-CM | POA: Diagnosis not present

## 2017-10-05 DIAGNOSIS — M25561 Pain in right knee: Secondary | ICD-10-CM | POA: Diagnosis not present

## 2017-10-05 DIAGNOSIS — R531 Weakness: Secondary | ICD-10-CM | POA: Diagnosis not present

## 2017-10-07 DIAGNOSIS — M25561 Pain in right knee: Secondary | ICD-10-CM | POA: Diagnosis not present

## 2017-10-07 DIAGNOSIS — R531 Weakness: Secondary | ICD-10-CM | POA: Diagnosis not present

## 2017-10-07 DIAGNOSIS — S83241D Other tear of medial meniscus, current injury, right knee, subsequent encounter: Secondary | ICD-10-CM | POA: Diagnosis not present

## 2017-10-07 DIAGNOSIS — M25562 Pain in left knee: Secondary | ICD-10-CM | POA: Diagnosis not present

## 2017-10-13 DIAGNOSIS — M25562 Pain in left knee: Secondary | ICD-10-CM | POA: Diagnosis not present

## 2017-10-13 DIAGNOSIS — M1711 Unilateral primary osteoarthritis, right knee: Secondary | ICD-10-CM | POA: Diagnosis not present

## 2017-10-13 DIAGNOSIS — R531 Weakness: Secondary | ICD-10-CM | POA: Diagnosis not present

## 2017-10-13 DIAGNOSIS — M25561 Pain in right knee: Secondary | ICD-10-CM | POA: Diagnosis not present

## 2017-10-15 DIAGNOSIS — S83241D Other tear of medial meniscus, current injury, right knee, subsequent encounter: Secondary | ICD-10-CM | POA: Diagnosis not present

## 2017-10-15 DIAGNOSIS — M25562 Pain in left knee: Secondary | ICD-10-CM | POA: Diagnosis not present

## 2017-10-15 DIAGNOSIS — M25561 Pain in right knee: Secondary | ICD-10-CM | POA: Diagnosis not present

## 2017-10-15 DIAGNOSIS — M1711 Unilateral primary osteoarthritis, right knee: Secondary | ICD-10-CM | POA: Diagnosis not present

## 2017-10-21 DIAGNOSIS — M25561 Pain in right knee: Secondary | ICD-10-CM | POA: Diagnosis not present

## 2017-10-21 DIAGNOSIS — M25562 Pain in left knee: Secondary | ICD-10-CM | POA: Diagnosis not present

## 2017-10-21 DIAGNOSIS — R531 Weakness: Secondary | ICD-10-CM | POA: Diagnosis not present

## 2017-10-21 DIAGNOSIS — S83241D Other tear of medial meniscus, current injury, right knee, subsequent encounter: Secondary | ICD-10-CM | POA: Diagnosis not present

## 2017-10-29 ENCOUNTER — Ambulatory Visit
Admission: RE | Admit: 2017-10-29 | Discharge: 2017-10-29 | Disposition: A | Discharge status: Home / Self Care | Payer: BLUE CROSS/BLUE SHIELD | Source: Ambulatory Visit | Attending: Obstetrics and Gynecology | Admitting: Obstetrics and Gynecology

## 2017-10-29 DIAGNOSIS — Z1231 Encounter for screening mammogram for malignant neoplasm of breast: Secondary | ICD-10-CM

## 2017-11-05 ENCOUNTER — Other Ambulatory Visit: Payer: Self-pay | Admitting: Internal Medicine

## 2017-12-02 LAB — HM DIABETES EYE EXAM

## 2017-12-04 ENCOUNTER — Encounter: Payer: Self-pay | Admitting: Internal Medicine

## 2018-03-09 ENCOUNTER — Other Ambulatory Visit (INDEPENDENT_AMBULATORY_CARE_PROVIDER_SITE_OTHER): Payer: BLUE CROSS/BLUE SHIELD

## 2018-03-09 ENCOUNTER — Other Ambulatory Visit: Payer: Self-pay | Admitting: Emergency Medicine

## 2018-03-09 DIAGNOSIS — E782 Mixed hyperlipidemia: Secondary | ICD-10-CM

## 2018-03-09 DIAGNOSIS — E1165 Type 2 diabetes mellitus with hyperglycemia: Secondary | ICD-10-CM

## 2018-03-09 DIAGNOSIS — I10 Essential (primary) hypertension: Secondary | ICD-10-CM | POA: Diagnosis not present

## 2018-03-09 LAB — LIPID PANEL
Cholesterol: 157 mg/dL (ref 0–200)
HDL: 36.2 mg/dL — ABNORMAL LOW (ref >39.00)
NonHDL: 120.82
Total CHOL/HDL Ratio: 4
Triglycerides: 247 mg/dL — ABNORMAL HIGH (ref 0.0–149.0)
VLDL: 49.4 mg/dL — ABNORMAL HIGH (ref 0.0–40.0)

## 2018-03-09 LAB — CBC WITH DIFFERENTIAL/PLATELET
Basophils Absolute: 0.1 10*3/uL (ref 0.0–0.1)
Basophils Relative: 1.3 % (ref 0.0–3.0)
Eosinophils Absolute: 0.2 10*3/uL (ref 0.0–0.7)
Eosinophils Relative: 1.8 % (ref 0.0–5.0)
HCT: 41.4 % (ref 36.0–46.0)
Hemoglobin: 14.2 g/dL (ref 12.0–15.0)
Lymphocytes Relative: 35.4 % (ref 12.0–46.0)
Lymphs Abs: 3.8 10*3/uL (ref 0.7–4.0)
MCHC: 34.2 g/dL (ref 30.0–36.0)
MCV: 86.9 fl (ref 78.0–100.0)
Monocytes Absolute: 0.5 10*3/uL (ref 0.1–1.0)
Monocytes Relative: 4.7 % (ref 3.0–12.0)
Neutro Abs: 6.1 10*3/uL (ref 1.4–7.7)
Neutrophils Relative %: 56.8 % (ref 43.0–77.0)
Platelets: 334 10*3/uL (ref 150.0–400.0)
RBC: 4.77 Mil/uL (ref 3.87–5.11)
RDW: 13.4 % (ref 11.5–15.5)
WBC: 10.7 10*3/uL — ABNORMAL HIGH (ref 4.0–10.5)

## 2018-03-09 LAB — LDL CHOLESTEROL, DIRECT: Direct LDL: 91 mg/dL

## 2018-03-09 LAB — COMPREHENSIVE METABOLIC PANEL
ALT: 44 U/L — ABNORMAL HIGH (ref 0–35)
AST: 28 U/L (ref 0–37)
Albumin: 4.3 g/dL (ref 3.5–5.2)
Alkaline Phosphatase: 91 U/L (ref 39–117)
BUN: 14 mg/dL (ref 6–23)
CO2: 25 mEq/L (ref 19–32)
Calcium: 9.5 mg/dL (ref 8.4–10.5)
Chloride: 103 mEq/L (ref 96–112)
Creatinine, Ser: 0.64 mg/dL (ref 0.40–1.20)
GFR: 103.1 mL/min (ref >60.00)
Glucose, Bld: 131 mg/dL — ABNORMAL HIGH (ref 70–99)
Potassium: 4.1 mEq/L (ref 3.5–5.1)
Sodium: 138 mEq/L (ref 135–145)
Total Bilirubin: 0.4 mg/dL (ref 0.2–1.2)
Total Protein: 7.7 g/dL (ref 6.0–8.3)

## 2018-03-09 LAB — URIC ACID: Uric Acid, Serum: 4.6 mg/dL (ref 2.4–7.0)

## 2018-03-09 LAB — HEMOGLOBIN A1C: Hgb A1c MFr Bld: 7.5 % — ABNORMAL HIGH (ref 4.6–6.5)

## 2018-03-09 LAB — TSH: TSH: 2.97 u[IU]/mL (ref 0.35–4.50)

## 2018-03-09 NOTE — Progress Notes (Signed)
Pt came into lab to have labs done before appt tomorrow. Labs entered, pt demanded that Uric Acid level also be ordered.

## 2018-03-09 NOTE — Patient Instructions (Addendum)
  We reviewed your blood work.  All other Health Maintenance issues reviewed.   All recommended immunizations and age-appropriate screenings are up-to-date or discussed.  Pneumonia immunization administered today.   Medications reviewed and updated.  Changes include increasing metformin 1000 mg twice daily.  Your prescription(s) have been submitted to your pharmacy. Please take as directed and contact our office if you believe you are having problem(s) with the medication(s).   Please followup in 6 months

## 2018-03-09 NOTE — Progress Notes (Signed)
Subjective:    Patient ID: Savannah Rogers, female    DOB: 09-Jan-1966, 52 y.o.   MRN: [id number]  HPI The patient is here for follow up.  Diabetes: She is taking her medication daily as prescribed. She is compliant with a diabetic diet. She is not exercising regularly. She checks her feet daily and denies foot lesions. She is up-to-date with an ophthalmology examination.  She has had increased stress and wonders if that is negatively affecting her sugars.  Hypertension: She is taking her medication daily. She is compliant with a low sodium diet.  She denies chest pain, palpitations, edema, shortness of breath and regular headaches. She is not exercising regularly.  She does not monitor her blood pressure at home.    Hyperlipidemia: She is taking her medication daily. She is compliant with a low fat/cholesterol diet. She is not exercising regularly. She denies myalgias.   Hyperuricemia:  She is taking allopurinol daily.    Medications and allergies reviewed with patient and updated if appropriate.  Patient Active Problem List   Diagnosis Date Noted  . Uncontrolled type 2 diabetes mellitus with hyperglycemia, without long-term current use of insulin ([place]) 09/09/2017  . Pancreatitis 03/03/2017  . Abnormal CXR 04/19/2016  . Upper airway cough syndrome likely related to allergic rhinitis  04/10/2016  . Essential hypertension, benign 03/05/2016  . Non-allergic rhinitis 06/13/2015  . Vitamin D deficiency 05/22/2015  . Hyperuricemia 03/24/2013  . Mixed hyperlipidemia 03/06/2008    Current Outpatient Medications on File Prior to Visit  Medication Sig Dispense Refill  . allopurinol (ZYLOPRIM) 300 MG tablet TAKE 1 TABLET (300 MG TOTAL) BY MOUTH DAILY. 90 tablet 1  . atorvastatin (LIPITOR) 10 MG tablet TAKE 1 TABLET BY MOUTH DAILY 90 tablet 1  . lisinopril (PRINIVIL,ZESTRIL) 40 MG tablet Take 40 mg by mouth daily.    Marland Kitchen losartan (COZAAR) 50 MG tablet Take 1 tablet (50 mg total) by mouth  daily. 90 tablet 1  . metFORMIN (GLUCOPHAGE) 500 MG tablet Take 1 tablet (500 mg total) by mouth 2 (two) times daily with a meal. 180 tablet 1  . Probiotic Product ([place]) 170 MG CAPS Taking one daily     No current facility-administered medications on file prior to visit.     Past Medical History:  Diagnosis Date  . Diabetes mellitus without complication ([place])   . Hyperlipidemia    NMR 05-2010:ldl 55(1415/711),HDL 48, TG 173. NO FH of MI.[place] study LDL goal  LDL goal =<160  . Hypertension   . Hyperuricemia     Past Surgical History:  Procedure Laterality Date  . BONE MARROW BIOPSY  2006   for elevated WBC  . CHOLECYSTECTOMY  03/16/2012   INTRAOPERATIVE CHOLANGIOGRAM;  Surgeon: Earnstine Regal, MD;  Location: [facility];  Service: General;  Laterality: N/A;  . G3  P1  A2    . laparascopic cholecystectomy  03/2012   Dr Harlow Asa    Social History   Socioeconomic History  . Marital status: Single    Spouse name: Not on file  . Number of children: 1  . Years of education: Not on file  . Highest education level: Not on file  Occupational History  . 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 Occupation: Best boy: [employer]  . Financial resource strain: Not on file  . Food insecurity:    Worry: Not on file    Inability: Not on  file  . Transportation needs:    Medical: Not on file    Non-medical: Not on file  Tobacco Use  . Smoking status: Never Smoker  . Smokeless tobacco: Never Used  Substance and Sexual Activity  . Alcohol use: No    Alcohol/week: 0.0 oz  . Drug use: No  . Sexual activity: Not on file  Lifestyle  . Physical activity:    Days per week: Not on file    Minutes per session: Not on file  . Stress: Not on file  Relationships  . Social connections:    Talks on phone: Not on file    Gets together: Not on file    Attends religious service: Not on file    Active member of club or organization:  Not on file    Attends meetings of clubs or organizations: Not on file    Relationship status: Not on file  Other Topics Concern  . Not on file  Social History Narrative  . Not on file    Family History  Problem Relation Age of Onset  . Stroke Paternal Grandmother 26  . Stroke Paternal Grandfather 63  . Hypertension Father   . Hypertension Mother   . Breast cancer Maternal Aunt   . Diabetes Neg Hx   . Heart disease Neg Hx     Review of Systems  Constitutional: Negative for chills and fever.  Eyes: Negative for visual disturbance.  Respiratory: Positive for cough (mild, weather change). Negative for shortness of breath and wheezing.   Cardiovascular: Negative for chest pain, palpitations and leg swelling.  Neurological: Negative for dizziness, light-headedness and headaches.       Objective:   Vitals:   03/10/18 0929  BP: 130/88  Pulse: 75  Resp: 16  Temp: 98 F (36.7 C)  SpO2: 98%   BP Readings from Last 3 Encounters:  03/10/18 130/88  09/09/17 128/84  03/03/17 124/86   Wt Readings from Last 3 Encounters:  03/10/18 147 lb (66.7 kg)  09/09/17 148 lb (67.1 kg)  03/03/17 147 lb (66.7 kg)   Body mass index is 26.89 kg/m.   Physical Exam    Constitutional: Appears well-developed and well-nourished. No distress.  HENT:  Head: Normocephalic and atraumatic.  Neck: Neck supple. No tracheal deviation present. No thyromegaly present.  No cervical lymphadenopathy Cardiovascular: Normal rate, regular rhythm and normal heart sounds.   No murmur heard. No carotid bruit .  No edema Pulmonary/Chest: Effort normal and breath sounds normal. No respiratory distress. No has no wheezes. No rales.  Skin: Skin is warm and dry. Not diaphoretic.  Psychiatric: Normal mood and affect. Behavior is normal.      Assessment & Plan:   Pneumovax pneumonia vaccine today   See Problem List for Assessment and Plan of chronic medical problems.

## 2018-03-10 ENCOUNTER — Encounter: Payer: Self-pay | Admitting: Internal Medicine

## 2018-03-10 ENCOUNTER — Ambulatory Visit: Payer: BLUE CROSS/BLUE SHIELD | Admitting: Internal Medicine

## 2018-03-10 VITALS — BP 130/88 | HR 75 | Temp 98.0°F | Resp 16 | Ht 62.0 in | Wt 147.0 lb

## 2018-03-10 DIAGNOSIS — E790 Hyperuricemia without signs of inflammatory arthritis and tophaceous disease: Secondary | ICD-10-CM

## 2018-03-10 DIAGNOSIS — I10 Essential (primary) hypertension: Secondary | ICD-10-CM | POA: Diagnosis not present

## 2018-03-10 DIAGNOSIS — Z23 Encounter for immunization: Secondary | ICD-10-CM

## 2018-03-10 DIAGNOSIS — E1165 Type 2 diabetes mellitus with hyperglycemia: Secondary | ICD-10-CM | POA: Diagnosis not present

## 2018-03-10 DIAGNOSIS — E782 Mixed hyperlipidemia: Secondary | ICD-10-CM | POA: Diagnosis not present

## 2018-03-10 MED ORDER — METFORMIN HCL 1000 MG PO TABS: 1000.0000 mg | ORAL_TABLET | Freq: Two times a day (BID) | ORAL | 3 refills | Status: DC

## 2018-03-10 NOTE — Assessment & Plan Note (Signed)
Uric acid level very well controlled Continue allopurinol at current dose

## 2018-03-10 NOTE — Assessment & Plan Note (Signed)
A1c 7.3%-not ideally controlled She is eating fairly healthy and her weight is stable, but not exercising regularly.  Stress is likely not helping as well Start regular exercise Ideally work on weight loss Metformin increased to 1000 mg twice daily Follow-up in 6 months

## 2018-03-10 NOTE — Assessment & Plan Note (Signed)
Lipid panel well controlled Continue atorvastatin

## 2018-03-10 NOTE — Assessment & Plan Note (Addendum)
Blood pressure borderline high here today Continue current medications Stressed the importance of regular exercise, ideally weight loss if possible Blood work from yesterday reviewed-CMP, CBC, TSH Follow-up in 6 months

## 2018-04-19 ENCOUNTER — Other Ambulatory Visit: Payer: Self-pay | Admitting: Internal Medicine

## 2018-05-10 ENCOUNTER — Other Ambulatory Visit: Payer: Self-pay | Admitting: Internal Medicine

## 2018-06-11 DIAGNOSIS — H6123 Impacted cerumen, bilateral: Secondary | ICD-10-CM | POA: Diagnosis not present

## 2018-07-05 DIAGNOSIS — Z124 Encounter for screening for malignant neoplasm of cervix: Secondary | ICD-10-CM | POA: Diagnosis not present

## 2018-07-05 LAB — HM PAP SMEAR: HM Pap smear: NEGATIVE

## 2018-08-13 IMAGING — CT CT ABD-PELV W/ CM
2 of 5 series · 15 of 46 positions shown, 17 images · IV contrast (iopamidol)
Comparison: CT of the abdomen and pelvis from 03/15/2012, and
abdominal radiograph performed earlier today at at [DATE] p.m.

CLINICAL DATA: Acute onset of epigastric abdominal pain, shortness
of breath and vomiting. Initial encounter.

EXAM:
CT ABDOMEN AND PELVIS WITH CONTRAST
TECHNIQUE: Multidetector CT imaging of the abdomen and pelvis was performed
using the standard protocol following bolus administration of
intravenous contrast.
CONTRAST:  100mL [id number] IOPAMIDOL ([id number]) INJECTION 61%

[Series 2: axial st · axial · 0.96mm/px · z∈[-442,-17]mm · 12 of 96 slices shown, 14 images]
[im 6/96  soft-tissue]
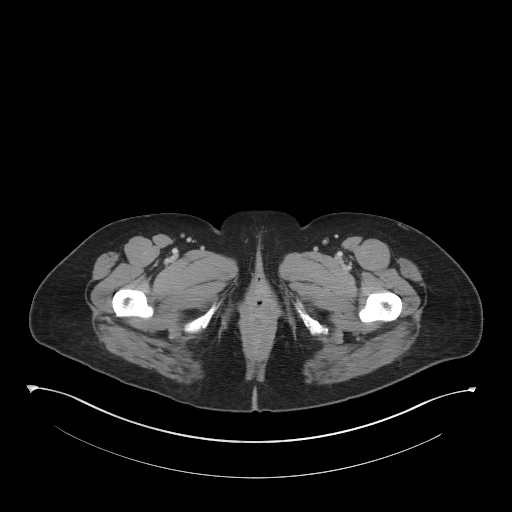
[im 6/96  bone]
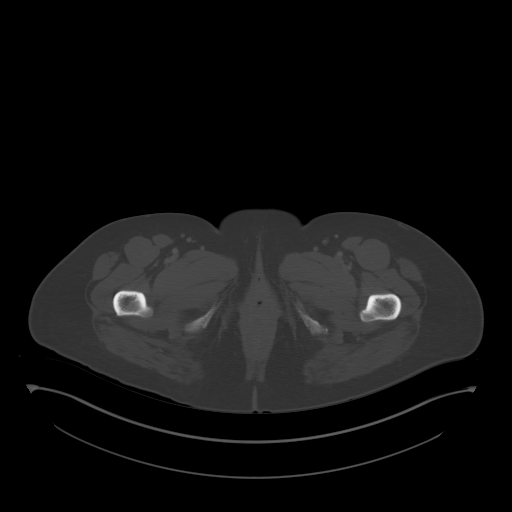
[im 16/96  soft-tissue]
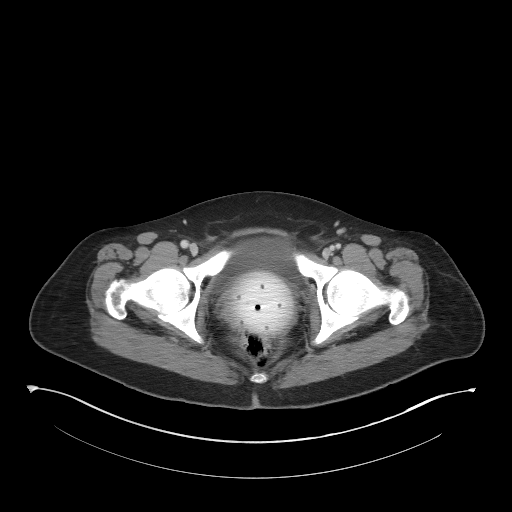
[im 21/96  soft-tissue]
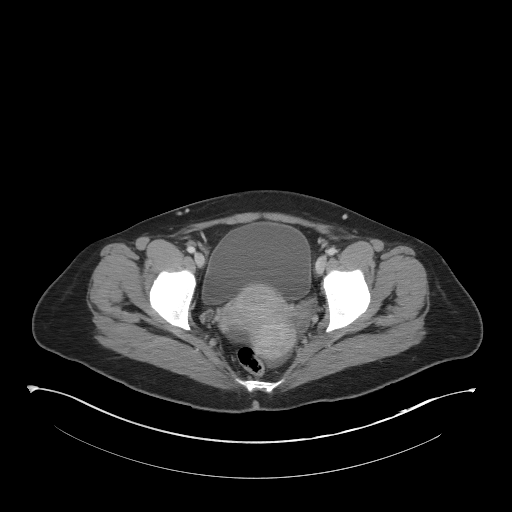
[im 31/96  soft-tissue]
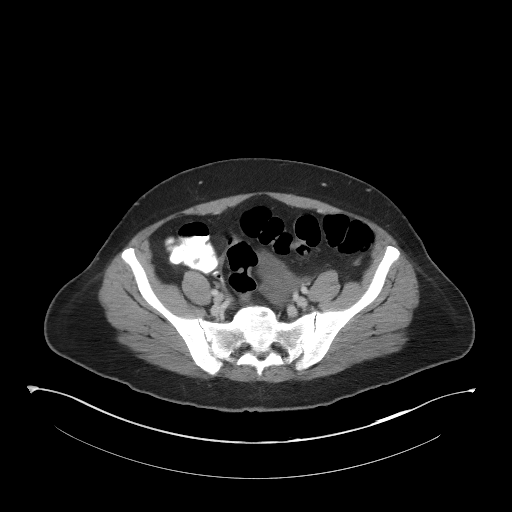
[im 36/96  soft-tissue]
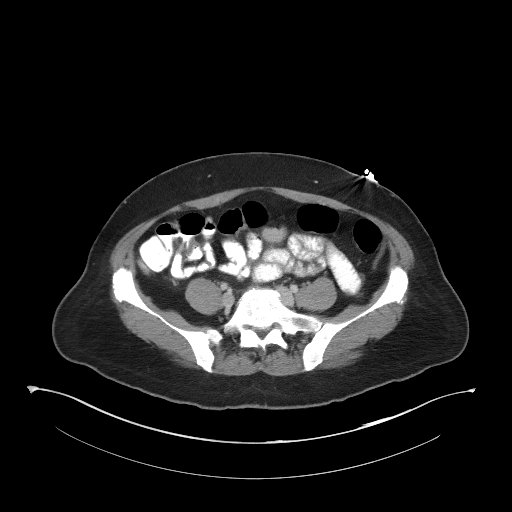
[im 46/96  soft-tissue]
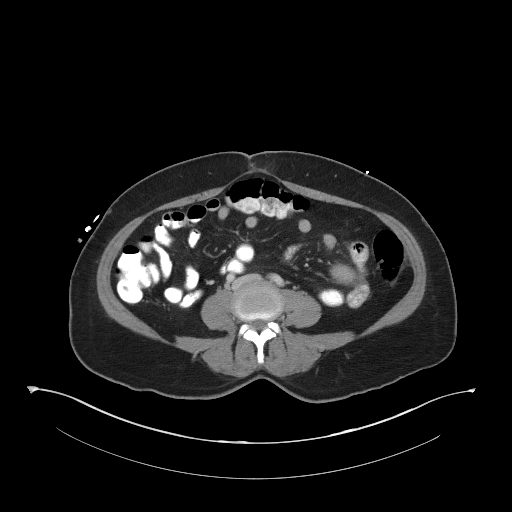
[im 51/96  soft-tissue]
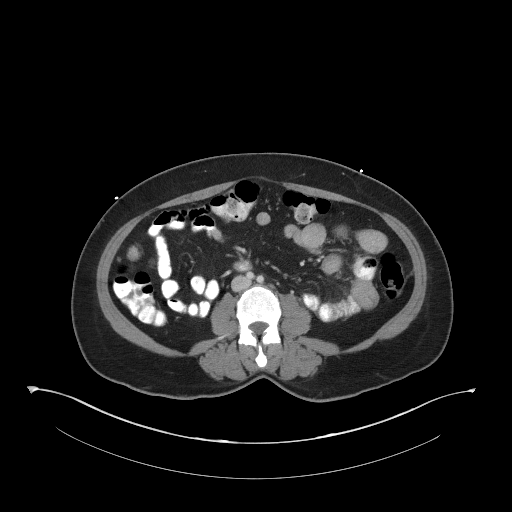
[im 61/96  soft-tissue]
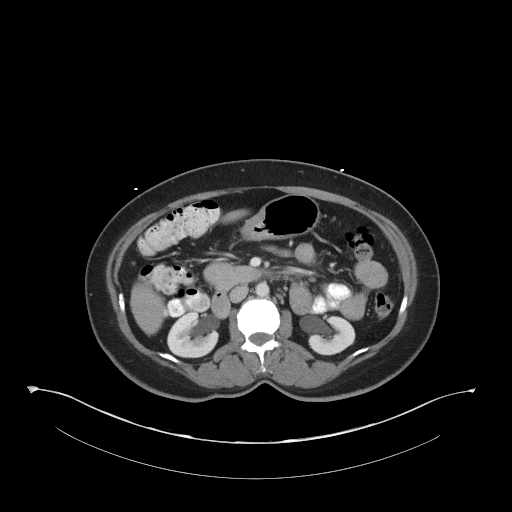
[im 66/96  soft-tissue]
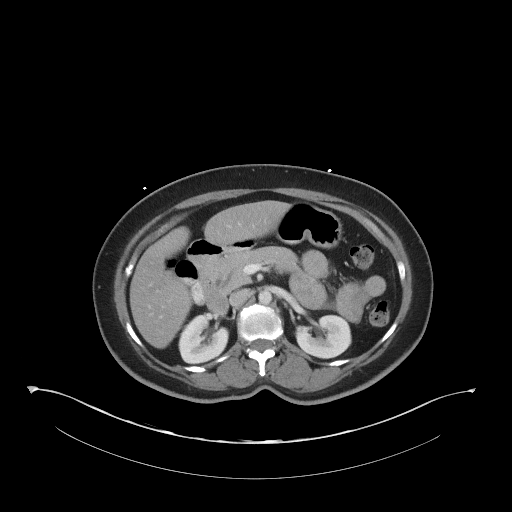
[im 66/96  bone]
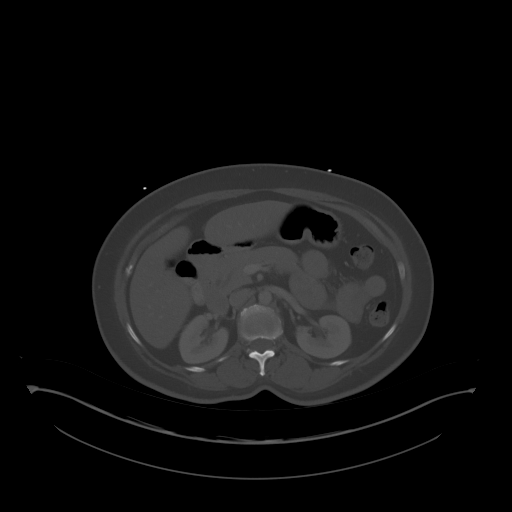
[im 76/96  soft-tissue]
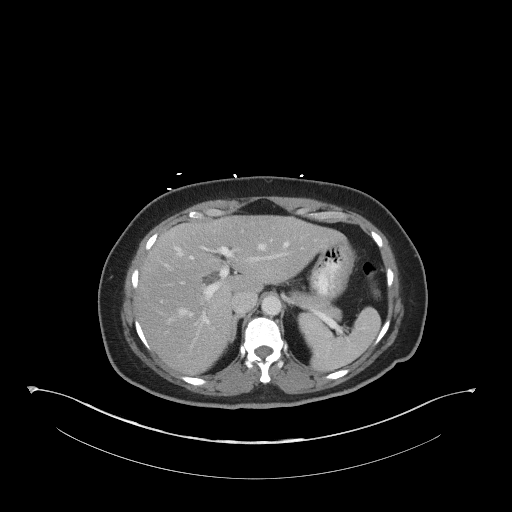
[im 81/96  soft-tissue]
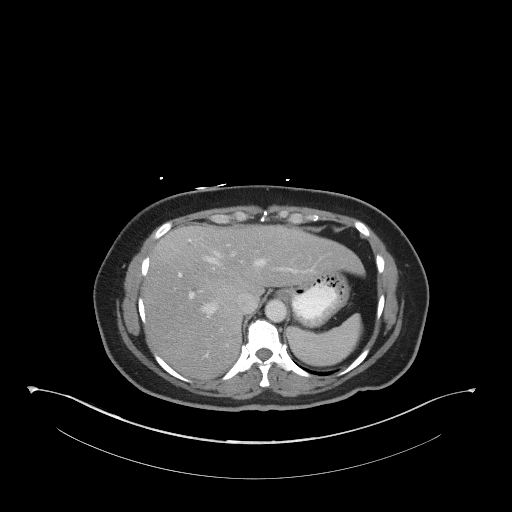
[im 91/96  soft-tissue]
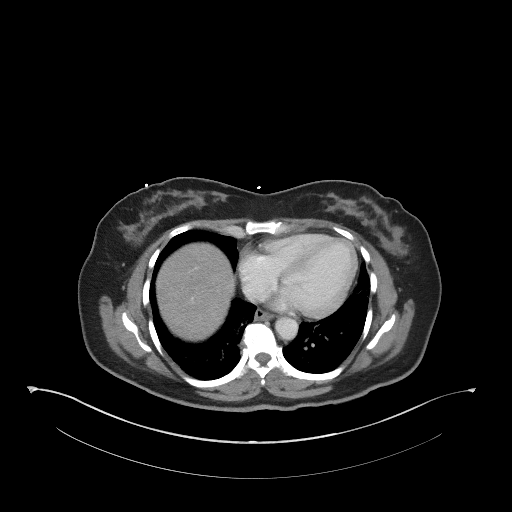

[Series 5: coronal st · coronal · 0.82mm/px · 3 of 82 slices shown]
[im 28/82  soft-tissue]
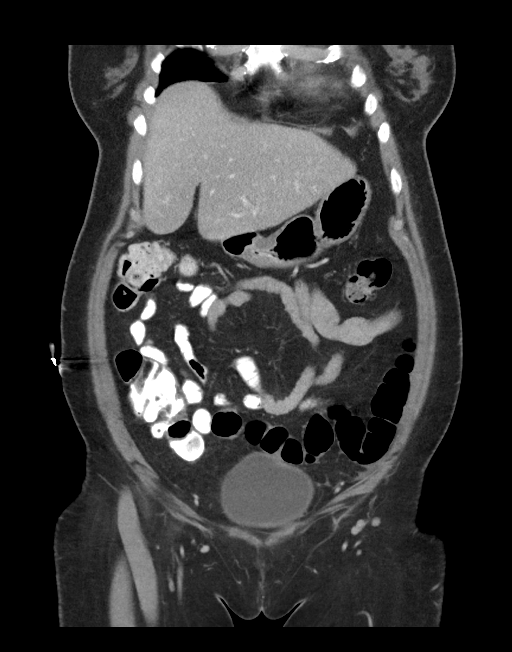
[im 37/82  soft-tissue]
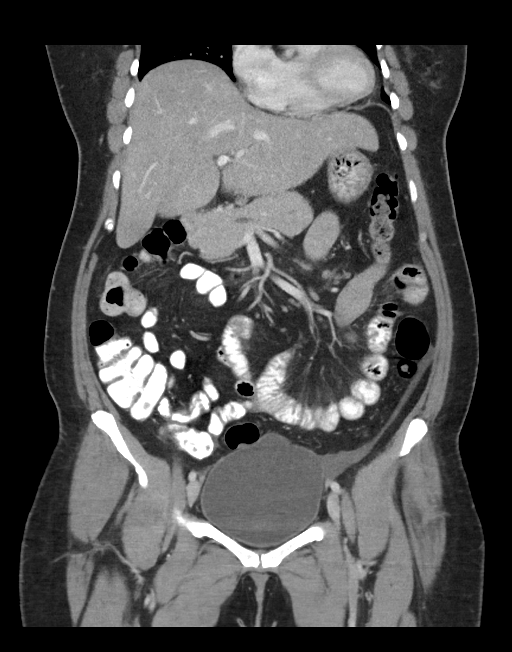
[im 46/82  soft-tissue]
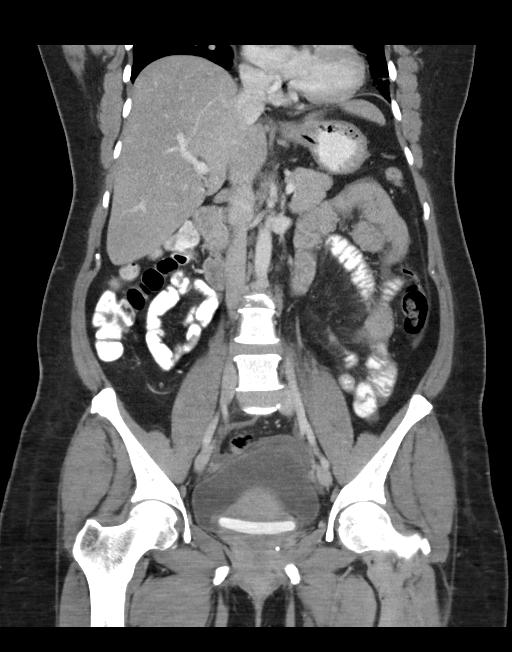

[15 of 46 positions shown; findings below may reference images not displayed]

FINDINGS: Lower chest: Minimal bibasilar atelectasis is noted. The visualized
portions of the mediastinum are unremarkable.

Hepatobiliary: The liver is unremarkable in appearance. The patient
is status post cholecystectomy, with clips noted at the gallbladder
fossa. The common bile duct remains normal in caliber.

Pancreas: The pancreas is within normal limits.

Spleen: The spleen is unremarkable in appearance.

Adrenals/Urinary Tract: The adrenal glands are unremarkable in
appearance. The kidneys are within normal limits. There is no
evidence of hydronephrosis. No renal or ureteral stones are
identified. No perinephric stranding is seen.

Stomach/Bowel: The stomach is unremarkable in appearance.

The appendix remains normal in caliber and contains trace contrast.
A small amount of fluid tracking about the appendix is thought to
reflect fluid tracking from the pelvis. There is no definite
evidence for appendicitis.

The colon is unremarkable in appearance. No significant small bowel
abnormalities are seen.

Vascular/Lymphatic: The abdominal aorta is unremarkable in
appearance. The inferior vena cava is grossly unremarkable. No
retroperitoneal lymphadenopathy is seen. No pelvic sidewall
lymphadenopathy is identified.

Reproductive: The bladder is mildly distended and grossly
unremarkable. The uterus is grossly unremarkable. A Gellhorn pessary
is noted in expected position. The ovaries are grossly unremarkable.
A small amount of free fluid within the pelvis, tracking to the
left, likely remains within physiologic limits, though a recently
ruptured cyst could have a similar appearance.

Other: A tiny periumbilical hernia is seen, containing only fat.

Musculoskeletal: No acute osseous abnormalities are identified. The
visualized musculature is unremarkable in appearance.
IMPRESSION: 1. No acute abnormality seen to explain the patient's symptoms.
2. Small amount of free fluid within the pelvis, tracking to the
left, likely remains within physiologic limits, though a recently
ruptured cyst could have a similar appearance.
3. Tiny periumbilical hernia, containing only fat.

## 2018-09-02 DIAGNOSIS — N3000 Acute cystitis without hematuria: Secondary | ICD-10-CM | POA: Diagnosis not present

## 2018-09-02 DIAGNOSIS — R300 Dysuria: Secondary | ICD-10-CM | POA: Diagnosis not present

## 2018-09-02 DIAGNOSIS — R3915 Urgency of urination: Secondary | ICD-10-CM | POA: Diagnosis not present

## 2018-09-02 DIAGNOSIS — R350 Frequency of micturition: Secondary | ICD-10-CM | POA: Diagnosis not present

## 2018-09-08 ENCOUNTER — Other Ambulatory Visit (INDEPENDENT_AMBULATORY_CARE_PROVIDER_SITE_OTHER): Payer: BLUE CROSS/BLUE SHIELD

## 2018-09-08 DIAGNOSIS — E1165 Type 2 diabetes mellitus with hyperglycemia: Secondary | ICD-10-CM

## 2018-09-08 DIAGNOSIS — E782 Mixed hyperlipidemia: Secondary | ICD-10-CM | POA: Diagnosis not present

## 2018-09-08 DIAGNOSIS — I10 Essential (primary) hypertension: Secondary | ICD-10-CM | POA: Diagnosis not present

## 2018-09-08 LAB — LIPID PANEL
Cholesterol: 160 mg/dL (ref 0–200)
HDL: 43.7 mg/dL (ref >39.00)
NonHDL: 116.5
Total CHOL/HDL Ratio: 4
Triglycerides: 289 mg/dL — ABNORMAL HIGH (ref 0.0–149.0)
VLDL: 57.8 mg/dL — ABNORMAL HIGH (ref 0.0–40.0)

## 2018-09-08 LAB — CBC WITH DIFFERENTIAL/PLATELET
Basophils Absolute: 0.1 10*3/uL (ref 0.0–0.1)
Basophils Relative: 1 % (ref 0.0–3.0)
Eosinophils Absolute: 0.2 10*3/uL (ref 0.0–0.7)
Eosinophils Relative: 1.7 % (ref 0.0–5.0)
HCT: 42.5 % (ref 36.0–46.0)
Hemoglobin: 14 g/dL (ref 12.0–15.0)
Lymphocytes Relative: 33.5 % (ref 12.0–46.0)
Lymphs Abs: 3.9 10*3/uL (ref 0.7–4.0)
MCHC: 32.9 g/dL (ref 30.0–36.0)
MCV: 88.2 fl (ref 78.0–100.0)
Monocytes Absolute: 0.6 10*3/uL (ref 0.1–1.0)
Monocytes Relative: 5.1 % (ref 3.0–12.0)
Neutro Abs: 6.8 10*3/uL (ref 1.4–7.7)
Neutrophils Relative %: 58.7 % (ref 43.0–77.0)
Platelets: 395 10*3/uL (ref 150.0–400.0)
RBC: 4.81 Mil/uL (ref 3.87–5.11)
RDW: 13.5 % (ref 11.5–15.5)
WBC: 11.6 10*3/uL — ABNORMAL HIGH (ref 4.0–10.5)

## 2018-09-08 LAB — COMPREHENSIVE METABOLIC PANEL
ALT: 55 U/L — ABNORMAL HIGH (ref 0–35)
AST: 31 U/L (ref 0–37)
Albumin: 4.5 g/dL (ref 3.5–5.2)
Alkaline Phosphatase: 86 U/L (ref 39–117)
BUN: 16 mg/dL (ref 6–23)
CO2: 23 mEq/L (ref 19–32)
Calcium: 9.9 mg/dL (ref 8.4–10.5)
Chloride: 103 mEq/L (ref 96–112)
Creatinine, Ser: 0.66 mg/dL (ref 0.40–1.20)
GFR: 99.31 mL/min (ref >60.00)
Glucose, Bld: 125 mg/dL — ABNORMAL HIGH (ref 70–99)
Potassium: 4.1 mEq/L (ref 3.5–5.1)
Sodium: 136 mEq/L (ref 135–145)
Total Bilirubin: 0.4 mg/dL (ref 0.2–1.2)
Total Protein: 8 g/dL (ref 6.0–8.3)

## 2018-09-08 LAB — LDL CHOLESTEROL, DIRECT: Direct LDL: 85 mg/dL

## 2018-09-08 LAB — HEMOGLOBIN A1C: Hgb A1c MFr Bld: 6.5 % (ref 4.6–6.5)

## 2018-09-08 LAB — TSH: TSH: 2.36 u[IU]/mL (ref 0.35–4.50)

## 2018-09-14 NOTE — Progress Notes (Signed)
Subjective:    Patient ID: Savannah Rogers, female    DOB: 1966/01/05, 52 y.o.   MRN: [id number]  HPI She is here for a physical exam.   She is rides a bicycle and walks some every day 15-30 minutes a day.    She monitors her blood pressure at home daily and it is [id number] systolically.  She has an area on her posterior neck that itches.  There is never been any redness or rash there when she is asked people to look.  She denies itching or rashes elsewhere.  She has tried regular lotions and Vaseline and they seem to help a little.  She has no other concerns.  Medications and allergies reviewed with patient and updated if appropriate.  Patient Active Problem List   Diagnosis Date Noted  . Uncontrolled type 2 diabetes mellitus with hyperglycemia, without long-term current use of insulin ([place]) 09/09/2017  . Pancreatitis 03/03/2017  . Abnormal CXR 04/19/2016  . Upper airway cough syndrome likely related to allergic rhinitis  04/10/2016  . Essential hypertension, benign 03/05/2016  . Non-allergic rhinitis 06/13/2015  . Vitamin D deficiency 05/22/2015  . Hyperuricemia 03/24/2013  . Mixed hyperlipidemia 03/06/2008    Current Outpatient Medications on File Prior to Visit  Medication Sig Dispense Refill  . allopurinol (ZYLOPRIM) 300 MG tablet TAKE 1 TABLET (300 MG TOTAL) BY MOUTH DAILY. 90 tablet 1  . atorvastatin (LIPITOR) 10 MG tablet TAKE 1 TABLET BY MOUTH DAILY 90 tablet 1  . losartan (COZAAR) 50 MG tablet TAKE 1 TABLET (50 MG TOTAL) BY MOUTH DAILY. 90 tablet 1  . metFORMIN (GLUCOPHAGE) 1000 MG tablet Take 1 tablet (1,000 mg total) by mouth 2 (two) times daily with a meal. 180 tablet 3  . Probiotic Product ([place]) 170 MG CAPS Taking one daily     No current facility-administered medications on file prior to visit.     Past Medical History:  Diagnosis Date  . Diabetes mellitus without complication ([place])   . Hyperlipidemia    NMR 05-2010:ldl 55(1415/711),HDL  48, TG 173. NO FH of MI.[place] study LDL goal  LDL goal =<160  . Hypertension   . Hyperuricemia     Past Surgical History:  Procedure Laterality Date  . BONE MARROW BIOPSY  2006   for elevated WBC  . CHOLECYSTECTOMY  03/16/2012   INTRAOPERATIVE CHOLANGIOGRAM;  Surgeon: Earnstine Regal, MD;  Location: [facility];  Service: General;  Laterality: N/A;  . G3  P1  A2    . laparascopic cholecystectomy  03/2012   Dr Harlow Asa    Social History   Socioeconomic History  . Marital status: Single    Spouse name: Not on file  . Number of children: 1  . Years of education: Not on file  . Highest education level: Not on file  Occupational History  . 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 Occupation: Best boy: [employer]  . Financial resource strain: Not on file  . Food insecurity:    Worry: Not on file    Inability: Not on file  . Transportation needs:    Medical: Not on file    Non-medical: Not on file  Tobacco Use  . Smoking status: Never Smoker  . Smokeless tobacco: Never Used  Substance and Sexual Activity  . Alcohol use: No    Alcohol/week: 0.0 standard drinks  . Drug use: No  . Sexual activity: Not  on file  Lifestyle  . Physical activity:    Days per week: Not on file    Minutes per session: Not on file  . Stress: Not on file  Relationships  . Social connections:    Talks on phone: Not on file    Gets together: Not on file    Attends religious service: Not on file    Active member of club or organization: Not on file    Attends meetings of clubs or organizations: Not on file    Relationship status: Not on file  Other Topics Concern  . Not on file  Social History Narrative  . Not on file    Family History  Problem Relation Age of Onset  . Stroke Paternal Grandmother 52  . Stroke Paternal Grandfather 35  . Hypertension Father   . Hypertension Mother   . Breast cancer Maternal Aunt   . Diabetes Neg Hx   . Heart disease  Neg Hx     Review of Systems  Constitutional: Negative for chills, fatigue and fever.  Eyes: Negative for visual disturbance.  Respiratory: Negative for cough, shortness of breath and wheezing.   Cardiovascular: Negative for chest pain, palpitations and leg swelling.  Gastrointestinal: Positive for diarrhea (sometimes). Negative for abdominal pain, blood in stool, constipation and nausea.       No gerd  Genitourinary: Negative for dysuria, frequency and hematuria.  Musculoskeletal: Positive for arthralgias (knees). Negative for back pain.  Skin: Negative for rash.       Itching posterior neck  Neurological: Negative for light-headedness and headaches.  Psychiatric/Behavioral: Negative for sleep disturbance.       Objective:   Vitals:   09/15/18 0856  BP: (!) 144/90  Pulse: 72  Resp: 16  Temp: 97.8 F (36.6 C)  SpO2: 98%   Filed Weights   09/15/18 0856  Weight: 147 lb 8 oz (66.9 kg)   Body mass index is 26.98 kg/m.  BP Readings from Last 3 Encounters:  09/15/18 (!) 144/90  03/10/18 130/88  09/09/17 128/84    Wt Readings from Last 3 Encounters:  09/15/18 147 lb 8 oz (66.9 kg)  03/10/18 147 lb (66.7 kg)  09/09/17 148 lb (67.1 kg)     Physical Exam Constitutional: She appears well-developed and well-nourished. No distress.  HENT:  Head: Normocephalic and atraumatic.  Right Ear: External ear normal. Normal ear canal and TM Left Ear: External ear normal.  Normal ear canal and TM Mouth/Throat: Oropharynx is clear and moist.  Eyes: Conjunctivae and EOM are normal.  Neck: Neck supple. No tracheal deviation present. No thyromegaly present.  No carotid bruit  Cardiovascular: Normal rate, regular rhythm and normal heart sounds.   No murmur heard.  No edema. Pulmonary/Chest: Effort normal and breath sounds normal. No respiratory distress. She has no wheezes. She has no rales.  Breast: deferred to Gyn Abdominal: Soft. She exhibits no distension. There is no  tenderness.  Lymphadenopathy: She has no cervical adenopathy.  Skin: Skin is warm and dry. She is not diaphoretic.  Dry area of skin posterior lower neck without erythema, papules-no breaks in skin Psychiatric: She has a normal mood and affect. Her behavior is normal.   Diabetic Foot Exam - Simple   Simple Foot Form Diabetic Foot exam was performed with the following findings:  Yes 09/15/2018  9:38 AM  Visual Inspection No deformities, no ulcerations, no other skin breakdown bilaterally:  Yes Sensation Testing Intact to touch and monofilament testing bilaterally:  Yes  Pulse Check Posterior Tibialis and Dorsalis pulse intact bilaterally:  Yes Comments         Assessment & Plan:   Physical exam: Screening blood work  reviewed Immunizations   Flu up to date, discussed shingrix Colonoscopy  Up to date  Mammogram   Up to date  Gyn  Up to date  Eye exams  Up to date  Exercise regular-daily Weight  Advised weight loss Skin  itching on posterior neck, no other concerns Substance abuse  none  See Problem List for Assessment and Plan of chronic medical problems.      FU in 6 months

## 2018-09-14 NOTE — Patient Instructions (Addendum)
Tests ordered today. Your results will be released to MyChart (or called to you) after review, usually within 72hours after test completion. If any changes need to be made, you will be notified at that same time.  All other Health Maintenance issues reviewed.   All recommended immunizations and age-appropriate screenings are up-to-date or discussed.  No immunizations administered today.   Medications reviewed and updated.  Changes include :   none    Please followup in 6 months   Health Maintenance, Female Adopting a healthy lifestyle and getting preventive care can go a long way to promote health and wellness. Talk with your health care provider about what schedule of regular examinations is right for you. This is a good chance for you to check in with your provider about disease prevention and staying healthy. In between checkups, there are plenty of things you can do on your own. Experts have done a lot of research about which lifestyle changes and preventive measures are most likely to keep you healthy. Ask your health care provider for more information. Weight and diet Eat a healthy diet  Be sure to include plenty of vegetables, fruits, low-fat dairy products, and lean protein.  Do not eat a lot of foods high in solid fats, added sugars, or salt.  Get regular exercise. This is one of the most important things you can do for your health. ? Most adults should exercise for at least 150 minutes each week. The exercise should increase your heart rate and make you sweat (moderate-intensity exercise). ? Most adults should also do strengthening exercises at least twice a week. This is in addition to the moderate-intensity exercise.  Maintain a healthy weight  Body mass index (BMI) is a measurement that can be used to identify possible weight problems. It estimates body fat based on height and weight. Your health care provider can help determine your BMI and help you achieve or maintain a  healthy weight.  For females 20 years of age and older: ? A BMI below 18.5 is considered underweight. ? A BMI of 18.5 to 24.9 is normal. ? A BMI of 25 to 29.9 is considered overweight. ? A BMI of 30 and above is considered obese.  Watch levels of cholesterol and blood lipids  You should start having your blood tested for lipids and cholesterol at 53 years of age, then have this test every 5 years.  You may need to have your cholesterol levels checked more often if: ? Your lipid or cholesterol levels are high. ? You are older than 53 years of age. ? You are at high risk for heart disease.  Cancer screening Lung Cancer  Lung cancer screening is recommended for adults 55-80 years old who are at high risk for lung cancer because of a history of smoking.  A yearly low-dose CT scan of the lungs is recommended for people who: ? Currently smoke. ? Have quit within the past 15 years. ? Have at least a 30-pack-year history of smoking. A pack year is smoking an average of one pack of cigarettes a day for 1 year.  Yearly screening should continue until it has been 15 years since you quit.  Yearly screening should stop if you develop a health problem that would prevent you from having lung cancer treatment.  Breast Cancer  Practice breast self-awareness. This means understanding how your breasts normally appear and feel.  It also means doing regular breast self-exams. Let your health care provider know about any   changes, no matter how small.  If you are in your 20s or 30s, you should have a clinical breast exam (CBE) by a health care provider every 1-3 years as part of a regular health exam.  If you are 40 or older, have a CBE every year. Also consider having a breast X-ray (mammogram) every year.  If you have a family history of breast cancer, talk to your health care provider about genetic screening.  If you are at high risk for breast cancer, talk to your health care provider about  having an MRI and a mammogram every year.  Breast cancer gene (BRCA) assessment is recommended for women who have family members with BRCA-related cancers. BRCA-related cancers include: ? Breast. ? Ovarian. ? Tubal. ? Peritoneal cancers.  Results of the assessment will determine the need for genetic counseling and BRCA1 and BRCA2 testing.  Cervical Cancer Your health care provider may recommend that you be screened regularly for cancer of the pelvic organs (ovaries, uterus, and vagina). This screening involves a pelvic examination, including checking for microscopic changes to the surface of your cervix (Pap test). You may be encouraged to have this screening done every 3 years, beginning at age 21.  For women ages 30-65, health care providers may recommend pelvic exams and Pap testing every 3 years, or they may recommend the Pap and pelvic exam, combined with testing for human papilloma virus (HPV), every 5 years. Some types of HPV increase your risk of cervical cancer. Testing for HPV may also be done on women of any age with unclear Pap test results.  Other health care providers may not recommend any screening for nonpregnant women who are considered low risk for pelvic cancer and who do not have symptoms. Ask your health care provider if a screening pelvic exam is right for you.  If you have had past treatment for cervical cancer or a condition that could lead to cancer, you need Pap tests and screening for cancer for at least 20 years after your treatment. If Pap tests have been discontinued, your risk factors (such as having a new sexual partner) need to be reassessed to determine if screening should resume. Some women have medical problems that increase the chance of getting cervical cancer. In these cases, your health care provider may recommend more frequent screening and Pap tests.  Colorectal Cancer  This type of cancer can be detected and often prevented.  Routine colorectal cancer  screening usually begins at 53 years of age and continues through 53 years of age.  Your health care provider may recommend screening at an earlier age if you have risk factors for colon cancer.  Your health care provider may also recommend using home test kits to check for hidden blood in the stool.  A small camera at the end of a tube can be used to examine your colon directly (sigmoidoscopy or colonoscopy). This is done to check for the earliest forms of colorectal cancer.  Routine screening usually begins at age 50.  Direct examination of the colon should be repeated every 5-10 years through 53 years of age. However, you may need to be screened more often if early forms of precancerous polyps or small growths are found.  Skin Cancer  Check your skin from head to toe regularly.  Tell your health care provider about any new moles or changes in moles, especially if there is a change in a mole's shape or color.  Also tell your health care provider if   you have a mole that is larger than the size of a pencil eraser.  Always use sunscreen. Apply sunscreen liberally and repeatedly throughout the day.  Protect yourself by wearing long sleeves, pants, a wide-brimmed hat, and sunglasses whenever you are outside.  Heart disease, diabetes, and high blood pressure  High blood pressure causes heart disease and increases the risk of stroke. High blood pressure is more likely to develop in: ? People who have blood pressure in the high end of the normal range (130-139/85-89 mm Hg). ? People who are overweight or obese. ? People who are African American.  If you are 18-39 years of age, have your blood pressure checked every 3-5 years. If you are 40 years of age or older, have your blood pressure checked every year. You should have your blood pressure measured twice-once when you are at a hospital or clinic, and once when you are not at a hospital or clinic. Record the average of the two measurements.  To check your blood pressure when you are not at a hospital or clinic, you can use: ? An automated blood pressure machine at a pharmacy. ? A home blood pressure monitor.  If you are between 55 years and 79 years old, ask your health care provider if you should take aspirin to prevent strokes.  Have regular diabetes screenings. This involves taking a blood sample to check your fasting blood sugar level. ? If you are at a normal weight and have a low risk for diabetes, have this test once every three years after 53 years of age. ? If you are overweight and have a high risk for diabetes, consider being tested at a younger age or more often. Preventing infection Hepatitis B  If you have a higher risk for hepatitis B, you should be screened for this virus. You are considered at high risk for hepatitis B if: ? You were born in a country where hepatitis B is common. Ask your health care provider which countries are considered high risk. ? Your parents were born in a high-risk country, and you have not been immunized against hepatitis B (hepatitis B vaccine). ? You have HIV or AIDS. ? You use needles to inject street drugs. ? You live with someone who has hepatitis B. ? You have had sex with someone who has hepatitis B. ? You get hemodialysis treatment. ? You take certain medicines for conditions, including cancer, organ transplantation, and autoimmune conditions.  Hepatitis C  Blood testing is recommended for: ? Everyone born from 1945 through 1965. ? Anyone with known risk factors for hepatitis C.  Sexually transmitted infections (STIs)  You should be screened for sexually transmitted infections (STIs) including gonorrhea and chlamydia if: ? You are sexually active and are younger than 53 years of age. ? You are older than 53 years of age and your health care provider tells you that you are at risk for this type of infection. ? Your sexual activity has changed since you were last screened  and you are at an increased risk for chlamydia or gonorrhea. Ask your health care provider if you are at risk.  If you do not have HIV, but are at risk, it may be recommended that you take a prescription medicine daily to prevent HIV infection. This is called pre-exposure prophylaxis (PrEP). You are considered at risk if: ? You are sexually active and do not regularly use condoms or know the HIV status of your partner(s). ? You take drugs by   You are sexually active with a partner who has HIV.  Talk with your health care provider about whether you are at high risk of being infected with HIV. If you choose to begin PrEP, you should first be tested for HIV. You should then be tested every 3 months for as long as you are taking PrEP. Pregnancy  If you are premenopausal and you may become pregnant, ask your health care provider about preconception counseling.  If you may become pregnant, take 400 to 800 micrograms (mcg) of folic acid every day.  If you want to prevent pregnancy, talk to your health care provider about birth control (contraception). Osteoporosis and menopause  Osteoporosis is a disease in which the bones lose minerals and strength with aging. This can result in serious bone fractures. Your risk for osteoporosis can be identified using a bone density scan.  If you are 18 years of age or older, or if you are at risk for osteoporosis and fractures, ask your health care provider if you should be screened.  Ask your health care provider whether you should take a calcium or vitamin D supplement to lower your risk for osteoporosis.  Menopause may have certain physical symptoms and risks.  Hormone replacement therapy may reduce some of these symptoms and risks. Talk to your health care provider about whether hormone replacement therapy is right for you. Follow these instructions at home:  Schedule regular health, dental, and eye exams.  Stay current with your  immunizations.  Do not use any tobacco products including cigarettes, chewing tobacco, or electronic cigarettes.  If you are pregnant, do not drink alcohol.  If you are breastfeeding, limit how much and how often you drink alcohol.  Limit alcohol intake to no more than 1 drink per day for nonpregnant women. One drink equals 12 ounces of beer, 5 ounces of wine, or 1 ounces of hard liquor.  Do not use street drugs.  Do not share needles.  Ask your health care provider for help if you need support or information about quitting drugs.  Tell your health care provider if you often feel depressed.  Tell your health care provider if you have ever been abused or do not feel safe at home. This information is not intended to replace advice given to you by your health care provider. Make sure you discuss any questions you have with your health care provider. Document Released: 06/02/2011 Document Revised: 04/24/2016 Document Reviewed: 08/21/2015 Elsevier Interactive Patient Education  [employer].

## 2018-09-15 ENCOUNTER — Encounter: Payer: Self-pay | Admitting: Internal Medicine

## 2018-09-15 ENCOUNTER — Ambulatory Visit (INDEPENDENT_AMBULATORY_CARE_PROVIDER_SITE_OTHER): Payer: BLUE CROSS/BLUE SHIELD | Admitting: Internal Medicine

## 2018-09-15 VITALS — BP 144/90 | HR 72 | Temp 97.8°F | Resp 16 | Ht 62.0 in | Wt 147.5 lb

## 2018-09-15 DIAGNOSIS — Z0000 Encounter for general adult medical examination without abnormal findings: Secondary | ICD-10-CM

## 2018-09-15 DIAGNOSIS — E790 Hyperuricemia without signs of inflammatory arthritis and tophaceous disease: Secondary | ICD-10-CM

## 2018-09-15 DIAGNOSIS — E1165 Type 2 diabetes mellitus with hyperglycemia: Secondary | ICD-10-CM

## 2018-09-15 DIAGNOSIS — E119 Type 2 diabetes mellitus without complications: Secondary | ICD-10-CM

## 2018-09-15 DIAGNOSIS — E782 Mixed hyperlipidemia: Secondary | ICD-10-CM | POA: Diagnosis not present

## 2018-09-15 DIAGNOSIS — I10 Essential (primary) hypertension: Secondary | ICD-10-CM | POA: Diagnosis not present

## 2018-09-15 NOTE — Assessment & Plan Note (Signed)
Blood pressure slightly elevated here today, but is well controlled at home Continue current medication at current dose Continue to monitor at home daily

## 2018-09-15 NOTE — Assessment & Plan Note (Signed)
LDL well controlled, triglycerides elevated Continue daily statin Regular exercise and healthy diet encouraged Encouraged weight loss

## 2018-09-15 NOTE — Assessment & Plan Note (Signed)
No gout symptoms Continue allopurinol 300 mg daily Check uric acid level next visit

## 2018-09-15 NOTE — Assessment & Plan Note (Signed)
Sugars better controlled-A1c is 6.5% Continue metformin at current dose Continue regular exercise and diabetic diet Encouraged weight loss Follow-up in 6 months

## 2018-10-29 ENCOUNTER — Other Ambulatory Visit: Payer: Self-pay | Admitting: Internal Medicine

## 2018-12-08 ENCOUNTER — Encounter: Payer: Self-pay | Admitting: Internal Medicine

## 2018-12-09 DIAGNOSIS — E119 Type 2 diabetes mellitus without complications: Secondary | ICD-10-CM | POA: Diagnosis not present

## 2018-12-09 LAB — HM DIABETES EYE EXAM

## 2018-12-09 NOTE — Telephone Encounter (Signed)
Think she wants to be referred to surgery for her hernia.  Just need to know what type of hernia it is-I do not recall.

## 2018-12-10 NOTE — Telephone Encounter (Signed)
Spoke with pt and advise that she come in for an office visit to discuss further.

## 2018-12-12 NOTE — Progress Notes (Signed)
Subjective:    Patient ID: Savannah Rogers, female    DOB: [birth year], 53 y.o.   MRN: [id number]  HPI The patient is here for an acute visit.   Umbilical hernia:  It has been there 4 years.  It is intermittently painful.  She denies pain right now.  She is interested in talking with a surgeon and would consider repair.    GERD:  For one week she has had nausea and a fullness in the epigastric region.  She has had GERD and states decreased taste - everything tastes sour.  She has abdominal distension and increased burping.  She takes advil as needed.  She denies any changes in diet.  She feels like her abdomen has gotten larger.    Medications and allergies reviewed with patient and updated if appropriate.  Patient Active Problem List   Diagnosis Date Noted  . Diabetes ([place]) 09/09/2017  . Pancreatitis 03/03/2017  . Abnormal CXR 04/19/2016  . Upper airway cough syndrome likely related to allergic rhinitis  04/10/2016  . Essential hypertension, benign 03/05/2016  . Non-allergic rhinitis 06/13/2015  . Vitamin D deficiency 05/22/2015  . Hyperuricemia 03/24/2013  . Mixed hyperlipidemia 03/06/2008    Current Outpatient Medications on File Prior to Visit  Medication Sig Dispense Refill  . allopurinol (ZYLOPRIM) 300 MG tablet TAKE 1 TABLET (300 MG TOTAL) BY MOUTH DAILY. 90 tablet 1  . atorvastatin (LIPITOR) 10 MG tablet TAKE 1 TABLET BY MOUTH DAILY 90 tablet 1  . losartan (COZAAR) 50 MG tablet TAKE 1 TABLET (50 MG TOTAL) BY MOUTH DAILY. 90 tablet 1  . metFORMIN (GLUCOPHAGE) 1000 MG tablet Take 1 tablet (1,000 mg total) by mouth 2 (two) times daily with a meal. 180 tablet 3  . Probiotic Product ([place]) 170 MG CAPS Taking one daily     No current facility-administered medications on file prior to visit.     Past Medical History:  Diagnosis Date  . Diabetes mellitus without complication ([place])   . Hyperlipidemia    NMR 05-2010:ldl 55(1415/711),HDL 48, TG 173. NO FH of  MI.[place] study LDL goal  LDL goal =<160  . Hypertension   . Hyperuricemia     Past Surgical History:  Procedure Laterality Date  . BONE MARROW BIOPSY  2006   for elevated WBC  . CHOLECYSTECTOMY  03/16/2012   INTRAOPERATIVE CHOLANGIOGRAM;  Surgeon: Earnstine Regal, MD;  Location: [facility];  Service: General;  Laterality: N/A;  . G3  P1  A2    . laparascopic cholecystectomy  03/2012   Dr Harlow Asa    Social History   Socioeconomic History  . Marital status: Single    Spouse name: Not on file  . Number of children: 1  . Years of education: Not on file  . Highest education level: Not on file  Occupational History  . 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 Occupation: Best boy: [employer]  . Financial resource strain: Not on file  . Food insecurity:    Worry: Not on file    Inability: Not on file  . Transportation needs:    Medical: Not on file    Non-medical: Not on file  Tobacco Use  . Smoking status: Never Smoker  . Smokeless tobacco: Never Used  Substance and Sexual Activity  . Alcohol use: No    Alcohol/week: 0.0 standard drinks  . Drug use: No  . Sexual activity: Not  on file  Lifestyle  . Physical activity:    Days per week: Not on file    Minutes per session: Not on file  . Stress: Not on file  Relationships  . Social connections:    Talks on phone: Not on file    Gets together: Not on file    Attends religious service: Not on file    Active member of club or organization: Not on file    Attends meetings of clubs or organizations: Not on file    Relationship status: Not on file  Other Topics Concern  . Not on file  Social History Narrative  . Not on file    Family History  Problem Relation Age of Onset  . Stroke Paternal Grandmother 80  . Stroke Paternal Grandfather 33  . Hypertension Father   . Hypertension Mother   . Breast cancer Maternal Aunt   . Diabetes Neg Hx   . Heart disease Neg Hx     Review  of Systems  Constitutional: Positive for chills. Negative for fatigue and fever.       Temperature goes up and down  Cardiovascular: Negative for chest pain.  Gastrointestinal: Positive for abdominal distention (with gas), abdominal pain, diarrhea (sometimes) and nausea. Negative for blood in stool and constipation.       Gerd, inc burping       Objective:   Vitals:   12/14/18 0915  BP: 124/84  Pulse: 76  Resp: 16  Temp: 98.1 F (36.7 C)  SpO2: 98%   BP Readings from Last 3 Encounters:  12/14/18 124/84  09/15/18 (!) 144/90  03/10/18 130/88   Wt Readings from Last 3 Encounters:  12/14/18 148 lb 12.8 oz (67.5 kg)  09/15/18 147 lb 8 oz (66.9 kg)  03/10/18 147 lb (66.7 kg)   Body mass index is 27.22 kg/m.   Physical Exam Constitutional:      General: She is not in acute distress.    Appearance: Normal appearance. She is not ill-appearing.  HENT:     Head: Normocephalic and atraumatic.  Abdominal:     General: There is no distension.     Palpations: Abdomen is soft.     Tenderness: There is abdominal tenderness (minimal epigastric region). There is no guarding or rebound.     Hernia: A hernia (umbilical, reducible) is present.  Skin:    General: Skin is warm and dry.     Findings: No erythema.  Neurological:     Mental Status: She is alert.            Assessment & Plan:    See Problem List for Assessment and Plan of chronic medical problems.

## 2018-12-14 ENCOUNTER — Other Ambulatory Visit: Payer: Self-pay | Admitting: Internal Medicine

## 2018-12-14 ENCOUNTER — Encounter: Payer: Self-pay | Admitting: Internal Medicine

## 2018-12-14 ENCOUNTER — Ambulatory Visit (INDEPENDENT_AMBULATORY_CARE_PROVIDER_SITE_OTHER): Payer: BLUE CROSS/BLUE SHIELD | Admitting: Internal Medicine

## 2018-12-14 VITALS — BP 124/84 | HR 76 | Temp 98.1°F | Resp 16 | Ht 62.0 in | Wt 148.8 lb

## 2018-12-14 DIAGNOSIS — K219 Gastro-esophageal reflux disease without esophagitis: Secondary | ICD-10-CM | POA: Insufficient documentation

## 2018-12-14 DIAGNOSIS — K429 Umbilical hernia without obstruction or gangrene: Secondary | ICD-10-CM

## 2018-12-14 MED ORDER — OMEPRAZOLE 20 MG PO CPDR: 20.0000 mg | DELAYED_RELEASE_CAPSULE | Freq: Every day | ORAL | 0 refills | Status: DC

## 2018-12-14 MED ORDER — FAMOTIDINE 40 MG PO TABS: 40.0000 mg | ORAL_TABLET | Freq: Every day | ORAL | 5 refills | Status: DC

## 2018-12-14 NOTE — Assessment & Plan Note (Signed)
Having intermittent pain Will refer to surgery - may consider surgical repair

## 2018-12-14 NOTE — Assessment & Plan Note (Signed)
New Present for one week Decrease nsaids, handout given regarding GERD diet Work on weight loss Start omeprazole 20 mg daily x 2 weeks and pepcid 40 mg daily  - continue pepcid - once controlled can take pepcid as needed only

## 2018-12-14 NOTE — Patient Instructions (Addendum)
A referral was ordered for surgery for your hernia.   For your heartburn/reflux take omeprazole for 2 weeks ago - take 30 minutes before a meal.  Take pepcid once a day - you can take that at anytime.  Continue the pepcid as long as your need it.  Once your heartburn is controlled you can take the pepcid only as needed.       Gastroesophageal Reflux Disease, Adult Gastroesophageal reflux (GER) happens when acid from the stomach flows up into the tube that connects the mouth and the stomach (esophagus). Normally, food travels down the esophagus and stays in the stomach to be digested. However, when a person has GER, food and stomach acid sometimes move back up into the esophagus. If this becomes a more serious problem, the person may be diagnosed with a disease called gastroesophageal reflux disease (GERD). GERD occurs when the reflux:  Happens often.  Causes frequent or severe symptoms.  Causes problems such as damage to the esophagus. When stomach acid comes in contact with the esophagus, the acid may cause soreness (inflammation) in the esophagus. Over time, GERD may create small holes (ulcers) in the lining of the esophagus. What are the causes? This condition is caused by a problem with the muscle between the esophagus and the stomach (lower esophageal sphincter, or LES). Normally, the LES muscle closes after food passes through the esophagus to the stomach. When the LES is weakened or abnormal, it does not close properly, and that allows food and stomach acid to go back up into the esophagus. The LES can be weakened by certain dietary substances, medicines, and medical conditions, including:  Tobacco use.  Pregnancy.  Having a hiatal hernia.  Alcohol use.  Certain foods and beverages, such as coffee, chocolate, onions, and peppermint. What increases the risk? You are more likely to develop this condition if you:  Have an increased body weight.  Have a connective tissue  disorder.  Use NSAID medicines. What are the signs or symptoms? Symptoms of this condition include:  Heartburn.  Difficult or painful swallowing.  The feeling of having a lump in the throat.  Abitter taste in the mouth.  Bad breath.  Having a large amount of saliva.  Having an upset or bloated stomach.  Belching.  Chest pain. Different conditions can cause chest pain. Make sure you see your health care provider if you experience chest pain.  Shortness of breath or wheezing.  Ongoing (chronic) cough or a night-time cough.  Wearing away of tooth enamel.  Weight loss. How is this diagnosed? Your health care provider will take a medical history and perform a physical exam. To determine if you have mild or severe GERD, your health care provider may also monitor how you respond to treatment. You may also have tests, including:  A test to examine your stomach and esophagus with a small camera (endoscopy).  A test thatmeasures the acidity level in your esophagus.  A test thatmeasures how much pressure is on your esophagus.  A barium swallow or modified barium swallow test to show the shape, size, and functioning of your esophagus. How is this treated? The goal of treatment is to help relieve your symptoms and to prevent complications. Treatment for this condition may vary depending on how severe your symptoms are. Your health care provider may recommend:  Changes to your diet.  Medicine.  Surgery. Follow these instructions at home: Eating and drinking   Follow a diet as recommended by your health care provider.  This may involve avoiding foods and drinks such as: ? Coffee and tea (with or without caffeine). ? Drinks that containalcohol. ? Energy drinks and sports drinks. ? Carbonated drinks or sodas. ? Chocolate and cocoa. ? Peppermint and mint flavorings. ? Garlic and onions. ? Horseradish. ? Spicy and acidic foods, including peppers, chili powder, curry  powder, vinegar, hot sauces, and barbecue sauce. ? Citrus fruit juices and citrus fruits, such as oranges, lemons, and limes. ? Tomato-based foods, such as red sauce, chili, salsa, and pizza with red sauce. ? Fried and fatty foods, such as donuts, french fries, potato chips, and high-fat dressings. ? High-fat meats, such as hot dogs and fatty cuts of red and white meats, such as rib eye steak, sausage, ham, and bacon. ? High-fat dairy items, such as whole milk, butter, and cream cheese.  Eat small, frequent meals instead of large meals.  Avoid drinking large amounts of liquid with your meals.  Avoid eating meals during the 2-3 hours before bedtime.  Avoid lying down right after you eat.  Do not exercise right after you eat. Lifestyle   Do not use any products that contain nicotine or tobacco, such as cigarettes, e-cigarettes, and chewing tobacco. If you need help quitting, ask your health care provider.  Try to reduce your stress by using methods such as yoga or meditation. If you need help reducing stress, ask your health care provider.  If you are overweight, reduce your weight to an amount that is healthy for you. Ask your health care provider for guidance about a safe weight loss goal. General instructions  Pay attention to any changes in your symptoms.  Take over-the-counter and prescription medicines only as told by your health care provider. Do not take aspirin, ibuprofen, or other NSAIDs unless your health care provider told you to do so.  Wear loose-fitting clothing. Do not wear anything tight around your waist that causes pressure on your abdomen.  Raise (elevate) the head of your bed about 6 inches (15 cm).  Avoid bending over if this makes your symptoms worse.  Keep all follow-up visits as told by your health care provider. This is important. Contact a health care provider if:  You have: ? New symptoms. ? Unexplained weight loss. ? Difficulty swallowing or it  hurts to swallow. ? Wheezing or a persistent cough. ? A hoarse voice.  Your symptoms do not improve with treatment. Get help right away if you:  Have pain in your arms, neck, jaw, teeth, or back.  Feel sweaty, dizzy, or light-headed.  Have chest pain or shortness of breath.  Vomit and your vomit looks like blood or coffee grounds.  Faint.  Have stool that is bloody or black.  Cannot swallow, drink, or eat. Summary  Gastroesophageal reflux happens when acid from the stomach flows up into the esophagus. GERD is a disease in which the reflux happens often, causes frequent or severe symptoms, or causes problems such as damage to the esophagus.  Treatment for this condition may vary depending on how severe your symptoms are. Your health care provider may recommend diet and lifestyle changes, medicine, or surgery.  Contact a health care provider if you have new or worsening symptoms.  Take over-the-counter and prescription medicines only as told by your health care provider. Do not take aspirin, ibuprofen, or other NSAIDs unless your health care provider told you to do so.  Keep all follow-up visits as told by your health care provider. This is important. This information  is not intended to replace advice given to you by your health care provider. Make sure you discuss any questions you have with your health care provider. Document Released: 08/27/2005 Document Revised: 05/26/2018 Document Reviewed: 05/26/2018 Elsevier Interactive Patient Education  2019 [employer].

## 2018-12-21 ENCOUNTER — Encounter: Payer: Self-pay | Admitting: Internal Medicine

## 2019-01-05 ENCOUNTER — Encounter: Payer: Self-pay | Admitting: Internal Medicine

## 2019-01-14 DIAGNOSIS — K432 Incisional hernia without obstruction or gangrene: Secondary | ICD-10-CM | POA: Diagnosis not present

## 2019-02-09 ENCOUNTER — Encounter: Payer: Self-pay | Admitting: Internal Medicine

## 2019-02-11 ENCOUNTER — Telehealth: Payer: Self-pay | Admitting: Internal Medicine

## 2019-02-11 MED ORDER — TELMISARTAN 40 MG PO TABS: 40.0000 mg | ORAL_TABLET | Freq: Every day | ORAL | 5 refills | Status: DC

## 2019-02-11 NOTE — Telephone Encounter (Signed)
Pt aware.

## 2019-02-11 NOTE — Telephone Encounter (Signed)
Please advise 

## 2019-02-11 NOTE — Telephone Encounter (Signed)
Copied from CRM [id number]. Topic: General - Other >> Feb 11, 2019  9:22 AM Leafy Ro wrote: Reason for CRM: pt [facility] other [facility] and losartan is not available. This med has been on backorder for over 4 months. Pt will needs another  BP  [facility] [address] Pt has taken her last bp med losartan

## 2019-02-11 NOTE — Telephone Encounter (Signed)
telmisartan was sent - this is the same type of medication

## 2019-02-27 ENCOUNTER — Other Ambulatory Visit: Payer: Self-pay | Admitting: Internal Medicine

## 2019-03-21 NOTE — Progress Notes (Signed)
Virtual Visit via Video Note  I connected with Savannah Rogers on 03/21/19 at  3:00 PM EDT by a video enabled telemedicine application and verified that I am speaking with the correct person using two identifiers.   I discussed the limitations of evaluation and management by telemedicine and the availability of in person appointments. The patient expressed understanding and agreed to proceed.  The patient is currently at home and I am in the office.    No referring provider.    History of Present Illness: She is here for follow up of her chronic medical conditions.   She is exercising regularly - 30 minutes most days.    Diabetes: She is taking her medication daily as prescribed. She is compliant with a diabetic diet.  She monitors her sugars and they have been running 120. She checks her feet daily and denies foot lesions. She denies numbness/tingling in her feet.  She is up-to-date with an ophthalmology examination.   Hypertension: She is taking her medication daily. She is compliant with a low sodium diet.  She denies chest pain, palpitations, edema, shortness of breath and regular headaches. She does monitor her blood pressure at home.    Hyperlipidemia: She is taking her medication daily. She is compliant with a low fat/cholesterol diet.   Hyperuricemia:  She is taking allopuinol daily.  She denies gout symptoms.   She does not always sleep well.  She does get tired in the afternoon.  She goes to sleep at 11 or 12am and will wake up a few times a night.    Review of Systems  Constitutional: Positive for malaise/fatigue. Negative for chills and fever.  Respiratory: Negative for cough, shortness of breath and wheezing.   Cardiovascular: Negative for chest pain, palpitations and leg swelling.  Gastrointestinal: Positive for nausea (occ, transient).  Neurological: Negative for dizziness, tingling and headaches.     Social History   Socioeconomic History  . Marital status: Single     Spouse name: Not on file  . Number of children: 1  . Years of education: Not on file  . Highest education level: Not on file  Occupational History  . Occupation:  Copywriter, advertisingHOTEL MANAGER    Employer: [employer]  . Occupation: Event organiserMANAGER    Employer: [employer]  Social Needs  . Financial resource strain: Not on file  . Food insecurity:    Worry: Not on file    Inability: Not on file  . Transportation needs:    Medical: Not on file    Non-medical: Not on file  Tobacco Use  . Smoking status: Never Smoker  . Smokeless tobacco: Never Used  Substance and Sexual Activity  . Alcohol use: No    Alcohol/week: 0.0 standard drinks  . Drug use: No  . Sexual activity: Not on file  Lifestyle  . Physical activity:    Days per week: Not on file    Minutes per session: Not on file  . Stress: Not on file  Relationships  . Social connections:    Talks on phone: Not on file    Gets together: Not on file    Attends religious service: Not on file    Active member of club or organization: Not on file    Attends meetings of clubs or organizations: Not on file    Relationship status: Not on file  Other Topics Concern  . Not on file  Social History Narrative  . Not on file     Observations/Objective:  Appears well in NAD Normal mood and affect  Home vitals:  BP  134/81,  Sugars 120,   Wt 144.6  Assessment and Plan:  See Problem List for Assessment and Plan of chronic medical problems.   Follow Up Instructions:    I discussed the assessment and treatment plan with the patient. The patient was provided an opportunity to ask questions and all were answered. The patient agreed with the plan and demonstrated an understanding of the instructions.   The patient was advised to call back or seek an in-person evaluation if the symptoms worsen or if the condition fails to improve as anticipated.  FU with me in 6 months, blood work over the summer  Pincus Sanes, MD

## 2019-03-22 ENCOUNTER — Ambulatory Visit (INDEPENDENT_AMBULATORY_CARE_PROVIDER_SITE_OTHER): Payer: BLUE CROSS/BLUE SHIELD | Admitting: Internal Medicine

## 2019-03-22 ENCOUNTER — Encounter: Payer: Self-pay | Admitting: Internal Medicine

## 2019-03-22 DIAGNOSIS — E119 Type 2 diabetes mellitus without complications: Secondary | ICD-10-CM | POA: Diagnosis not present

## 2019-03-22 DIAGNOSIS — E790 Hyperuricemia without signs of inflammatory arthritis and tophaceous disease: Secondary | ICD-10-CM | POA: Diagnosis not present

## 2019-03-22 DIAGNOSIS — I10 Essential (primary) hypertension: Secondary | ICD-10-CM | POA: Diagnosis not present

## 2019-03-22 DIAGNOSIS — E782 Mixed hyperlipidemia: Secondary | ICD-10-CM | POA: Diagnosis not present

## 2019-03-22 DIAGNOSIS — R5383 Other fatigue: Secondary | ICD-10-CM

## 2019-03-22 NOTE — Assessment & Plan Note (Signed)
Lab Results  Component Value Date   HGBA1C 6.5 09/08/2018     Sugars around 120 at home Well controlled  Continue current medication

## 2019-03-22 NOTE — Assessment & Plan Note (Signed)
She gets tired in the afternoon Blood work at last visit normal May be from poor quality sleep - wakes a few times and does not get enough Advised she can try melatonin

## 2019-03-22 NOTE — Assessment & Plan Note (Signed)
BP well controlled Current regimen effective and well tolerated Continue current medications at current doses  

## 2019-03-22 NOTE — Assessment & Plan Note (Signed)
Continue statin. 

## 2019-03-22 NOTE — Assessment & Plan Note (Signed)
No gout symptoms Continue allopurinol 

## 2019-05-28 ENCOUNTER — Other Ambulatory Visit: Payer: Self-pay | Admitting: Internal Medicine

## 2019-06-03 ENCOUNTER — Other Ambulatory Visit: Payer: Self-pay | Admitting: Internal Medicine

## 2019-06-29 ENCOUNTER — Other Ambulatory Visit: Payer: Self-pay | Admitting: Internal Medicine

## 2019-08-23 ENCOUNTER — Other Ambulatory Visit: Payer: Self-pay | Admitting: Internal Medicine

## 2019-09-18 NOTE — Patient Instructions (Addendum)
Tests ordered today. Your results will be released to MyChart (or called to you) after review.  If any changes need to be made, you will be notified at that same time.  All other Health Maintenance issues reviewed.   All recommended immunizations and age-appropriate screenings are up-to-date or discussed.  Flu immunization administered today.    Medications reviewed and updated.  Changes include :   none  Your prescription(s) have been submitted to your pharmacy. Please take as directed and contact our office if you believe you are having problem(s) with the medication(s).   Please followup in 6 months     Health Maintenance, Female Adopting a healthy lifestyle and getting preventive care are important in promoting health and wellness. Ask your health care provider about:  The right schedule for you to have regular tests and exams.  Things you can do on your own to prevent diseases and keep yourself healthy. What should I know about diet, weight, and exercise? Eat a healthy diet   Eat a diet that includes plenty of vegetables, fruits, low-fat dairy products, and lean protein.  Do not eat a lot of foods that are high in solid fats, added sugars, or sodium. Maintain a healthy weight Body mass index (BMI) is used to identify weight problems. It estimates body fat based on height and weight. Your health care provider can help determine your BMI and help you achieve or maintain a healthy weight. Get regular exercise Get regular exercise. This is one of the most important things you can do for your health. Most adults should:  Exercise for at least 150 minutes each week. The exercise should increase your heart rate and make you sweat (moderate-intensity exercise).  Do strengthening exercises at least twice a week. This is in addition to the moderate-intensity exercise.  Spend less time sitting. Even light physical activity can be beneficial. Watch cholesterol and blood lipids Have  your blood tested for lipids and cholesterol at 54 years of age, then have this test every 5 years. Have your cholesterol levels checked more often if:  Your lipid or cholesterol levels are high.  You are older than 54 years of age.  You are at high risk for heart disease. What should I know about cancer screening? Depending on your health history and family history, you may need to have cancer screening at various ages. This may include screening for:  Breast cancer.  Cervical cancer.  Colorectal cancer.  Skin cancer.  Lung cancer. What should I know about heart disease, diabetes, and high blood pressure? Blood pressure and heart disease  High blood pressure causes heart disease and increases the risk of stroke. This is more likely to develop in people who have high blood pressure readings, are of African descent, or are overweight.  Have your blood pressure checked: ? Every 3-5 years if you are 18-39 years of age. ? Every year if you are 40 years old or older. Diabetes Have regular diabetes screenings. This checks your fasting blood sugar level. Have the screening done:  Once every three years after age 40 if you are at a normal weight and have a low risk for diabetes.  More often and at a younger age if you are overweight or have a high risk for diabetes. What should I know about preventing infection? Hepatitis B If you have a higher risk for hepatitis B, you should be screened for this virus. Talk with your health care provider to find out if you are   at risk for hepatitis B infection. Hepatitis C Testing is recommended for:  Everyone born from 1945 through 1965.  Anyone with known risk factors for hepatitis C. Sexually transmitted infections (STIs)  Get screened for STIs, including gonorrhea and chlamydia, if: ? You are sexually active and are younger than 54 years of age. ? You are older than 54 years of age and your health care provider tells you that you are at  risk for this type of infection. ? Your sexual activity has changed since you were last screened, and you are at increased risk for chlamydia or gonorrhea. Ask your health care provider if you are at risk.  Ask your health care provider about whether you are at high risk for HIV. Your health care provider may recommend a prescription medicine to help prevent HIV infection. If you choose to take medicine to prevent HIV, you should first get tested for HIV. You should then be tested every 3 months for as long as you are taking the medicine. Pregnancy  If you are about to stop having your period (premenopausal) and you may become pregnant, seek counseling before you get pregnant.  Take 400 to 800 micrograms (mcg) of folic acid every day if you become pregnant.  Ask for birth control (contraception) if you want to prevent pregnancy. Osteoporosis and menopause Osteoporosis is a disease in which the bones lose minerals and strength with aging. This can result in bone fractures. If you are 65 years old or older, or if you are at risk for osteoporosis and fractures, ask your health care provider if you should:  Be screened for bone loss.  Take a calcium or vitamin D supplement to lower your risk of fractures.  Be given hormone replacement therapy (HRT) to treat symptoms of menopause. Follow these instructions at home: Lifestyle  Do not use any products that contain nicotine or tobacco, such as cigarettes, e-cigarettes, and chewing tobacco. If you need help quitting, ask your health care provider.  Do not use street drugs.  Do not share needles.  Ask your health care provider for help if you need support or information about quitting drugs. Alcohol use  Do not drink alcohol if: ? Your health care provider tells you not to drink. ? You are pregnant, may be pregnant, or are planning to become pregnant.  If you drink alcohol: ? Limit how much you use to 0-1 drink a day. ? Limit intake if you  are breastfeeding.  Be aware of how much alcohol is in your drink. In the [place] one drink equals one 12 oz bottle of beer (355 mL), one 5 oz glass of wine (148 mL), or one 1 oz glass of hard liquor (44 mL). General instructions  Schedule regular health, dental, and eye exams.  Stay current with your vaccines.  Tell your health care provider if: ? You often feel depressed. ? You have ever been abused or do not feel safe at home. Summary  Adopting a healthy lifestyle and getting preventive care are important in promoting health and wellness.  Follow your health care provider's instructions about healthy diet, exercising, and getting tested or screened for diseases.  Follow your health care provider's instructions on monitoring your cholesterol and blood pressure. This information is not intended to replace advice given to you by your health care provider. Make sure you discuss any questions you have with your health care provider. Document Released: 06/02/2011 Document Revised: 11/10/2018 Document Reviewed: 11/10/2018 Elsevier Patient Education  2020   Elsevier Inc.  
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
## 2019-09-19 ENCOUNTER — Encounter: Payer: Self-pay | Admitting: Internal Medicine

## 2019-09-19 ENCOUNTER — Other Ambulatory Visit: Payer: Self-pay

## 2019-09-19 ENCOUNTER — Other Ambulatory Visit (INDEPENDENT_AMBULATORY_CARE_PROVIDER_SITE_OTHER): Payer: BC Managed Care – PPO

## 2019-09-19 ENCOUNTER — Ambulatory Visit (INDEPENDENT_AMBULATORY_CARE_PROVIDER_SITE_OTHER): Payer: BC Managed Care – PPO | Admitting: Internal Medicine

## 2019-09-19 VITALS — BP 148/92 | HR 70 | Temp 98.4°F | Resp 16 | Ht 62.0 in | Wt 142.8 lb

## 2019-09-19 DIAGNOSIS — Z23 Encounter for immunization: Secondary | ICD-10-CM

## 2019-09-19 DIAGNOSIS — E790 Hyperuricemia without signs of inflammatory arthritis and tophaceous disease: Secondary | ICD-10-CM

## 2019-09-19 DIAGNOSIS — E782 Mixed hyperlipidemia: Secondary | ICD-10-CM

## 2019-09-19 DIAGNOSIS — Z0000 Encounter for general adult medical examination without abnormal findings: Secondary | ICD-10-CM

## 2019-09-19 DIAGNOSIS — K219 Gastro-esophageal reflux disease without esophagitis: Secondary | ICD-10-CM | POA: Diagnosis not present

## 2019-09-19 DIAGNOSIS — I10 Essential (primary) hypertension: Secondary | ICD-10-CM

## 2019-09-19 DIAGNOSIS — E119 Type 2 diabetes mellitus without complications: Secondary | ICD-10-CM

## 2019-09-19 LAB — COMPREHENSIVE METABOLIC PANEL
ALT: 42 U/L — ABNORMAL HIGH (ref 0–35)
AST: 28 U/L (ref 0–37)
Albumin: 4.5 g/dL (ref 3.5–5.2)
Alkaline Phosphatase: 83 U/L (ref 39–117)
BUN: 12 mg/dL (ref 6–23)
CO2: 24 mEq/L (ref 19–32)
Calcium: 9.6 mg/dL (ref 8.4–10.5)
Chloride: 102 mEq/L (ref 96–112)
Creatinine, Ser: 0.64 mg/dL (ref 0.40–1.20)
GFR: 96.44 mL/min (ref >60.00)
Glucose, Bld: 124 mg/dL — ABNORMAL HIGH (ref 70–99)
Potassium: 4.3 mEq/L (ref 3.5–5.1)
Sodium: 137 mEq/L (ref 135–145)
Total Bilirubin: 0.3 mg/dL (ref 0.2–1.2)
Total Protein: 7.6 g/dL (ref 6.0–8.3)

## 2019-09-19 LAB — CBC WITH DIFFERENTIAL/PLATELET
Basophils Absolute: 0.1 10*3/uL (ref 0.0–0.1)
Basophils Relative: 1 % (ref 0.0–3.0)
Eosinophils Absolute: 0.2 10*3/uL (ref 0.0–0.7)
Eosinophils Relative: 1.6 % (ref 0.0–5.0)
HCT: 41.3 % (ref 36.0–46.0)
Hemoglobin: 13.7 g/dL (ref 12.0–15.0)
Lymphocytes Relative: 31.5 % (ref 12.0–46.0)
Lymphs Abs: 3.6 10*3/uL (ref 0.7–4.0)
MCHC: 33.2 g/dL (ref 30.0–36.0)
MCV: 87.4 fl (ref 78.0–100.0)
Monocytes Absolute: 0.5 10*3/uL (ref 0.1–1.0)
Monocytes Relative: 4.5 % (ref 3.0–12.0)
Neutro Abs: 7.1 10*3/uL (ref 1.4–7.7)
Neutrophils Relative %: 61.4 % (ref 43.0–77.0)
Platelets: 379 10*3/uL (ref 150.0–400.0)
RBC: 4.72 Mil/uL (ref 3.87–5.11)
RDW: 13.2 % (ref 11.5–15.5)
WBC: 11.5 10*3/uL — ABNORMAL HIGH (ref 4.0–10.5)

## 2019-09-19 LAB — LIPID PANEL
Cholesterol: 156 mg/dL (ref 0–200)
HDL: 40 mg/dL (ref >39.00)
NonHDL: 116.03
Total CHOL/HDL Ratio: 4
Triglycerides: 294 mg/dL — ABNORMAL HIGH (ref 0.0–149.0)
VLDL: 58.8 mg/dL — ABNORMAL HIGH (ref 0.0–40.0)

## 2019-09-19 LAB — TSH: TSH: 2.33 u[IU]/mL (ref 0.35–4.50)

## 2019-09-19 LAB — HEMOGLOBIN A1C: Hgb A1c MFr Bld: 7.1 % — ABNORMAL HIGH (ref 4.6–6.5)

## 2019-09-19 LAB — URIC ACID: Uric Acid, Serum: 4.9 mg/dL (ref 2.4–7.0)

## 2019-09-19 LAB — LDL CHOLESTEROL, DIRECT: Direct LDL: 81 mg/dL

## 2019-09-19 MED ORDER — ATORVASTATIN CALCIUM 10 MG PO TABS: 10.0000 mg | ORAL_TABLET | Freq: Every day | ORAL | 1 refills | Status: DC

## 2019-09-19 MED ORDER — ALLOPURINOL 300 MG PO TABS: ORAL_TABLET | ORAL | 1 refills | Status: DC

## 2019-09-19 NOTE — Assessment & Plan Note (Signed)
GERD overall controlled She does have occasional GERD typically related to something she ate or eating too late Continue Pepcid We will work on diet and other natural things she can take if needed

## 2019-09-19 NOTE — Assessment & Plan Note (Signed)
Blood pressure slightly elevated here today, but well controlled at home-she does monitor it regularly Continue current medication She will continue to monitor her blood pressure at home CMP

## 2019-09-19 NOTE — Assessment & Plan Note (Signed)
Taking allopurinol daily We will check uric acid level

## 2019-09-19 NOTE — Assessment & Plan Note (Signed)
Check a1c Low sugar / carb diet Stressed regular exercise   

## 2019-09-19 NOTE — Assessment & Plan Note (Signed)
Check lipid panel, TSH, CMP Continue daily statin Regular exercise and healthy diet encouraged  

## 2019-09-20 ENCOUNTER — Encounter: Payer: Self-pay | Admitting: Internal Medicine

## 2019-09-20 ENCOUNTER — Other Ambulatory Visit: Payer: Self-pay | Admitting: Internal Medicine

## 2019-09-20 DIAGNOSIS — Z1231 Encounter for screening mammogram for malignant neoplasm of breast: Secondary | ICD-10-CM

## 2019-11-10 ENCOUNTER — Other Ambulatory Visit: Payer: Self-pay

## 2019-11-10 ENCOUNTER — Ambulatory Visit
Admission: RE | Admit: 2019-11-10 | Discharge: 2019-11-10 | Disposition: A | Discharge status: Home / Self Care | Payer: BC Managed Care – PPO | Source: Ambulatory Visit | Attending: Internal Medicine | Admitting: Internal Medicine

## 2019-11-10 DIAGNOSIS — Z1231 Encounter for screening mammogram for malignant neoplasm of breast: Secondary | ICD-10-CM

## 2019-11-16 ENCOUNTER — Other Ambulatory Visit: Payer: Self-pay | Admitting: Internal Medicine

## 2019-12-28 ENCOUNTER — Other Ambulatory Visit: Payer: Self-pay | Admitting: Internal Medicine

## 2020-02-10 ENCOUNTER — Ambulatory Visit: Payer: BC Managed Care – PPO | Attending: Internal Medicine

## 2020-02-10 DIAGNOSIS — Z23 Encounter for immunization: Secondary | ICD-10-CM

## 2020-02-10 NOTE — Progress Notes (Signed)
   Covid-19 Vaccination Clinic  Name:  Shemia Bevel    MRN: [id number] DOB: July 23, 1966  02/10/2020  Ms. Vandenheuvel was observed post Covid-19 immunization for 15 minutes without incident. She was provided with Vaccine Information Sheet and instruction to access the V-Safe system.   Ms. Lacosse was instructed to call 911 with any severe reactions post vaccine: Marland Kitchen Difficulty breathing  . Swelling of face and throat  . A fast heartbeat  . A bad rash all over body  . Dizziness and weakness   Immunizations Administered    Name Date Dose VIS Date Route   Pfizer COVID-19 Vaccine 02/10/2020 10:08 AM 0.3 mL 11/11/2019 Intramuscular   Manufacturer: [employer], [employer]   Lot: [id number]   NDC: [address]-2

## 2020-03-05 ENCOUNTER — Ambulatory Visit: Payer: BC Managed Care – PPO | Attending: Internal Medicine

## 2020-03-05 DIAGNOSIS — Z23 Encounter for immunization: Secondary | ICD-10-CM

## 2020-03-05 NOTE — Progress Notes (Signed)
   Covid-19 Vaccination Clinic  Name:  Savannah Rogers    MRN: [id number] DOB: 29-Sep-1966  03/05/2020  [name] was observed post Covid-19 immunization for 15 minutes without incident. She was provided with Vaccine Information Sheet and instruction to access the V-Safe system.   [name] was instructed to call 911 with any severe reactions post vaccine: Marland Kitchen Difficulty breathing  . Swelling of face and throat  . A fast heartbeat  . A bad rash all over body  . Dizziness and weakness   Immunizations Administered    Name Date Dose VIS Date Route   Pfizer COVID-19 Vaccine 03/05/2020  3:16 PM 0.3 mL 11/11/2019 Intramuscular   Manufacturer: [employer], [employer]   Lot: [id number]   NDC: [address]-6

## 2020-03-09 ENCOUNTER — Telehealth: Payer: Self-pay

## 2020-03-09 ENCOUNTER — Encounter: Payer: Self-pay | Admitting: Internal Medicine

## 2020-03-09 DIAGNOSIS — I10 Essential (primary) hypertension: Secondary | ICD-10-CM

## 2020-03-09 DIAGNOSIS — E119 Type 2 diabetes mellitus without complications: Secondary | ICD-10-CM

## 2020-03-09 DIAGNOSIS — E790 Hyperuricemia without signs of inflammatory arthritis and tophaceous disease: Secondary | ICD-10-CM

## 2020-03-09 DIAGNOSIS — E782 Mixed hyperlipidemia: Secondary | ICD-10-CM

## 2020-03-09 NOTE — Telephone Encounter (Signed)
Ordered - message sent via mychart for her

## 2020-03-09 NOTE — Telephone Encounter (Signed)
New message    The patient is asking for blood work to be entered into the system on Monday before appt on  4.15.21    Please advise

## 2020-03-12 ENCOUNTER — Other Ambulatory Visit (INDEPENDENT_AMBULATORY_CARE_PROVIDER_SITE_OTHER): Payer: BC Managed Care – PPO

## 2020-03-12 DIAGNOSIS — E119 Type 2 diabetes mellitus without complications: Secondary | ICD-10-CM | POA: Diagnosis not present

## 2020-03-12 DIAGNOSIS — I10 Essential (primary) hypertension: Secondary | ICD-10-CM

## 2020-03-12 DIAGNOSIS — E782 Mixed hyperlipidemia: Secondary | ICD-10-CM

## 2020-03-12 LAB — LIPID PANEL
Cholesterol: 210 mg/dL — ABNORMAL HIGH (ref 0–200)
HDL: 42.6 mg/dL (ref >39.00)
NonHDL: 167.55
Total CHOL/HDL Ratio: 5
Triglycerides: 332 mg/dL — ABNORMAL HIGH (ref 0.0–149.0)
VLDL: 66.4 mg/dL — ABNORMAL HIGH (ref 0.0–40.0)

## 2020-03-12 LAB — COMPREHENSIVE METABOLIC PANEL
ALT: 57 U/L — ABNORMAL HIGH (ref 0–35)
AST: 30 U/L (ref 0–37)
Albumin: 4.5 g/dL (ref 3.5–5.2)
Alkaline Phosphatase: 89 U/L (ref 39–117)
BUN: 17 mg/dL (ref 6–23)
CO2: 22 mEq/L (ref 19–32)
Calcium: 10.2 mg/dL (ref 8.4–10.5)
Chloride: 100 mEq/L (ref 96–112)
Creatinine, Ser: 0.61 mg/dL (ref 0.40–1.20)
GFR: 101.76 mL/min (ref >60.00)
Glucose, Bld: 112 mg/dL — ABNORMAL HIGH (ref 70–99)
Potassium: 4.2 mEq/L (ref 3.5–5.1)
Sodium: 135 mEq/L (ref 135–145)
Total Bilirubin: 0.5 mg/dL (ref 0.2–1.2)
Total Protein: 7.7 g/dL (ref 6.0–8.3)

## 2020-03-12 LAB — VITAMIN D 25 HYDROXY (VIT D DEFICIENCY, FRACTURES): VITD: 63.16 ng/mL (ref 30.00–100.00)

## 2020-03-12 LAB — LDL CHOLESTEROL, DIRECT: Direct LDL: 113 mg/dL

## 2020-03-12 LAB — HEMOGLOBIN A1C: Hgb A1c MFr Bld: 7 % — ABNORMAL HIGH (ref 4.6–6.5)

## 2020-03-14 NOTE — Patient Instructions (Addendum)
  Blood work was reviewed.  Blood work ordered for you to get done prior to your next visit.   Medications reviewed and updated.  Changes include :   Increase Lipitor to 20 mg daily for your cholesterol  Your prescription(s) have been submitted to your pharmacy. Please take as directed and contact our office if you believe you are having problem(s) with the medication(s).     Please followup in 6 months
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
## 2020-03-14 NOTE — Assessment & Plan Note (Addendum)
Chronic With complications of hyperlipidemia and hyperuricemia a1c 7.0  Low sugar / carb diet Stressed regular exercise Continue metformin at current dose -- stressed lifestyle changes to get sugars better controlled

## 2020-03-15 ENCOUNTER — Encounter: Payer: Self-pay | Admitting: Internal Medicine

## 2020-03-15 ENCOUNTER — Other Ambulatory Visit: Payer: Self-pay

## 2020-03-15 ENCOUNTER — Ambulatory Visit: Payer: BC Managed Care – PPO | Admitting: Internal Medicine

## 2020-03-15 VITALS — BP 154/92 | HR 84 | Temp 98.5°F | Resp 16 | Ht 62.0 in | Wt 144.0 lb

## 2020-03-15 DIAGNOSIS — I10 Essential (primary) hypertension: Secondary | ICD-10-CM

## 2020-03-15 DIAGNOSIS — E1169 Type 2 diabetes mellitus with other specified complication: Secondary | ICD-10-CM

## 2020-03-15 DIAGNOSIS — E559 Vitamin D deficiency, unspecified: Secondary | ICD-10-CM

## 2020-03-15 DIAGNOSIS — E782 Mixed hyperlipidemia: Secondary | ICD-10-CM | POA: Diagnosis not present

## 2020-03-15 DIAGNOSIS — E790 Hyperuricemia without signs of inflammatory arthritis and tophaceous disease: Secondary | ICD-10-CM

## 2020-03-15 DIAGNOSIS — K219 Gastro-esophageal reflux disease without esophagitis: Secondary | ICD-10-CM | POA: Diagnosis not present

## 2020-03-15 MED ORDER — ATORVASTATIN CALCIUM 20 MG PO TABS: 20.0000 mg | ORAL_TABLET | Freq: Every day | ORAL | 1 refills | Status: DC

## 2020-03-15 MED ORDER — METFORMIN HCL 1000 MG PO TABS: 1000.0000 mg | ORAL_TABLET | Freq: Two times a day (BID) | ORAL | 1 refills | Status: DC

## 2020-03-15 NOTE — Assessment & Plan Note (Addendum)
Chronic BP well controlled at home, elevated here Current regimen effective and well tolerated Continue current medications at current doses cmp reviewed  - GFR, K normal

## 2020-03-15 NOTE — Assessment & Plan Note (Signed)
Chronic GERD controlled Continue daily medication - pepcid 40 mg daily  

## 2020-03-15 NOTE — Assessment & Plan Note (Addendum)
Chronic Uric acid 4.9 09/2019 - well controlled Continue allopurinol 300 mg daily

## 2020-03-15 NOTE — Assessment & Plan Note (Signed)
Chronic Cholesterol numbers slightly higher - trigs not controlled, LDL ok  Increase lipitor to 20 mg daily Regular exercise and healthy diet encouraged

## 2020-03-15 NOTE — Assessment & Plan Note (Signed)
Chronic Vitamin D level is very good Continue daily vitamin d

## 2020-03-17 ENCOUNTER — Other Ambulatory Visit: Payer: Self-pay | Admitting: Internal Medicine

## 2020-03-22 ENCOUNTER — Other Ambulatory Visit: Payer: Self-pay | Admitting: Internal Medicine

## 2020-03-30 DIAGNOSIS — Z124 Encounter for screening for malignant neoplasm of cervix: Secondary | ICD-10-CM | POA: Diagnosis not present

## 2020-04-16 ENCOUNTER — Ambulatory Visit: Payer: BC Managed Care – PPO | Admitting: Internal Medicine

## 2020-04-16 ENCOUNTER — Other Ambulatory Visit: Payer: Self-pay

## 2020-04-16 ENCOUNTER — Encounter: Payer: Self-pay | Admitting: Internal Medicine

## 2020-04-16 DIAGNOSIS — X088XXA Exposure to other specified smoke, fire and flames, initial encounter: Secondary | ICD-10-CM | POA: Diagnosis not present

## 2020-04-16 DIAGNOSIS — T2024XA Burn of second degree of nose (septum), initial encounter: Secondary | ICD-10-CM | POA: Insufficient documentation

## 2020-04-16 MED ORDER — SILVER SULFADIAZINE 1 % EX CREA: 1.0000 "application " | TOPICAL_CREAM | Freq: Every day | CUTANEOUS | 0 refills | Status: DC
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
## 2020-04-25 DIAGNOSIS — R05 Cough: Secondary | ICD-10-CM | POA: Diagnosis not present

## 2020-04-25 DIAGNOSIS — B9689 Other specified bacterial agents as the cause of diseases classified elsewhere: Secondary | ICD-10-CM | POA: Diagnosis not present

## 2020-04-25 DIAGNOSIS — J019 Acute sinusitis, unspecified: Secondary | ICD-10-CM | POA: Diagnosis not present

## 2020-09-11 ENCOUNTER — Other Ambulatory Visit: Payer: Self-pay | Admitting: Internal Medicine

## 2020-09-12 ENCOUNTER — Other Ambulatory Visit: Payer: Self-pay | Admitting: Internal Medicine

## 2020-09-17 ENCOUNTER — Other Ambulatory Visit (INDEPENDENT_AMBULATORY_CARE_PROVIDER_SITE_OTHER): Payer: BC Managed Care – PPO

## 2020-09-17 ENCOUNTER — Other Ambulatory Visit: Payer: Self-pay

## 2020-09-17 DIAGNOSIS — I10 Essential (primary) hypertension: Secondary | ICD-10-CM | POA: Diagnosis not present

## 2020-09-17 DIAGNOSIS — E782 Mixed hyperlipidemia: Secondary | ICD-10-CM

## 2020-09-17 DIAGNOSIS — E1169 Type 2 diabetes mellitus with other specified complication: Secondary | ICD-10-CM | POA: Diagnosis not present

## 2020-09-17 DIAGNOSIS — E790 Hyperuricemia without signs of inflammatory arthritis and tophaceous disease: Secondary | ICD-10-CM

## 2020-09-17 LAB — COMPREHENSIVE METABOLIC PANEL
ALT: 43 U/L — ABNORMAL HIGH (ref 0–35)
AST: 28 U/L (ref 0–37)
Albumin: 4.4 g/dL (ref 3.5–5.2)
Alkaline Phosphatase: 92 U/L (ref 39–117)
BUN: 16 mg/dL (ref 6–23)
CO2: 23 mEq/L (ref 19–32)
Calcium: 9.9 mg/dL (ref 8.4–10.5)
Chloride: 102 mEq/L (ref 96–112)
Creatinine, Ser: 0.62 mg/dL (ref 0.40–1.20)
GFR: 101 mL/min (ref >60.00)
Glucose, Bld: 143 mg/dL — ABNORMAL HIGH (ref 70–99)
Potassium: 4.4 mEq/L (ref 3.5–5.1)
Sodium: 135 mEq/L (ref 135–145)
Total Bilirubin: 0.4 mg/dL (ref 0.2–1.2)
Total Protein: 7.7 g/dL (ref 6.0–8.3)

## 2020-09-17 LAB — CBC WITH DIFFERENTIAL/PLATELET
Basophils Absolute: 0.1 10*3/uL (ref 0.0–0.1)
Basophils Relative: 0.6 % (ref 0.0–3.0)
Eosinophils Absolute: 0.3 10*3/uL (ref 0.0–0.7)
Eosinophils Relative: 1.9 % (ref 0.0–5.0)
HCT: 40.3 % (ref 36.0–46.0)
Hemoglobin: 13.5 g/dL (ref 12.0–15.0)
Lymphocytes Relative: 32.9 % (ref 12.0–46.0)
Lymphs Abs: 4.4 10*3/uL — ABNORMAL HIGH (ref 0.7–4.0)
MCHC: 33.4 g/dL (ref 30.0–36.0)
MCV: 87.7 fl (ref 78.0–100.0)
Monocytes Absolute: 0.7 10*3/uL (ref 0.1–1.0)
Monocytes Relative: 5.2 % (ref 3.0–12.0)
Neutro Abs: 8 10*3/uL — ABNORMAL HIGH (ref 1.4–7.7)
Neutrophils Relative %: 59.4 % (ref 43.0–77.0)
Platelets: 345 10*3/uL (ref 150.0–400.0)
RBC: 4.59 Mil/uL (ref 3.87–5.11)
RDW: 13.8 % (ref 11.5–15.5)
WBC: 13.5 10*3/uL — ABNORMAL HIGH (ref 4.0–10.5)

## 2020-09-17 LAB — TSH: TSH: 2.75 u[IU]/mL (ref 0.35–4.50)

## 2020-09-17 LAB — HEMOGLOBIN A1C: Hgb A1c MFr Bld: 8 % — ABNORMAL HIGH (ref 4.6–6.5)

## 2020-09-17 LAB — LIPID PANEL
Cholesterol: 158 mg/dL (ref 0–200)
HDL: 38.6 mg/dL — ABNORMAL LOW (ref >39.00)
NonHDL: 119.85
Total CHOL/HDL Ratio: 4
Triglycerides: 376 mg/dL — ABNORMAL HIGH (ref 0.0–149.0)
VLDL: 75.2 mg/dL — ABNORMAL HIGH (ref 0.0–40.0)

## 2020-09-17 LAB — URIC ACID: Uric Acid, Serum: 5.6 mg/dL (ref 2.4–7.0)

## 2020-09-17 LAB — LDL CHOLESTEROL, DIRECT: Direct LDL: 73 mg/dL

## 2020-09-18 NOTE — Progress Notes (Signed)
Subjective:    Patient ID: Savannah Rogers, female    DOB: August 27, 1966, 54 y.o.   MRN: [id number]  HPI The patient is here for follow up of their chronic medical problems, including htn, DM, hyperlipidemia, hyperuricemia, GERD   She is exercising regularly.   She did not exercise for a while as regularly.    She is not sure why her sugars are higher - ? Eating more bread.  She denies other changes.    Medications and allergies reviewed with patient and updated if appropriate.  Patient Active Problem List   Diagnosis Date Noted  . Second degree burn of nose, initial encounter 04/16/2020  . Umbilical hernia without obstruction and without gangrene 12/14/2018  . Gastroesophageal reflux disease 12/14/2018  . Diabetes ([place]) 09/09/2017  . Pancreatitis 03/03/2017  . Abnormal CXR 04/19/2016  . Upper airway cough syndrome likely related to allergic rhinitis  04/10/2016  . Essential hypertension, benign 03/05/2016  . Non-allergic rhinitis 06/13/2015  . Vitamin D deficiency 05/22/2015  . Hyperuricemia 03/24/2013  . Mixed hyperlipidemia 03/06/2008    Current Outpatient Medications on File Prior to Visit  Medication Sig Dispense Refill  . allopurinol (ZYLOPRIM) 300 MG tablet TAKE 1 TABLET BY MOUTH EVERY DAY Annual appt due must see provider for future refills 30 tablet 0  . atorvastatin (LIPITOR) 20 MG tablet TAKE 1 TABLET BY MOUTH EVERY DAY 90 tablet 1  . fluticasone (FLONASE) 50 MCG/ACT nasal spray Place into both nostrils.    . silver sulfADIAZINE (SILVADENE) 1 % cream Apply 1 application topically daily. 50 g 0  . telmisartan (MICARDIS) 40 MG tablet TAKE 1 TABLET BY MOUTH EVERY DAY 90 tablet 1  . [DISCONTINUED] metFORMIN (GLUCOPHAGE) 1000 MG tablet Take 1 tablet (1,000 mg total) by mouth 2 (two) times daily with a meal. 180 tablet 1   No current facility-administered medications on file prior to visit.    Past Medical History:  Diagnosis Date  . Diabetes mellitus without  complication ([place])   . Hyperlipidemia    NMR 05-2010:ldl 55(1415/711),HDL 48, TG 173. NO FH of MI.[place] study LDL goal  LDL goal =<160  . Hypertension   . Hyperuricemia     Past Surgical History:  Procedure Laterality Date  . BONE MARROW BIOPSY  2006   for elevated WBC  . CHOLECYSTECTOMY  03/16/2012   INTRAOPERATIVE CHOLANGIOGRAM;  Surgeon: Earnstine Regal, MD;  Location: [facility];  Service: General;  Laterality: N/A;  . G3  P1  A2    . laparascopic cholecystectomy  03/2012   Dr Harlow Asa    Social History   Socioeconomic History  . Marital status: Single    Spouse name: Not on file  . Number of children: 1  . Years of education: Not on file  . Highest education level: Not on file  Occupational History  . 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 Smoking status: Never Smoker  . Smokeless tobacco: Never Used  Substance and Sexual Activity  . Alcohol use: No    Alcohol/week: 0.0 standard drinks  . Drug use: No  . Sexual activity: Not on file  Other Topics Concern  . Not on file  Social History Narrative  . Not on file   Social Determinants of Health   Financial Resource Strain:   . Difficulty of Paying Living Expenses: Not on file  Food Insecurity:   .  Worried About Charity fundraiser in the Last Year: Not on file  . Ran Out of Food in the Last Year: Not on file  Transportation Needs:   . Lack of Transportation (Medical): Not on file  . Lack of Transportation (Non-Medical): Not on file  Physical Activity:   . Days of Exercise per Week: Not on file  . Minutes of Exercise per Session: Not on file  Stress:   . Feeling of Stress : Not on file  Social Connections:   . Frequency of Communication with Friends and Family: Not on file  . Frequency of Social Gatherings with Friends and Family: Not on file  . Attends Religious Services: Not on file  . Active Member of Clubs or Organizations: Not  on file  . Attends Archivist Meetings: Not on file  . Marital Status: Not on file    Family History  Problem Relation Age of Onset  . Stroke Paternal Grandmother 62  . Stroke Paternal Grandfather 60  . Hypertension Father   . Hypertension Mother   . Breast cancer Maternal Aunt   . Diabetes Neg Hx   . Heart disease Neg Hx     Review of Systems  Constitutional: Negative for chills and fever.  Respiratory: Negative for cough, shortness of breath and wheezing.   Cardiovascular: Negative for chest pain, palpitations and leg swelling.  Neurological: Negative for dizziness, light-headedness and headaches.       Objective:   Vitals:   09/19/20 0856  BP: 140/76  Pulse: 94  Temp: 98.4 F (36.9 C)  SpO2: 98%   BP Readings from Last 3 Encounters:  09/19/20 140/76  04/16/20 140/80  03/15/20 (!) 154/92   Wt Readings from Last 3 Encounters:  09/19/20 147 lb (66.7 kg)  04/16/20 144 lb 6.4 oz (65.5 kg)  03/15/20 144 lb (65.3 kg)   Body mass index is 26.89 kg/m.   Physical Exam    Constitutional: Appears well-developed and well-nourished. No distress.  HENT:  Head: Normocephalic and atraumatic.  Neck: Neck supple. No tracheal deviation present. No thyromegaly present.  No cervical lymphadenopathy Cardiovascular: Normal rate, regular rhythm and normal heart sounds.   No murmur heard. No carotid bruit .  No edema Pulmonary/Chest: Effort normal and breath sounds normal. No respiratory distress. No has no wheezes. No rales.  Skin: Skin is warm and dry. Not diaphoretic.  Psychiatric: Normal mood and affect. Behavior is normal.   Lab Results  Component Value Date   WBC 13.5 (H) 09/17/2020   HGB 13.5 09/17/2020   HCT 40.3 09/17/2020   PLT 345.0 09/17/2020   GLUCOSE 143 (H) 09/17/2020   CHOL 158 09/17/2020   TRIG 376.0 (H) 09/17/2020   HDL 38.60 (L) 09/17/2020   LDLDIRECT 73.0 09/17/2020   LDLCALC 87 08/28/2016   ALT 43 (H) 09/17/2020   AST 28 09/17/2020   NA  135 09/17/2020   K 4.4 09/17/2020   CL 102 09/17/2020   CREATININE 0.62 09/17/2020   BUN 16 09/17/2020   CO2 23 09/17/2020   TSH 2.75 09/17/2020   HGBA1C 8.0 (H) 09/17/2020   MICROALBUR 0.9 08/28/2016      Assessment & Plan:    See Problem List for Assessment and Plan of chronic medical problems.    This visit occurred during the SARS-CoV-2 public health emergency.  Safety protocols were in place, including screening questions prior to the visit, additional usage of staff PPE, and extensive cleaning of exam room while observing appropriate  contact time as indicated for disinfecting solutions.

## 2020-09-18 NOTE — Patient Instructions (Addendum)
Flu immunization administered today.     Blood work was ordered.  Have it done a few days prior to your next appointment.    Medications reviewed and updated.  Changes include :   Metformin changed to extended release.   Your prescription(s) have been submitted to your pharmacy. Please take as directed and contact our office if you believe you are having problem(s) with the medication(s).    Please followup in 3 months

## 2020-09-19 ENCOUNTER — Ambulatory Visit: Payer: BC Managed Care – PPO | Admitting: Internal Medicine

## 2020-09-19 ENCOUNTER — Encounter: Payer: Self-pay | Admitting: Internal Medicine

## 2020-09-19 ENCOUNTER — Other Ambulatory Visit: Payer: Self-pay

## 2020-09-19 VITALS — BP 140/76 | HR 94 | Temp 98.4°F | Ht 62.0 in | Wt 147.0 lb

## 2020-09-19 DIAGNOSIS — E790 Hyperuricemia without signs of inflammatory arthritis and tophaceous disease: Secondary | ICD-10-CM

## 2020-09-19 DIAGNOSIS — E782 Mixed hyperlipidemia: Secondary | ICD-10-CM | POA: Diagnosis not present

## 2020-09-19 DIAGNOSIS — Z23 Encounter for immunization: Secondary | ICD-10-CM

## 2020-09-19 DIAGNOSIS — I10 Essential (primary) hypertension: Secondary | ICD-10-CM

## 2020-09-19 DIAGNOSIS — E1165 Type 2 diabetes mellitus with hyperglycemia: Secondary | ICD-10-CM

## 2020-09-19 DIAGNOSIS — K219 Gastro-esophageal reflux disease without esophagitis: Secondary | ICD-10-CM

## 2020-09-19 MED ORDER — METFORMIN HCL ER 500 MG PO TB24: 1000.0000 mg | ORAL_TABLET | Freq: Two times a day (BID) | ORAL | 1 refills | Status: DC

## 2020-09-19 NOTE — Assessment & Plan Note (Signed)
Chronic, intermittent Has had more GERD symptoms recently Has pepcid at home - advised to take daily for 1-2 weeks and then stop Discussed foods that may cause GERD

## 2020-09-19 NOTE — Assessment & Plan Note (Signed)
Chronic Denies any symptoms suggestive of gout Continue allopurinol 300 mg daily.

## 2020-09-19 NOTE — Assessment & Plan Note (Addendum)
Chronic BP controlled, borderline Continue telmisartan 40 mg daily She will work on lifestyle changes and hopefully that will help improve blood pressure CMP reviewed-kidney function normal

## 2020-09-19 NOTE — Assessment & Plan Note (Signed)
Chronic Lipids well controlled Continue atorvastatin 20 mg daily 

## 2020-09-19 NOTE — Assessment & Plan Note (Addendum)
Chronic Not ideally controlled, a1c 8.0% She is unsure if she has been eating more bread, but denies any other obvious causes of sugars being higher She was not exercising as regularly and that could be contributing She is now exercising regularly and will continue Decrease carbohydrates, sweets She does not want to change any medications at this time and really wants to work on lifestyle first I will change Metformin to the extended release and keep the dose the same because she is having some loose stools at times Follow-up in 3 months with A1c prior

## 2020-09-20 NOTE — Addendum Note (Signed)
Addended by: Karma Ganja on: 09/20/2020 03:56 PM   Modules accepted: Orders

## 2020-10-05 ENCOUNTER — Other Ambulatory Visit: Payer: Self-pay | Admitting: Internal Medicine

## 2020-10-10 ENCOUNTER — Other Ambulatory Visit: Payer: Self-pay | Admitting: Internal Medicine

## 2020-10-10 DIAGNOSIS — Z1231 Encounter for screening mammogram for malignant neoplasm of breast: Secondary | ICD-10-CM

## 2020-10-31 DIAGNOSIS — U071 COVID-19: Secondary | ICD-10-CM

## 2020-10-31 HISTORY — DX: COVID-19: U07.1

## 2020-11-20 ENCOUNTER — Other Ambulatory Visit: Payer: Self-pay

## 2020-11-20 ENCOUNTER — Ambulatory Visit
Admission: RE | Admit: 2020-11-20 | Discharge: 2020-11-20 | Disposition: A | Discharge status: Home / Self Care | Payer: BC Managed Care – PPO | Source: Ambulatory Visit | Attending: Internal Medicine | Admitting: Internal Medicine

## 2020-11-20 DIAGNOSIS — Z1231 Encounter for screening mammogram for malignant neoplasm of breast: Secondary | ICD-10-CM

## 2020-11-28 DIAGNOSIS — Z03818 Encounter for observation for suspected exposure to other biological agents ruled out: Secondary | ICD-10-CM | POA: Diagnosis not present

## 2020-11-29 DIAGNOSIS — U071 COVID-19: Secondary | ICD-10-CM | POA: Diagnosis not present

## 2020-11-29 DIAGNOSIS — Z20828 Contact with and (suspected) exposure to other viral communicable diseases: Secondary | ICD-10-CM | POA: Diagnosis not present

## 2020-11-29 DIAGNOSIS — J101 Influenza due to other identified influenza virus with other respiratory manifestations: Secondary | ICD-10-CM | POA: Diagnosis not present

## 2020-11-29 DIAGNOSIS — B974 Respiratory syncytial virus as the cause of diseases classified elsewhere: Secondary | ICD-10-CM | POA: Diagnosis not present

## 2020-12-01 ENCOUNTER — Other Ambulatory Visit: Payer: Self-pay

## 2020-12-01 ENCOUNTER — Emergency Department (HOSPITAL_COMMUNITY)
Admission: EM | Admit: 2020-12-01 | Discharge: 2020-12-02 | Disposition: A | Discharge status: Home / Self Care | Payer: BC Managed Care – PPO | Attending: Emergency Medicine | Admitting: Emergency Medicine

## 2020-12-01 DIAGNOSIS — R110 Nausea: Secondary | ICD-10-CM | POA: Diagnosis not present

## 2020-12-01 DIAGNOSIS — Z79899 Other long term (current) drug therapy: Secondary | ICD-10-CM | POA: Insufficient documentation

## 2020-12-01 DIAGNOSIS — E119 Type 2 diabetes mellitus without complications: Secondary | ICD-10-CM | POA: Diagnosis not present

## 2020-12-01 DIAGNOSIS — U071 COVID-19: Secondary | ICD-10-CM | POA: Insufficient documentation

## 2020-12-01 DIAGNOSIS — Z7984 Long term (current) use of oral hypoglycemic drugs: Secondary | ICD-10-CM | POA: Diagnosis not present

## 2020-12-01 DIAGNOSIS — I10 Essential (primary) hypertension: Secondary | ICD-10-CM | POA: Insufficient documentation

## 2020-12-01 DIAGNOSIS — R531 Weakness: Secondary | ICD-10-CM | POA: Diagnosis not present

## 2020-12-01 DIAGNOSIS — R059 Cough, unspecified: Secondary | ICD-10-CM | POA: Diagnosis not present

## 2020-12-01 NOTE — ED Triage Notes (Signed)
Pt arriving via [facility] for being Covid positive. Pt tested positive on Wednesday. Complaining of generalized weakness and nausea. Pt A&O x4 upon arrival.

## 2020-12-02 MED ORDER — HYDROCOD POLST-CPM POLST ER 10-8 MG/5ML PO SUER
5.0000 mL | Freq: Once | ORAL | Status: AC
  Administered 2020-12-02: 5 mL via ORAL
  Filled 2020-12-02: qty 5

## 2020-12-02 MED ORDER — HYDROCOD POLST-CPM POLST ER 10-8 MG/5ML PO SUER: 5.0000 mL | Freq: Two times a day (BID) | ORAL | 0 refills | Status: DC | PRN

## 2020-12-02 NOTE — ED Notes (Signed)
Bedside comode at this time.

## 2020-12-02 NOTE — ED Provider Notes (Signed)
TIME SEEN: 12:25 AM  CHIEF COMPLAINT: cough  HPI: Patient is a 55 year old female with history of hypertension, diabetes, hyperlipidemia who presents to the emergency department with complaints of cough and requesting medication for her cough.  Tested positive for COVID-19 on 11/28/2020.  Denies chest pain, shortness of breath, vomiting, diarrhea.  Took over-the-counter cough medication once without significant relief.  Denies having any fever.  + COVID vaccines.  ROS: See HPI Constitutional: no fever  Eyes: no drainage  ENT: no runny nose   Cardiovascular:  no chest pain  Resp: no SOB  GI: no vomiting GU: no dysuria Integumentary: no rash  Allergy: no hives  Musculoskeletal: no leg swelling  Neurological: no slurred speech ROS otherwise negative  PAST MEDICAL HISTORY/PAST SURGICAL HISTORY:  Past Medical History:  Diagnosis Date  . Diabetes mellitus without complication ([facility])   . Hyperlipidemia    NMR 05-2010:ldl 55(1415/711),HDL 48, TG 173. NO FH of MI.[place] study LDL goal  LDL goal =<160  . Hypertension   . Hyperuricemia     MEDICATIONS:  Prior to Admission medications   Medication Sig Start Date End Date Taking? Authorizing Provider  allopurinol (ZYLOPRIM) 300 MG tablet TAKE 1 TABLET BY MOUTH EVERY DAY ANNUAL APPT DUE MUST SEE PROVIDER FOR FUTURE REFILLS 10/05/20   Pincus Sanes, MD  atorvastatin (LIPITOR) 20 MG tablet TAKE 1 TABLET BY MOUTH EVERY DAY 09/12/20   Burns, Bobette Mo, MD  fluticasone (FLONASE) 50 MCG/ACT nasal spray Place into both nostrils. 04/25/20   [provider]  metFORMIN (GLUCOPHAGE XR) 500 MG 24 hr tablet Take 2 tablets (1,000 mg total) by mouth 2 (two) times daily before a meal. 09/19/20   Burns, Bobette Mo, MD  silver sulfADIAZINE (SILVADENE) 1 % cream Apply 1 application topically daily. 04/16/20   Pincus Sanes, MD  telmisartan (MICARDIS) 40 MG tablet TAKE 1 TABLET BY MOUTH EVERY DAY 09/12/20   Pincus Sanes, MD    ALLERGIES:  No Known  Allergies  SOCIAL HISTORY:  Social History   Tobacco Use  . Smoking status: Never Smoker  . Smokeless tobacco: Never Used  Substance Use Topics  . Alcohol use: No    Alcohol/week: 0.0 standard drinks    FAMILY HISTORY: Family History  Problem Relation Age of Onset  . Stroke Paternal Grandmother 58  . Stroke Paternal Grandfather 20  . Hypertension Father   . Hypertension Mother   . Breast cancer Maternal Aunt   . Diabetes Neg Hx   . Heart disease Neg Hx     EXAM: BP (!) 180/86 (BP Location: Right Arm)   Pulse 77   Temp 98.5 F (36.9 C) (Oral)   Resp 13   Ht 5\' 2"  (1.575 m)   Wt 64 kg   SpO2 99%   BMI 25.79 kg/m  CONSTITUTIONAL: Alert and oriented and responds appropriately to questions. Well-appearing; well-nourished HEAD: Normocephalic EYES: Conjunctivae clear, pupils appear equal, EOM appear intact ENT: normal nose; moist mucous membranes NECK: Supple, normal ROM CARD: RRR; S1 and S2 appreciated; no murmurs, no clicks, no rubs, no gallops RESP: Normal chest excursion without splinting or tachypnea; breath sounds clear and equal bilaterally; no wheezes, no rhonchi, no rales, no hypoxia or respiratory distress, speaking full sentences, intermittent dry cough ABD/GI: Normal bowel sounds; non-distended; soft, non-tender, no rebound, no guarding, no peritoneal signs, no hepatosplenomegaly BACK:  The back appears normal EXT: Normal ROM in all joints; no deformity noted, no edema; no cyanosis SKIN: Normal color for  age and race; warm; no rash on exposed skin NEURO: Moves all extremities equally PSYCH: The patient's mood and manner are appropriate.   MEDICAL DECISION MAKING: Patient here requesting medication for cough.  She recently diagnosed with COVID-19.  No chest pain or shortness of breath.  No respiratory distress, hypoxia, increased work of breathing.  Will provide with prescription for Tussionex to help with symptomatic relief.  Discussed supportive care  instructions and return precautions.  She is afebrile and well-appearing here.  Given significant limitations in monoclonal antibody supply, I do not feel she meets criteria for infusion.  Discussed return precautions and supportive care instructions.  She verbalized understanding.   At this time, I do not feel there is any life-threatening condition present. I have reviewed, interpreted and discussed all results (EKG, imaging, lab, urine as appropriate) and exam findings with patient/family. I have reviewed nursing notes and appropriate previous records.  I feel the patient is safe to be discharged home without further emergent workup and can continue workup as an outpatient as needed. Discussed usual and customary return precautions. Patient/family verbalize understanding and are comfortable with this plan.  Outpatient follow-up has been provided as needed. All questions have been answered.   Tandy Grawe was evaluated in Emergency Department on 12/02/2020 for the symptoms described in the history of present illness. She was evaluated in the context of the global COVID-19 pandemic, which necessitated consideration that the patient might be at risk for infection with the SARS-CoV-2 virus that causes COVID-19. Institutional protocols and algorithms that pertain to the evaluation of patients at risk for COVID-19 are in a state of rapid change based on information released by regulatory bodies including the CDC and federal and state organizations. These policies and algorithms were followed during the patient's care in the ED.      Tanelle Lanzo, Layla Maw, DO 12/02/20 [phone]

## 2020-12-02 NOTE — Discharge Instructions (Addendum)
Please quarantine for 10 days after the onset of symptoms.  You will need to be fever free without using Tylenol or Ibuprofen for 3 full days AND your symptoms will need to be significantly improving or resolved (other than the loss of taste and smell which can last up to 3 months) before you can come out of quarantine. ° °You should isolate from others that you live with as well. ° °Any close contacts that have seen you in the past 2 days before your symptoms have started will need to be notified and they will need to quarantine for 14 full days even if they have a negative COVID test. ° °COVID-19 is a viral illness and currently there are no specific outpatient treatments other than supportive measures such as alternating Tylenol and Ibuprofen, rest, increased fluid intake.  You do not need antibiotics. ° °If you develop chest pain, difficulty breathing, have blue lips or fingertips, begin vomiting and can not stop and can not hold down fluids, feel like you may pass out or you do pass out, have confusion, please return to the emergency department. ° °You may alternate Tylenol 1000 mg every 6 hours as needed for pain, fever and Ibuprofen 800 mg every 8 hours as needed for pain, fever.  Please take Ibuprofen with food.  Do not take more than 4000 mg of Tylenol (acetaminophen) in a 24 hour period. ° ° °

## 2020-12-03 ENCOUNTER — Telehealth: Payer: Self-pay | Admitting: Internal Medicine

## 2020-12-03 NOTE — Telephone Encounter (Signed)
[facility] FYI  Caller says her sister in law and she has COVID. She has been isolated but today she is very confused and has BP of 177/104. The confusion started today.  [facility] advised: Call EMS 911 now  Patient understood and went to ED.

## 2020-12-17 ENCOUNTER — Other Ambulatory Visit: Payer: BC Managed Care – PPO

## 2020-12-19 ENCOUNTER — Other Ambulatory Visit (INDEPENDENT_AMBULATORY_CARE_PROVIDER_SITE_OTHER): Payer: BC Managed Care – PPO

## 2020-12-19 ENCOUNTER — Other Ambulatory Visit: Payer: Self-pay

## 2020-12-19 DIAGNOSIS — I10 Essential (primary) hypertension: Secondary | ICD-10-CM | POA: Diagnosis not present

## 2020-12-19 DIAGNOSIS — E1165 Type 2 diabetes mellitus with hyperglycemia: Secondary | ICD-10-CM | POA: Diagnosis not present

## 2020-12-19 DIAGNOSIS — E782 Mixed hyperlipidemia: Secondary | ICD-10-CM | POA: Diagnosis not present

## 2020-12-19 LAB — LDL CHOLESTEROL, DIRECT: Direct LDL: 80 mg/dL

## 2020-12-19 LAB — COMPREHENSIVE METABOLIC PANEL
ALT: 61 U/L — ABNORMAL HIGH (ref 0–35)
AST: 39 U/L — ABNORMAL HIGH (ref 0–37)
Albumin: 4.7 g/dL (ref 3.5–5.2)
Alkaline Phosphatase: 90 U/L (ref 39–117)
BUN: 8 mg/dL (ref 6–23)
CO2: 25 mEq/L (ref 19–32)
Calcium: 10.1 mg/dL (ref 8.4–10.5)
Chloride: 102 mEq/L (ref 96–112)
Creatinine, Ser: 0.62 mg/dL (ref 0.40–1.20)
GFR: 99.87 mL/min (ref >60.00)
Glucose, Bld: 116 mg/dL — ABNORMAL HIGH (ref 70–99)
Potassium: 4.4 mEq/L (ref 3.5–5.1)
Sodium: 138 mEq/L (ref 135–145)
Total Bilirubin: 0.5 mg/dL (ref 0.2–1.2)
Total Protein: 7.7 g/dL (ref 6.0–8.3)

## 2020-12-19 LAB — LIPID PANEL
Cholesterol: 161 mg/dL (ref 0–200)
HDL: 43.3 mg/dL (ref >39.00)
NonHDL: 117.5
Total CHOL/HDL Ratio: 4
Triglycerides: 336 mg/dL — ABNORMAL HIGH (ref 0.0–149.0)
VLDL: 67.2 mg/dL — ABNORMAL HIGH (ref 0.0–40.0)

## 2020-12-19 LAB — HEMOGLOBIN A1C: Hgb A1c MFr Bld: 7.3 % — ABNORMAL HIGH (ref 4.6–6.5)

## 2020-12-19 NOTE — Progress Notes (Signed)
Subjective:    Patient ID: Savannah Rogers, female    DOB: Apr 07, 1966, 55 y.o.   MRN: [id number]  HPI The patient is here for follow up of their chronic medical problems, including htn, DM, hyperlipidemia, hyperuricemia, GERD   She is exercising regularly.   She has decreased her bread intake and is eating more veges.    Sugar at home 112, 108   Medications and allergies reviewed with patient and updated if appropriate.  Patient Active Problem List   Diagnosis Date Noted  . Second degree burn of nose, initial encounter 04/16/2020  . Umbilical hernia without obstruction and without gangrene 12/14/2018  . Gastroesophageal reflux disease 12/14/2018  . Diabetes ([place]) 09/09/2017  . Pancreatitis 03/03/2017  . Abnormal CXR 04/19/2016  . Upper airway cough syndrome likely related to allergic rhinitis  04/10/2016  . Essential hypertension, benign 03/05/2016  . Non-allergic rhinitis 06/13/2015  . Vitamin D deficiency 05/22/2015  . Hyperuricemia 03/24/2013  . Mixed hyperlipidemia 03/06/2008    Current Outpatient Medications on File Prior to Visit  Medication Sig Dispense Refill  . allopurinol (ZYLOPRIM) 300 MG tablet TAKE 1 TABLET BY MOUTH EVERY DAY ANNUAL APPT DUE MUST SEE PROVIDER FOR FUTURE REFILLS 90 tablet 1  . atorvastatin (LIPITOR) 20 MG tablet TAKE 1 TABLET BY MOUTH EVERY DAY 90 tablet 1  . fluticasone (FLONASE) 50 MCG/ACT nasal spray Place into both nostrils.    . metFORMIN (GLUCOPHAGE XR) 500 MG 24 hr tablet Take 2 tablets (1,000 mg total) by mouth 2 (two) times daily before a meal. 360 tablet 1  . silver sulfADIAZINE (SILVADENE) 1 % cream Apply 1 application topically daily. 50 g 0  . telmisartan (MICARDIS) 40 MG tablet TAKE 1 TABLET BY MOUTH EVERY DAY 90 tablet 1   No current facility-administered medications on file prior to visit.    Past Medical History:  Diagnosis Date  . Diabetes mellitus without complication ([place])   . Hyperlipidemia    NMR 05-2010:ldl  55(1415/711),HDL 48, TG 173. NO FH of MI.[place] study LDL goal  LDL goal =<160  . Hypertension   . Hyperuricemia     Past Surgical History:  Procedure Laterality Date  . BONE MARROW BIOPSY  2006   for elevated WBC  . CHOLECYSTECTOMY  03/16/2012   INTRAOPERATIVE CHOLANGIOGRAM;  Surgeon: Earnstine Regal, MD;  Location: [facility];  Service: General;  Laterality: N/A;  . G3  P1  A2    . laparascopic cholecystectomy  03/2012   Dr Harlow Asa    Social History   Socioeconomic History  . Marital status: Single    Spouse name: Not on file  . Number of children: 1  . Years of education: Not on file  . Highest education level: Not on file  Occupational History  . 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 Smoking status: Never Smoker  . Smokeless tobacco: Never Used  Substance and Sexual Activity  . Alcohol use: No    Alcohol/week: 0.0 standard drinks  . Drug use: No  . Sexual activity: Not on file  Other Topics Concern  . Not on file  Social History Narrative  . Not on file   Social Determinants of Health   Financial Resource Strain: Not on file  Food Insecurity: Not on file  Transportation Needs: Not on file  Physical Activity: Not on file  Stress: Not on file  Social  Connections: Not on file    Family History  Problem Relation Age of Onset  . Stroke Paternal Grandmother 75  . Stroke Paternal Grandfather 49  . Hypertension Father   . Hypertension Mother   . Breast cancer Maternal Aunt   . Diabetes Neg Hx   . Heart disease Neg Hx     Review of Systems  Constitutional: Negative for fever.  HENT: Negative for sore throat.   Respiratory: Positive for cough (dry, residual from covid). Negative for shortness of breath and wheezing.   Cardiovascular: Negative for chest pain, palpitations and leg swelling.  Gastrointestinal:       No gerd  Neurological: Negative for light-headedness and headaches.        Objective:   Vitals:   12/20/20 0827  BP: (!) 144/76  Pulse: 92  Temp: 97.9 F (36.6 C)  SpO2: 98%   BP Readings from Last 3 Encounters:  12/20/20 (!) 144/76  12/02/20 131/88  09/19/20 140/76   Wt Readings from Last 3 Encounters:  12/20/20 144 lb 9.6 oz (65.6 kg)  12/01/20 141 lb (64 kg)  09/19/20 147 lb (66.7 kg)   Body mass index is 26.45 kg/m.   Physical Exam    Constitutional: Appears well-developed and well-nourished. No distress.  HENT:  Head: Normocephalic and atraumatic.  Neck: Neck supple. No tracheal deviation present. No thyromegaly present.  No cervical lymphadenopathy Cardiovascular: Normal rate, regular rhythm and normal heart sounds.   No murmur heard. No carotid bruit .  No edema Pulmonary/Chest: Effort normal and breath sounds normal. No respiratory distress. No has no wheezes. No rales.  Skin: Skin is warm and dry. Not diaphoretic.  Psychiatric: Normal mood and affect. Behavior is normal.   Lab Results  Component Value Date   WBC 13.5 (H) 09/17/2020   HGB 13.5 09/17/2020   HCT 40.3 09/17/2020   PLT 345.0 09/17/2020   GLUCOSE 116 (H) 12/19/2020   CHOL 161 12/19/2020   TRIG 336.0 (H) 12/19/2020   HDL 43.30 12/19/2020   LDLDIRECT 80.0 12/19/2020   LDLCALC 87 08/28/2016   ALT 61 (H) 12/19/2020   AST 39 (H) 12/19/2020   NA 138 12/19/2020   K 4.4 12/19/2020   CL 102 12/19/2020   CREATININE 0.62 12/19/2020   BUN 8 12/19/2020   CO2 25 12/19/2020   TSH 2.75 09/17/2020   HGBA1C 7.3 (H) 12/19/2020   MICROALBUR 0.9 08/28/2016      Assessment & Plan:    See Problem List for Assessment and Plan of chronic medical problems.    This visit occurred during the SARS-CoV-2 public health emergency.  Safety protocols were in place, including screening questions prior to the visit, additional usage of staff PPE, and extensive cleaning of exam room while observing appropriate contact time as indicated for disinfecting solutions.

## 2020-12-19 NOTE — Patient Instructions (Addendum)
  Blood work was reviewed.  Your a1c is 7.3%   Medications changes include :   Start farxiga 5 mg daily for your sugar.  Start vascepa for your triglycerides.  Continue all your current medications.    Your prescription(s) have been submitted to your pharmacy. Please take as directed and contact our office if you believe you are having problem(s) with the medication(s).    Please followup in 6 months

## 2020-12-20 ENCOUNTER — Ambulatory Visit: Payer: BC Managed Care – PPO | Admitting: Internal Medicine

## 2020-12-20 ENCOUNTER — Encounter: Payer: Self-pay | Admitting: Internal Medicine

## 2020-12-20 ENCOUNTER — Other Ambulatory Visit: Payer: Self-pay

## 2020-12-20 VITALS — BP 144/76 | HR 92 | Temp 97.9°F | Ht 62.0 in | Wt 144.6 lb

## 2020-12-20 DIAGNOSIS — E790 Hyperuricemia without signs of inflammatory arthritis and tophaceous disease: Secondary | ICD-10-CM | POA: Diagnosis not present

## 2020-12-20 DIAGNOSIS — K219 Gastro-esophageal reflux disease without esophagitis: Secondary | ICD-10-CM | POA: Diagnosis not present

## 2020-12-20 DIAGNOSIS — E1165 Type 2 diabetes mellitus with hyperglycemia: Secondary | ICD-10-CM | POA: Diagnosis not present

## 2020-12-20 DIAGNOSIS — I10 Essential (primary) hypertension: Secondary | ICD-10-CM

## 2020-12-20 DIAGNOSIS — E782 Mixed hyperlipidemia: Secondary | ICD-10-CM

## 2020-12-20 MED ORDER — ICOSAPENT ETHYL 1 G PO CAPS: 2.0000 g | ORAL_CAPSULE | Freq: Two times a day (BID) | ORAL | 5 refills | Status: DC

## 2020-12-20 MED ORDER — BLOOD GLUCOSE TEST VI STRP: ORAL_STRIP | 3 refills | Status: DC

## 2020-12-20 MED ORDER — DAPAGLIFLOZIN PROPANEDIOL 5 MG PO TABS: 5.0000 mg | ORAL_TABLET | Freq: Every day | ORAL | 1 refills | Status: DC

## 2020-12-20 NOTE — Assessment & Plan Note (Signed)
Chronic Lab Results  Component Value Date   HGBA1C 7.3 (H) 12/19/2020   Not ideally controlled Continue metformin XR 1000mg  BID Continue regular exercise, diabetic diet Work on weight loss

## 2020-12-20 NOTE — Assessment & Plan Note (Signed)
Chronic No symptoms Continue allopurinol 300 mg daily

## 2020-12-20 NOTE — Assessment & Plan Note (Signed)
Chronic GERD controlled Continue otc pepcid 20 mg prn

## 2020-12-20 NOTE — Assessment & Plan Note (Addendum)
BP Readings from Last 3 Encounters:  12/20/20 (!) 144/76  12/02/20 131/88  09/19/20 140/76   Chronic BP slightly elevated today but typically better - no change today Continue telmisartan 40 mg daily cmp reviewed

## 2020-12-20 NOTE — Assessment & Plan Note (Signed)
Chronic LDL controlled, trigs high Continue atorvastatin 20 mg daily, start vascepa 2 g BID Regular exercise and healthy diet encouraged

## 2021-01-01 ENCOUNTER — Telehealth: Payer: Self-pay | Admitting: Internal Medicine

## 2021-01-01 NOTE — Telephone Encounter (Signed)
   Patient wants to know if prior Berkley Harvey has been obtained for icosapent Ethyl (VASCEPA) 1 g capsule

## 2021-01-02 NOTE — Telephone Encounter (Signed)
Spoke with patient and she has been informed of approval.

## 2021-01-02 NOTE — Telephone Encounter (Signed)
Prior authorization for Tillman Sers has been approved. RETURN TO DASHBOARD For patients covered by Medicare Part D, you may submit a tier exception form to the plan or PBM. If approved by the plan or PBM, it may reduce the patient's out-of-pocket costs. Type "tier exception" in the "plan or PBM" field on the Form Pick page to view eligible tier exception forms.  Message from plan: Effective from 01/02/2021 through 01/01/2022.

## 2021-01-23 DIAGNOSIS — Z20822 Contact with and (suspected) exposure to covid-19: Secondary | ICD-10-CM | POA: Diagnosis not present

## 2021-01-25 DIAGNOSIS — Z20822 Contact with and (suspected) exposure to covid-19: Secondary | ICD-10-CM | POA: Diagnosis not present

## 2021-03-01 ENCOUNTER — Other Ambulatory Visit: Payer: Self-pay | Admitting: Internal Medicine

## 2021-03-07 ENCOUNTER — Other Ambulatory Visit: Payer: Self-pay | Admitting: Internal Medicine

## 2021-03-16 ENCOUNTER — Other Ambulatory Visit: Payer: Self-pay | Admitting: Internal Medicine

## 2021-04-19 DIAGNOSIS — Z01411 Encounter for gynecological examination (general) (routine) with abnormal findings: Secondary | ICD-10-CM | POA: Diagnosis not present

## 2021-04-30 ENCOUNTER — Encounter: Payer: Self-pay | Admitting: General Practice

## 2021-05-29 ENCOUNTER — Encounter: Payer: Self-pay | Admitting: Obstetrics & Gynecology

## 2021-05-29 ENCOUNTER — Other Ambulatory Visit: Payer: Self-pay

## 2021-05-29 ENCOUNTER — Ambulatory Visit: Payer: BC Managed Care – PPO | Admitting: Obstetrics & Gynecology

## 2021-05-29 VITALS — BP 129/66 | HR 86 | Ht 62.0 in | Wt 140.0 lb

## 2021-05-29 DIAGNOSIS — N393 Stress incontinence (female) (male): Secondary | ICD-10-CM | POA: Diagnosis not present

## 2021-05-29 DIAGNOSIS — N814 Uterovaginal prolapse, unspecified: Secondary | ICD-10-CM | POA: Diagnosis not present

## 2021-05-29 NOTE — Patient Instructions (Signed)
Vaginal Hysterectomy  A vaginal hysterectomy is a procedure to remove all or part of the uterus through a small incision in the vagina. In this procedure, your health care provider may remove your entire uterus, including the cervix. The cervix is the opening and bottom part of the uterus and is located between the vagina and theuterus. Sometimes, the ovaries and fallopian tubes are also removed. This surgery may be done to treat problems such as: Noncancerous growths in the uterus (uterine fibroids) that cause symptoms. A condition that causes the lining of the uterus to grow in other areas (endometriosis). Problems with pelvic support. Cancer of the cervix, ovaries, uterus, or tissue that lines the uterus (endometrium). Excessive bleeding in the uterus. When removing your uterus, your health care provider may also remove the ovaries and the fallopian tubes. After this procedure, you will no longer beable to have a baby, and you will no longer have a menstrual period. Tell a health care provider about: Any allergies you have. All medicines you are taking, including vitamins, herbs, eye drops, creams, and over-the-counter medicines. Any problems you or family members have had with anesthetic medicines. Any blood disorders you have. Any surgeries you have had. Any medical conditions you have. Whether you are pregnant or may be pregnant. What are the risks? Generally, this is a safe procedure. However, problems may occur, including: Bleeding. Infection. Blood clots in the legs or lungs. Damage to nearby structures or organs. Pain during sex. Allergic reactions to medicines. What happens before the procedure? Staying hydrated Follow instructions from your health care provider about hydration, which may include: Up to 2 hours before the procedure - you may continue to drink clear liquids, such as water, clear fruit juice, black coffee, and plain tea.  Eating and drinking  restrictions Follow instructions from your health care provider about eating and drinking, which may include: 8 hours before the procedure - stop eating heavy meals or foods, such as meat, fried foods, or fatty foods. 6 hours before the procedure - stop eating light meals or foods, such as toast or cereal. 6 hours before the procedure - stop drinking milk or drinks that contain milk. 2 hours before the procedure - stop drinking clear liquids. Medicines Ask your health care provider about: Changing or stopping your regular medicines. This is especially important if you are taking diabetes medicines or blood thinners. Taking medicines such as aspirin and ibuprofen. These medicines can thin your blood. Do not take these medicines unless your health care provider tells you to take them. Taking over-the-counter medicines, vitamins, herbs, and supplements. You may be asked to take a medicine to empty your colon (bowel preparation). General instructions If you were asked to do a bowel preparation before the procedure, follow instructions from your health care provider. This procedure can affect the way you feel about yourself. Talk with your health care provider about the physical and emotional changes hysterectomy may cause. Do not use any products that contain nicotine or tobacco for at least 4 weeks before the procedure. These products include cigarettes, e-cigarettes, and chewing tobacco. If you need help quitting, ask your health care provider. Plan to have a responsible adult take you home from the hospital or clinic. Plan to have a responsible adult care for you for the time you are told after you leave the hospital or clinic. This is important. Surgery safety Ask your health care provider: How your surgery site will be marked. What steps will be taken to help  prevent infection. These may include: Removing hair at the surgery site. Washing skin with a germ-killing soap. Receiving antibiotic  medicine. What happens during the procedure? An IV will be inserted into one of your veins. You will be given one or more of the following: A medicine to help you relax (sedative). A medicine to numb the area (local anesthetic). A medicine to make you fall asleep (general anesthetic). A medicine that is injected into your spine to numb the area below and slightly above the injection site (spinal anesthetic). A medicine that is injected into an area of your body to numb everything below the injection site (regional anesthetic). Your surgeon will make an incision in your vagina. Your surgeon will locate and remove all or part of your uterus. Part or all of the uterus will be removed through the vagina. Your ovaries and fallopian tubes may be removed at the same time. The incision in your vagina will be closed with stitches (sutures) that dissolve over time. The procedure may vary among health care providers and hospitals. What happens after the procedure? Your blood pressure, heart rate, breathing rate, and blood oxygen level will be monitored until you leave the hospital or clinic. You will be encouraged to walk as soon as possible. You will also use a device or do breathing exercises to keep your lungs clear. You may have to wear compression stockings. These stockings help to prevent blood clots and reduce swelling in your legs. You will be given pain medicine as needed. You will need to wear a sanitary pad for vaginal discharge or bleeding. Summary A vaginal hysterectomy is a procedure to remove all or part of the uterus through the vagina. You may need a vaginal hysterectomy to treat a variety of abnormalities of the uterus. Plan to have a responsible adult take you home from the hospital or clinic. Plan to have a responsible adult care for you for the time you are told after you leave the hospital or clinic. This is important. This information is not intended to replace advice given to  you by your health care provider. Make sure you discuss any questions you have with your healthcare provider. Document Revised: 07/20/2020 Document Reviewed: 07/20/2020 Elsevier Patient Education  2022 [employer] Vaginal Hysterectomy, Care After The following information offers guidance on how to care for yourself after your procedure. Your health care provider may also give you more specific instructions. If you have problems or questions, contact your health careprovider. What can I expect after the procedure? After the procedure, it is common to have: Pain in the lower abdomen and vagina. Vaginal bleeding and discharge for up to 1 week. You will need to use a sanitary pad after this procedure. Difficulty having a bowel movement (constipation). Temporary problems emptying the bladder. Tiredness (fatigue). Poor appetite. Less interest in sex. Feelings of sadness or other emotions. If your ovaries were also removed, it is also common to have symptoms of menopause, such as hot flashes, night sweats, and lack of sleep (insomnia). Follow these instructions at home: Medicines  Take over-the-counter and prescription medicines only as told by your health care provider. Do not take aspirin or NSAIDs, such as ibuprofen. These medicines can cause bleeding. Ask your health care provider if the medicine prescribed to you: Requires you to avoid driving or using machinery. Can cause constipation. You may need to take these actions to prevent or treat constipation: Drink enough fluid to keep your urine pale yellow. Take over-the-counter or prescription  medicines. Eat foods that are high in fiber, such as beans, whole grains, and fresh fruits and vegetables. Limit foods that are high in fat and processed sugars, such as fried or sweet foods.  Activity  Rest as told by your health care provider. Return to your normal activities as told by your health care provider. Ask your health care provider  what activities are safe for you Avoid sitting for a long time without moving. Get up to take short walks every 1-2 hours. This is important to improve blood flow and breathing. Ask for help if you feel weak or unsteady. Try to have someone home with you for 1-2 weeks to help you with everyday chores. Do not lift anything that is heavier than 10 lb (4.5 kg), or the limit that you are told, until your health care provider says that it is safe. If you were given a sedative during the procedure, it can affect you for several hours. Do not drive or operate machinery until your health care provider says that it is safe.  Lifestyle Do not use any products that contain nicotine or tobacco. These products include cigarettes, chewing tobacco, and vaping devices, such as e-cigarettes. These can delay healing after surgery. If you need help quitting, ask your health care provider. Do not drink alcohol until your health care provider approves. General instructions Do not douche, use tampons, or have sex for at least 6 weeks, or as told by your health care provider. If you struggle with physical or emotional changes after your procedure, speak with your health care provider or a therapist. The stitches inside your vagina will dissolve over time and do not need to be taken out. Do not take baths, swim, or use a hot tub until your health care provider approves. You may only be allowed to take showers for 2-3 weeks Wear compression stockings as told by your health care provider. These stockings help to prevent blood clots and reduce swelling in your legs. Keep all follow-up visits. This is important. Contact a health care provider if: Your pain medicine is not helping. You have a fever. You have nausea or vomiting that does not go away. You feel dizzy. You have blood, pus, or a bad-smelling discharge from your vagina more than 1 week after the procedure. You continue to have trouble urinating 3-5 days after  the procedure. Get help right away if: You have severe pain in your abdomen or back. You faint. You have heavy vaginal bleeding and blood clots, soaking through a sanitary pad in less than 1 hour. You have chest pain or shortness of breath. You have pain, swelling, or redness in your leg. These symptoms may represent a serious problem that is an emergency. Do not wait to see if the symptoms will go away. Get medical help right away. Call your local emergency services (911 in the [place] Do not drive yourself to the hospital. Summary After the procedure, it is common to have pain, vaginal bleeding, constipation, temporary problems emptying your bladder, and feelings of sadness or other emotions. Take over-the-counter and prescription medicines only as told by your health care provider. Rest as told by your health care provider. Return to your normal activities as told by your health care provider. Contact a health care provider if your pain medicine is not helping, or you have a fever, dizziness, or trouble urinating several days after the procedure. Get help right away if you have severe pain in your abdomen or back, or  if you faint, have heavy bleeding, or have chest pain or shortness of breath. This information is not intended to replace advice given to you by your health care provider. Make sure you discuss any questions you have with your healthcare provider. Document Revised: 07/20/2020 Document Reviewed: 07/20/2020 Elsevier Patient Education  2022 [employer].

## 2021-05-29 NOTE — Progress Notes (Signed)
Subjective:     Savannah Rogers is a 55 y.o. female here for a routine exam.  LMP 4 years prev. Pt is a [id number] with a h/o a vag delivery at home. Current complaints: pt referred by Dr. June Leap for uterine prolapse. Pt has already had her PAP and annual GYN exam. Pt has been using a pessary for 5 years. During the pandemic she was only able to go to the doc annually and she is unable to remove the pessary herself. She reports that she occ leaks urine and does report that the leakage is greater when the pessary is in than when it is out.       Gynecologic History No LMP recorded (lmp unknown). Patient is postmenopausal. Contraception: post menopausal status Last Pap: 2022 with Dr. Neva Seat. This is [facility].  Last mammogram: 11/11/2020. Results were: normal  Obstetric History SABx2 and SVD x1  The following portions of the patient's history were reviewed and updated as appropriate: allergies, current medications, past family history, past medical history, past social history, past surgical history, and problem list.  Review of Systems Pertinent items are noted in HPI.    Objective:  BP 129/66 (BP Location: Right Arm, Patient Position: Sitting, Cuff Size: Normal)   Pulse 86   Ht 5\' 2"  (1.575 m)   Wt 140 lb (63.5 kg)   LMP  (LMP Unknown) Comment: LMP 2018  BMI 25.61 kg/m   CONSTITUTIONAL: Well-developed, well-nourished female in no acute distress.  HENT:  Normocephalic, atraumatic EYES: Conjunctivae and EOM are normal. No scleral icterus.  NECK: Normal range of motion SKIN: Skin is warm and dry. No rash noted. Not diaphoretic.No pallor. NEUROLGIC: Alert and oriented to person, place, and time. Normal coordination.  GU: EGBUS: no lesions Vagina: no blood in vault Cervix: no lesion; no mucopurulent d/c Uterus: grade 3 uterine prolapse. The urethra is suspended however there is a grade 2 cystocele.  RV: no rectal prolapse or rectocele noted.  Adnexa: no masses; non tender.       Assessment:  Uterine prolapse with SUI.     Plan:   Patient desires surgical management with TVH and bilateral salpingectomy. Pt will need a urologic procedure. Possible a bladder neck suspension. I have discussed performing the case with Dr. . Given the timing she may complete the entire procedure after eval if she can get the pt into surgery in Aug. The risks of surgery were discussed in detail with the patient including but not limited to: bleeding which may require transfusion or reoperation; infection which may require prolonged hospitalization or re-hospitalization and antibiotic therapy; injury to bowel, bladder, ureters and major vessels or other surrounding organs; need for additional procedures including laparotomy; thromboembolic phenomenon, incisional problems and other postoperative or anesthesia complications.  Patient was told that the likelihood that her condition and symptoms will be treated effectively with this surgical management was very high; the postoperative expectations were also discussed in detail. The patient also understands the alternative treatment options which were discussed in full. All questions were answered.  She was told that she will be contacted by our surgical scheduler regarding the time and date of her surgery; routine preoperative instructions of having nothing to eat or drink after midnight on the day prior to surgery and also coming to the hospital 1 1/2 hours prior to her time of surgery were also emphasized.  She was told she may be called for a preoperative appointment about a week prior to surgery and will be  given further preoperative instructions at that visit. Printed patient education handouts about the procedure were given to the patient to review at home.   Kayliegh Boyers L. Harraway-Smith, M.D., [facility]

## 2021-05-29 NOTE — Progress Notes (Signed)
Pt presents today as new patient for uterine prolapse. Referred from Dr. Neva Seat.

## 2021-05-31 ENCOUNTER — Other Ambulatory Visit: Payer: Self-pay | Admitting: Internal Medicine

## 2021-06-04 ENCOUNTER — Telehealth: Payer: Self-pay | Admitting: *Deleted

## 2021-06-04 NOTE — Telephone Encounter (Signed)
Call to patient. Advised Dr Florian Buff will be returning to office late July and will review patients chart and exam notes from Dr Erin Fulling. We will then call her back with surgical plan.  Given name and contact info for surgery scheduling.  Encounter closed.

## 2021-06-19 ENCOUNTER — Other Ambulatory Visit (INDEPENDENT_AMBULATORY_CARE_PROVIDER_SITE_OTHER): Payer: BC Managed Care – PPO

## 2021-06-19 ENCOUNTER — Other Ambulatory Visit: Payer: Self-pay

## 2021-06-19 DIAGNOSIS — I10 Essential (primary) hypertension: Secondary | ICD-10-CM

## 2021-06-19 DIAGNOSIS — E790 Hyperuricemia without signs of inflammatory arthritis and tophaceous disease: Secondary | ICD-10-CM

## 2021-06-19 DIAGNOSIS — E1165 Type 2 diabetes mellitus with hyperglycemia: Secondary | ICD-10-CM

## 2021-06-19 DIAGNOSIS — E782 Mixed hyperlipidemia: Secondary | ICD-10-CM

## 2021-06-19 LAB — COMPREHENSIVE METABOLIC PANEL
ALT: 34 U/L (ref 0–35)
AST: 25 U/L (ref 0–37)
Albumin: 4.3 g/dL (ref 3.5–5.2)
Alkaline Phosphatase: 76 U/L (ref 39–117)
BUN: 14 mg/dL (ref 6–23)
CO2: 25 mEq/L (ref 19–32)
Calcium: 10 mg/dL (ref 8.4–10.5)
Chloride: 100 mEq/L (ref 96–112)
Creatinine, Ser: 0.67 mg/dL (ref 0.40–1.20)
GFR: 97.68 mL/min (ref >60.00)
Glucose, Bld: 122 mg/dL — ABNORMAL HIGH (ref 70–99)
Potassium: 4.1 mEq/L (ref 3.5–5.1)
Sodium: 137 mEq/L (ref 135–145)
Total Bilirubin: 0.5 mg/dL (ref 0.2–1.2)
Total Protein: 7.6 g/dL (ref 6.0–8.3)

## 2021-06-19 LAB — CBC WITH DIFFERENTIAL/PLATELET
Basophils Absolute: 0.1 10*3/uL (ref 0.0–0.1)
Basophils Relative: 0.9 % (ref 0.0–3.0)
Eosinophils Absolute: 0.2 10*3/uL (ref 0.0–0.7)
Eosinophils Relative: 1.9 % (ref 0.0–5.0)
HCT: 41.9 % (ref 36.0–46.0)
Hemoglobin: 14.2 g/dL (ref 12.0–15.0)
Lymphocytes Relative: 34.4 % (ref 12.0–46.0)
Lymphs Abs: 4.3 10*3/uL — ABNORMAL HIGH (ref 0.7–4.0)
MCHC: 34 g/dL (ref 30.0–36.0)
MCV: 85.5 fl (ref 78.0–100.0)
Monocytes Absolute: 0.6 10*3/uL (ref 0.1–1.0)
Monocytes Relative: 4.5 % (ref 3.0–12.0)
Neutro Abs: 7.4 10*3/uL (ref 1.4–7.7)
Neutrophils Relative %: 58.3 % (ref 43.0–77.0)
Platelets: 350 10*3/uL (ref 150.0–400.0)
RBC: 4.9 Mil/uL (ref 3.87–5.11)
RDW: 13.6 % (ref 11.5–15.5)
WBC: 12.6 10*3/uL — ABNORMAL HIGH (ref 4.0–10.5)

## 2021-06-19 LAB — TSH: TSH: 2.6 u[IU]/mL (ref 0.35–5.50)

## 2021-06-19 LAB — LIPID PANEL
Cholesterol: 137 mg/dL (ref 0–200)
HDL: 42.6 mg/dL (ref >39.00)
NonHDL: 94.04
Total CHOL/HDL Ratio: 3
Triglycerides: 254 mg/dL — ABNORMAL HIGH (ref 0.0–149.0)
VLDL: 50.8 mg/dL — ABNORMAL HIGH (ref 0.0–40.0)

## 2021-06-19 LAB — HEMOGLOBIN A1C: Hgb A1c MFr Bld: 7.3 % — ABNORMAL HIGH (ref 4.6–6.5)

## 2021-06-19 LAB — LDL CHOLESTEROL, DIRECT: Direct LDL: 67 mg/dL

## 2021-06-19 LAB — URIC ACID: Uric Acid, Serum: 3.7 mg/dL (ref 2.4–7.0)

## 2021-06-22 NOTE — Patient Instructions (Addendum)
Medications changes include :   increase farxiga to 10 mg daily, decrease allopurinol to 100 mg  Your prescription(s) have been submitted to your pharmacy. Please take as directed and contact our office if you believe you are having problem(s) with the medication(s).    Please followup in 6 months     Health Maintenance, Female Adopting a healthy lifestyle and getting preventive care are important in promoting health and wellness. Ask your health care provider about: The right schedule for you to have regular tests and exams. Things you can do on your own to prevent diseases and keep yourself healthy. What should I know about diet, weight, and exercise? Eat a healthy diet  Eat a diet that includes plenty of vegetables, fruits, low-fat dairy products, and lean protein. Do not eat a lot of foods that are high in solid fats, added sugars, or sodium.  Maintain a healthy weight Body mass index (BMI) is used to identify weight problems. It estimates body fat based on height and weight. Your health care provider can help determineyour BMI and help you achieve or maintain a healthy weight. Get regular exercise Get regular exercise. This is one of the most important things you can do for your health. Most adults should: Exercise for at least 150 minutes each week. The exercise should increase your heart rate and make you sweat (moderate-intensity exercise). Do strengthening exercises at least twice a week. This is in addition to the moderate-intensity exercise. Spend less time sitting. Even light physical activity can be beneficial. Watch cholesterol and blood lipids Have your blood tested for lipids and cholesterol at 56 years of age, then havethis test every 5 years. Have your cholesterol levels checked more often if: Your lipid or cholesterol levels are high. You are older than 56 years of age. You are at high risk for heart disease. What should I know about cancer  screening? Depending on your health history and family history, you may need to have cancer screening at various ages. This may include screening for: Breast cancer. Cervical cancer. Colorectal cancer. Skin cancer. Lung cancer. What should I know about heart disease, diabetes, and high blood pressure? Blood pressure and heart disease High blood pressure causes heart disease and increases the risk of stroke. This is more likely to develop in people who have high blood pressure readings, are of African descent, or are overweight. Have your blood pressure checked: Every 3-5 years if you are 47-25 years of age. Every year if you are 35 years old or older. Diabetes Have regular diabetes screenings. This checks your fasting blood sugar level. Have the screening done: Once every three years after age 32 if you are at a normal weight and have a low risk for diabetes. More often and at a younger age if you are overweight or have a high risk for diabetes. What should I know about preventing infection? Hepatitis B If you have a higher risk for hepatitis B, you should be screened for this virus. Talk with your health care provider to find out if you are at risk forhepatitis B infection. Hepatitis C Testing is recommended for: Everyone born from [birth year] through 1966. Anyone with known risk factors for hepatitis C. Sexually transmitted infections (STIs) Get screened for STIs, including gonorrhea and chlamydia, if: You are sexually active and are younger than 56 years of age. You are older than 56 years of age and your health care provider tells you that you are at risk for this  type of infection. Your sexual activity has changed since you were last screened, and you are at increased risk for chlamydia or gonorrhea. Ask your health care provider if you are at risk. Ask your health care provider about whether you are at high risk for HIV. Your health care provider may recommend a prescription medicine to  help prevent HIV infection. If you choose to take medicine to prevent HIV, you should first get tested for HIV. You should then be tested every 3 months for as long as you are taking the medicine. Pregnancy If you are about to stop having your period (premenopausal) and you may become pregnant, seek counseling before you get pregnant. Take 400 to 800 micrograms (mcg) of folic acid every day if you become pregnant. Ask for birth control (contraception) if you want to prevent pregnancy. Osteoporosis and menopause Osteoporosis is a disease in which the bones lose minerals and strength with aging. This can result in bone fractures. If you are 15 years old or older, or if you are at risk for osteoporosis and fractures, ask your health care provider if you should: Be screened for bone loss. Take a calcium or vitamin D supplement to lower your risk of fractures. Be given hormone replacement therapy (HRT) to treat symptoms of menopause. Follow these instructions at home: Lifestyle Do not use any products that contain nicotine or tobacco, such as cigarettes, e-cigarettes, and chewing tobacco. If you need help quitting, ask your health care provider. Do not use street drugs. Do not share needles. Ask your health care provider for help if you need support or information about quitting drugs. Alcohol use Do not drink alcohol if: Your health care provider tells you not to drink. You are pregnant, may be pregnant, or are planning to become pregnant. If you drink alcohol: Limit how much you use to 0-1 drink a day. Limit intake if you are breastfeeding. Be aware of how much alcohol is in your drink. In the [place] one drink equals one 12 oz bottle of beer (355 mL), one 5 oz glass of wine (148 mL), or one 1 oz glass of hard liquor (44 mL). General instructions Schedule regular health, dental, and eye exams. Stay current with your vaccines. Tell your health care provider if: You often feel depressed. You  have ever been abused or do not feel safe at home. Summary Adopting a healthy lifestyle and getting preventive care are important in promoting health and wellness. Follow your health care provider's instructions about healthy diet, exercising, and getting tested or screened for diseases. Follow your health care provider's instructions on monitoring your cholesterol and blood pressure. This information is not intended to replace advice given to you by your health care provider. Make sure you discuss any questions you have with your healthcare provider. Document Revised: 11/10/2018 Document Reviewed: 11/10/2018 Elsevier Patient Education  2022 [employer].

## 2021-06-22 NOTE — Progress Notes (Signed)
Subjective:    Patient ID: Savannah Rogers, female    DOB: 03/23/1966, 55 y.o.   MRN: [id number]  HPI The patient is here for a physical exam and follow up of their chronic medical problems, including htn, DM, hyperuricemia, hi chol  Sugar in am usually < 100.  Medications and allergies reviewed with patient and updated if appropriate.  Patient Active Problem List   Diagnosis Date Noted   Brittle nails 06/24/2021   Second degree burn of nose, initial encounter 30/16/0109   Umbilical hernia without obstruction and without gangrene 12/14/2018   Gastroesophageal reflux disease 12/14/2018   Diabetes ([place]) 09/09/2017   Pancreatitis 03/03/2017   Abnormal CXR 04/19/2016   Upper airway cough syndrome likely related to allergic rhinitis  04/10/2016   Essential hypertension, benign 03/05/2016   Non-allergic rhinitis 06/13/2015   Vitamin D deficiency 05/22/2015   Hyperuricemia 03/24/2013   Mixed hyperlipidemia 03/06/2008    Current Outpatient Medications on File Prior to Visit  Medication Sig Dispense Refill   atorvastatin (LIPITOR) 20 MG tablet TAKE 1 TABLET BY MOUTH EVERY DAY 90 tablet 1   fluticasone (FLONASE) 50 MCG/ACT nasal spray Place into both nostrils.     Glucose Blood (BLOOD GLUCOSE TEST STRIPS) STRP One Touch. Test sugars once daily as directed. E11.9 90 strip 3   icosapent Ethyl (VASCEPA) 1 g capsule Take 2 capsules (2 g total) by mouth 2 (two) times daily. 120 capsule 5   metFORMIN (GLUCOPHAGE-XR) 500 MG 24 hr tablet TAKE 2 TABLETS (1,000 MG TOTAL) BY MOUTH 2 (TWO) TIMES DAILY BEFORE A MEAL. 360 tablet 1   silver sulfADIAZINE (SILVADENE) 1 % cream Apply 1 application topically daily. 50 g 0   telmisartan (MICARDIS) 40 MG tablet TAKE 1 TABLET BY MOUTH EVERY DAY 90 tablet 1   [DISCONTINUED] allopurinol (ZYLOPRIM) 300 MG tablet TAKE 1 TABLET BY MOUTH EVERY DAY Annual appt is due must see provider for future refills 30 tablet 0   No current facility-administered medications on  file prior to visit.    Past Medical History:  Diagnosis Date   Diabetes mellitus without complication ([place])    Hyperlipidemia    NMR 05-2010:ldl 55(1415/711),HDL 48, TG 173. NO FH of MI.[place] study LDL goal  LDL goal =<160   Hypertension    Hyperuricemia     Past Surgical History:  Procedure Laterality Date   BONE MARROW BIOPSY  2006   for elevated WBC   CHOLECYSTECTOMY  03/16/2012   INTRAOPERATIVE CHOLANGIOGRAM;  Surgeon: Earnstine Regal, MD;  Location: [facility];  Service: General;  Laterality: N/A;   G3  P1  A2     laparascopic cholecystectomy  03/2012   Dr Harlow Asa    Social History   Socioeconomic History   Marital status: Single    Spouse name: Not on file   Number of children: 1   Years of education: Not on file   Highest education level: Not on file  Occupational History   Occupation:  HOTEL MANAGER    Employer: [employer]   Occupation: Best boy: [employer]  Tobacco Use   Smoking status: Never   Smokeless tobacco: Never  Substance and Sexual Activity   Alcohol use: No    Alcohol/week: 0.0 standard drinks   Drug use: No   Sexual activity: Not on file  Other Topics Concern   Not on file  Social History Narrative   Not on file   Social Determinants of Health  Financial Resource Strain: Not on file  Food Insecurity: Not on file  Transportation Needs: Not on file  Physical Activity: Not on file  Stress: Not on file  Social Connections: Not on file    Family History  Problem Relation Age of Onset   Stroke Paternal Grandmother 4   Stroke Paternal Grandfather 90   Hypertension Father    Hypertension Mother    Breast cancer Maternal Aunt    Diabetes Neg Hx    Heart disease Neg Hx     Review of Systems  Constitutional:  Negative for chills and fever.  Eyes:  Negative for visual disturbance.  Respiratory:  Negative for cough, shortness of breath and wheezing.   Cardiovascular:  Negative for chest pain, palpitations and leg swelling.   Gastrointestinal:  Positive for nausea (if she eats late at night). Negative for abdominal pain, blood in stool, constipation and diarrhea.       No gerd  Genitourinary:  Negative for dysuria and hematuria.  Musculoskeletal:  Positive for arthralgias. Negative for back pain.  Skin:  Negative for color change and rash.  Neurological:  Negative for light-headedness and headaches.  Psychiatric/Behavioral:  Negative for dysphoric mood. The patient is not nervous/anxious.       Objective:   Vitals:   06/24/21 0940  BP: 122/74  Pulse: 86  Temp: 98.3 F (36.8 C)  SpO2: 98%   BP Readings from Last 3 Encounters:  06/24/21 122/74  05/29/21 129/66  12/20/20 (!) 144/76   Wt Readings from Last 3 Encounters:  06/24/21 139 lb 6.4 oz (63.2 kg)  05/29/21 140 lb (63.5 kg)  12/20/20 144 lb 9.6 oz (65.6 kg)   Body mass index is 25.5 kg/m.   Physical Exam    Constitutional: She appears well-developed and well-nourished. No distress.  HENT:  Head: Normocephalic and atraumatic.  Right Ear: External ear normal. Normal ear canal and TM Left Ear: External ear normal.  Normal ear canal and TM Mouth/Throat: Oropharynx is clear and moist.  Eyes: Conjunctivae and EOM are normal.  Neck: Neck supple. No tracheal deviation present. No thyromegaly present.  No carotid bruit  Cardiovascular: Normal rate, regular rhythm and normal heart sounds.   No murmur heard.  No edema. Pulmonary/Chest: Effort normal and breath sounds normal. No respiratory distress. She has no wheezes. She has no rales.  Breast: deferred to Gyn Abdominal: Soft. She exhibits no distension. There is no tenderness.  Lymphadenopathy: She has no cervical adenopathy.  Skin: Skin is warm and dry. She is not diaphoretic.  Psychiatric: She has a normal mood and affect. Her behavior is normal.       Assessment & Plan:    Physical exam: Screening blood work   reviewed Immunizations   advised shingrix, discussed covid  booster Colonoscopy  Up to date  Mammogram   Up to date  Gyn   Up to date  Eye exam  due - will get done Exercise walking some Weight   working on weight loss Substance abuse   none    See Problem List for Assessment and Plan of chronic medical problems.    This visit occurred during the SARS-CoV-2 public health emergency.  Safety protocols were in place, including screening questions prior to the visit, additional usage of staff PPE, and extensive cleaning of exam room while observing appropriate contact time as indicated for disinfecting solutions.

## 2021-06-24 ENCOUNTER — Ambulatory Visit: Payer: BC Managed Care – PPO | Admitting: Internal Medicine

## 2021-06-24 ENCOUNTER — Encounter: Payer: Self-pay | Admitting: Internal Medicine

## 2021-06-24 ENCOUNTER — Other Ambulatory Visit: Payer: Self-pay

## 2021-06-24 VITALS — BP 122/74 | HR 86 | Temp 98.3°F | Ht 62.0 in | Wt 139.4 lb

## 2021-06-24 DIAGNOSIS — E559 Vitamin D deficiency, unspecified: Secondary | ICD-10-CM | POA: Diagnosis not present

## 2021-06-24 DIAGNOSIS — Z0000 Encounter for general adult medical examination without abnormal findings: Secondary | ICD-10-CM

## 2021-06-24 DIAGNOSIS — E782 Mixed hyperlipidemia: Secondary | ICD-10-CM

## 2021-06-24 DIAGNOSIS — Z1159 Encounter for screening for other viral diseases: Secondary | ICD-10-CM

## 2021-06-24 DIAGNOSIS — E790 Hyperuricemia without signs of inflammatory arthritis and tophaceous disease: Secondary | ICD-10-CM

## 2021-06-24 DIAGNOSIS — E1165 Type 2 diabetes mellitus with hyperglycemia: Secondary | ICD-10-CM

## 2021-06-24 DIAGNOSIS — I10 Essential (primary) hypertension: Secondary | ICD-10-CM | POA: Diagnosis not present

## 2021-06-24 DIAGNOSIS — L603 Nail dystrophy: Secondary | ICD-10-CM

## 2021-06-24 MED ORDER — DAPAGLIFLOZIN PROPANEDIOL 10 MG PO TABS: 10.0000 mg | ORAL_TABLET | Freq: Every day | ORAL | 1 refills | Status: DC

## 2021-06-24 MED ORDER — ALLOPURINOL 100 MG PO TABS
100.0000 mg | ORAL_TABLET | Freq: Every day | ORAL | 1 refills | Status: DC
Start: 2021-06-24 — End: 2021-12-31

## 2021-06-24 NOTE — Assessment & Plan Note (Signed)
Chronic Continue atorvastatin 20 mg daily LDL well controlled, triglycerides still elevated Continue Vascepa 2 g twice daily Stressed regular exercise

## 2021-06-24 NOTE — Assessment & Plan Note (Signed)
Chronic Uric acid well-controlled Will decrease allopurinol from 300 mg daily to 100 mg daily Will check uric acid level at next visit

## 2021-06-24 NOTE — Assessment & Plan Note (Addendum)
Chronic A1c 7.3%-not ideally controlled Sugars well controlled in the morning, but likely elevated after meals-advised that she start checking 2 hours after meal Increase Farxiga to 10 mg daily Continue metformin 1000 mg XR twice daily Continue walking-stressed regular exercise She has lost some weight by watching what she is eating Diabetic diet stressed

## 2021-06-24 NOTE — Assessment & Plan Note (Signed)
Chronic We will check vitamin D level at her next visit

## 2021-06-24 NOTE — Assessment & Plan Note (Addendum)
Acute ? Vitamin def Will order B12, d levels for her next visit-can add a calcium-vitamin D supplement and B complex to see if that helps

## 2021-06-24 NOTE — Assessment & Plan Note (Signed)
Chronic BP well controlled Continue telmisartan 40 mg daily cmp reviewed

## 2021-06-26 ENCOUNTER — Other Ambulatory Visit: Payer: Self-pay | Admitting: Internal Medicine

## 2021-06-26 NOTE — Progress Notes (Signed)
[facility] New Patient Evaluation and Consultation  Referring Provider: Lavonia Drafts* PCP: Binnie Rail, MD Date of Service: 06/28/2021  SUBJECTIVE Chief Complaint: New Patient (Initial Visit) Savannah Rogers is a 55 y.o. female here for a consult on prolapse surgery.)  History of Present Illness: Savannah Rogers is a 55 y.o. American [place] and [place] Native female seen in consultation at the request of Dr. Ihor Dow for evaluation of prolapse.    Review of records significant for: Patient has been using a pessary and is interested in surgery. Has urinary leakage more with the pessary in place. Planning TVH with Dr Ihor Dow.   Last Hgb A1c 06/19/21: 7.3  Urinary Symptoms: Leaks urine with cough/ sneeze Leaks 1-2 time(s) per day.  Pad use: none She is bothered by her UI symptoms.  Day time voids every few hours.  Nocturia: 0-1 times per night to void. Voiding dysfunction: she empties her bladder well.  does not use a catheter to empty bladder.  When urinating, she feels she has no difficulties  UTIs: 1 UTI's in the last year.   Denies history of blood in urine and kidney or bladder stones  Pelvic Organ Prolapse Symptoms:                  She Admits to a feeling of a bulge the vaginal area. It has been present for years She Admits to seeing a bulge.  This bulge is bothersome. Used pessary for several years but feels she no longer wants to deal with it.   Bowel Symptom: Bowel movements: 1 time(s) per day Stool consistency: soft  Straining: no.  Splinting: no.  Incomplete evacuation: no.  She Denies accidental bowel leakage / fecal incontinence Bowel regimen: fiber Last colonoscopy: Date 2016, Results negative  Sexual Function Sexually active: yes.  Pain with sex: No  Pelvic Pain Denies pelvic pain   Past Medical History:  Past Medical History:  Diagnosis Date   Diabetes mellitus without complication ([place])    Hyperlipidemia     NMR 05-2010:ldl 55(1415/711),HDL 48, TG 173. NO FH of MI.[place] study LDL goal  LDL goal =<160   Hypertension    Hyperuricemia      Past Surgical History:   Past Surgical History:  Procedure Laterality Date   BONE MARROW BIOPSY  2006   for elevated WBC   CHOLECYSTECTOMY  03/16/2012   INTRAOPERATIVE CHOLANGIOGRAM;  Surgeon: Earnstine Regal, MD;  Location: [facility];  Service: General;  Laterality: N/A;   G3  P1  A2     laparascopic cholecystectomy  03/2012   Dr Harlow Asa     Past OB/GYN History: OB History  Gravida Para Term Preterm AB Living  1         1  SAB IAB Ectopic Multiple Live Births          1    # Outcome Date GA Lbr Len/2nd Weight Sex Delivery Anes PTL Lv  1 Gravida            SVD x1 Menopausal: Yes, denies recent vaginal bleeding  Last pap smear was 07/2018- negative.     Medications: She has a current medication list which includes the following prescription(s): allopurinol, atorvastatin, dapagliflozin propanediol, blood glucose test strips, icosapent ethyl, metformin, telmisartan, and fluticasone.   Allergies: Patient has No Known Allergies.   Social History:  Social History   Tobacco Use   Smoking status: Never   Smokeless tobacco: Never  Vaping Use   Vaping Use:  Never used  Substance Use Topics   Alcohol use: No    Alcohol/week: 0.0 standard drinks   Drug use: No    She lives with family.   She is employedTeacher, music.   Family History:   Family History  Problem Relation Age of Onset   Stroke Paternal Grandmother 50   Stroke Paternal Grandfather 2   Hypertension Father    Hypertension Mother    Breast cancer Maternal Aunt    Diabetes Neg Hx    Heart disease Neg Hx      Review of Systems: Review of Systems  Constitutional:  Negative for fever, malaise/fatigue and weight loss.  Respiratory:  Negative for cough, shortness of breath and wheezing.   Cardiovascular:  Negative for chest pain, palpitations and leg swelling.   Gastrointestinal:  Negative for abdominal pain and blood in stool.  Genitourinary:  Negative for dysuria.  Musculoskeletal:  Negative for myalgias.  Skin:  Negative for rash.  Neurological:  Negative for dizziness and headaches.  Endo/Heme/Allergies:  Does not bruise/bleed easily.  Psychiatric/Behavioral:  Negative for depression. The patient is not nervous/anxious.     OBJECTIVE Physical Exam: Vitals:   06/28/21 1137  BP: 123/79  Pulse: 89  Weight: 139 lb (63 kg)  Height: _0  (1.575 m)    Physical Exam Constitutional:      General: She is not in acute distress. Pulmonary:     Effort: Pulmonary effort is normal.  Abdominal:     General: There is no distension.     Palpations: Abdomen is soft.     Tenderness: There is no abdominal tenderness. There is no rebound.  Musculoskeletal:        General: No swelling. Normal range of motion.  Skin:    General: Skin is warm and dry.     Findings: No rash.  Neurological:     Mental Status: She is alert and oriented to person, place, and time.  Psychiatric:        Mood and Affect: Mood normal.        Behavior: Behavior normal.     GU / Detailed Urogynecologic Evaluation:  Pelvic Exam: Normal external female genitalia; Bartholin's and Skene's glands normal in appearance; urethral meatus normal in appearance, no urethral masses or discharge.   CST: negative  Speculum exam reveals normal vaginal mucosa without atrophy. Cervix normal appearance. Uterus normal single, nontender. Adnexa no mass, fullness, tenderness.     Pelvic floor strength III/V  Pelvic floor musculature: Right levator non-tender, Right obturator non-tender, Left levator non-tender, Left obturator non-tender  POP-Q:   POP-Q  1                                            Aa   1                                           Ba  1.5                                              C   4  Gh  3                                             Pb  8                                            tvl   -0.5                                            Ap  -0.5                                            Bp  -3                                              D     Rectal Exam:  Normal external rectum  Post-Void Residual (PVR) by Bladder Scan: In order to evaluate bladder emptying, we discussed obtaining a postvoid residual and she agreed to this procedure.  Procedure: The ultrasound unit was placed on the patient's abdomen in the suprapubic region after the patient had voided. A PVR of 23 ml was obtained by bladder scan.  Laboratory Results: POC urine: trace leukocytes, + glucose   ASSESSMENT AND PLAN Savannah Rogers is a 55 y.o. with:  1. Uterovaginal prolapse, incomplete   2. Prolapse of anterior vaginal wall   3. Prolapse of posterior vaginal wall   4. SUI (stress urinary incontinence, female)   5. Urinary frequency    Stage II anterior, Stage II posterior, Stage III apical prolapse - For treatment of pelvic organ prolapse, we discussed options for management including expectant management, conservative management, and surgical management, such as Kegels, a pessary, pelvic floor physical therapy, and specific surgical procedures. - We discussed two options for prolapse repair:  1) vaginal repair without mesh - Pros - safer, no mesh complications - Cons - not as strong as mesh repair, higher risk of recurrence  2) laparoscopic repair with mesh - Pros - stronger, better long-term success - Cons - risks of mesh implant (erosion into vagina or bladder, adhering to the rectum, pain) - these risks are lower than with a vaginal mesh but still exist - She is interested in the stronger repair- robotic TLH and sacrocolpopexy. Will proceed with urodynamic testing then schedule.   2. SUI For treatment of stress urinary incontinence,  non-surgical options include expectant management, weight loss, physical therapy,  as well as a pessary.  Surgical options include a midurethral sling, Burch urethropexy, and transurethral injection of a bulking agent. - Incontinence not bothersome to her at this time but would like concomitant surgical repair. She did not demonstrate leakage on exam today so will undergo urodynamic testing.   Return for urodynamics   Jaquita Folds, MD   Medical Decision Making:  - Reviewed/ ordered a clinical laboratory test - Reviewed/ ordered medicine test - Review and summation  of prior records

## 2021-06-28 ENCOUNTER — Other Ambulatory Visit: Payer: Self-pay

## 2021-06-28 ENCOUNTER — Encounter: Payer: Self-pay | Admitting: Obstetrics and Gynecology

## 2021-06-28 ENCOUNTER — Ambulatory Visit (INDEPENDENT_AMBULATORY_CARE_PROVIDER_SITE_OTHER): Payer: BC Managed Care – PPO | Admitting: Obstetrics and Gynecology

## 2021-06-28 VITALS — BP 123/79 | HR 89 | Ht 62.0 in | Wt 139.0 lb

## 2021-06-28 DIAGNOSIS — N812 Incomplete uterovaginal prolapse: Secondary | ICD-10-CM

## 2021-06-28 DIAGNOSIS — N816 Rectocele: Secondary | ICD-10-CM

## 2021-06-28 DIAGNOSIS — N8110 Cystocele, unspecified: Secondary | ICD-10-CM

## 2021-06-28 DIAGNOSIS — R350 Frequency of micturition: Secondary | ICD-10-CM | POA: Diagnosis not present

## 2021-06-28 DIAGNOSIS — N393 Stress incontinence (female) (male): Secondary | ICD-10-CM | POA: Diagnosis not present

## 2021-06-28 LAB — POCT URINALYSIS DIPSTICK
Appearance: NORMAL
Bilirubin, UA: NEGATIVE
Blood, UA: NEGATIVE
Glucose, UA: POSITIVE — AB
Ketones, UA: NEGATIVE
Nitrite, UA: NEGATIVE
Protein, UA: NEGATIVE
Spec Grav, UA: 1.015 (ref 1.010–1.025)
Urobilinogen, UA: 0.2 E.U./dL
pH, UA: 5 (ref 5.0–8.0)

## 2021-06-30 ENCOUNTER — Other Ambulatory Visit: Payer: Self-pay | Admitting: Internal Medicine

## 2021-07-24 NOTE — Progress Notes (Signed)
[facility] Urodynamics Procedure  Referring Physician: Pincus Sanes, MD Date of Procedure: 07/25/2021  Savannah Rogers is a 55 y.o. female who presents for urodynamic evaluation. Indication(s) for study: SUI  Vital Signs: BP 111/77   Pulse 81   Ht 5\' 2"  (1.575 m)   Wt 139 lb (63 kg)   LMP  (LMP Unknown) Comment: LMP 2018  BMI 25.42 kg/m   Laboratory Results: A catheterized urine specimen revealed:  POC urine: negative   Voiding Diary: Not completed  Procedure Timeout:  The correct patient was verified and the correct procedure was verified. The patient was in the correct position and safety precautions were reviewed based on at the patient's history.  Urodynamic Procedure A 36F dual lumen urodynamics catheter was placed under sterile conditions into the patient's bladder. A 36F catheter was placed into the rectum in order to measure abdominal pressure. EMG patches were placed in the appropriate position.  All connections were confirmed and calibrations/adjusted made. Saline was instilled into the bladder through the dual lumen catheters.  Cough/valsalva pressures were measured periodically during filling.  Patient was allowed to void, but she refused to void in the urodynamics chair. Catheters were removed and she voided on her own in the bathroom.   UROFLOW: Revealed a Qmax of 8.5 mL/sec.  She voided 279 mL and had a residual of 75 mL.  It was a interrupted pattern and represented normal habits.  CMG: This was performed with sterile water in the sitting position at a fill rate of 30 mL/min.    First sensation of fullness was 173 mLs,  First urge was 201 mLs,  Strong urge was 230 mLs and  Capacity was 277 mLs  Stress incontinence was not demonstrated Highest negative Barrier CLPP was 100 cmH20 at 277 ml in the standing position. Highest negative Barrier VLPP was 76 cmH20 at 277 ml in the standing position.  Detrusor function was overactive, with phasic  contractions seen.  The first occurred at 182 mL to 5 cm of water and was associated with urge but no leakage  Compliance: decreased. End fill detrusor pressure was 14cmH20.  Calculated compliance was 42mL/cmH20  UPP: MUCP with/ without barrier reduction was 72 cm of water.    MICTURITION STUDY: Patient refused to void in the urodynamics chair.   EMG: This was performed with patches.  She had voluntary contractions, recruitment with fill was present.  The details of the procedure with the study tracings have been scanned into EPIC.   Urodynamic Impression:  1. Sensation was normal; capacity was reduced.  2. Stress Incontinence was not demonstrated; 3. Detrusor Overactivity was demonstrated without leakage. 4. Emptying could not be evaluated as patient refused to void during the test.   Plan: - The patient will follow up  to discuss the findings and treatment options.    18m, MD

## 2021-07-25 ENCOUNTER — Encounter: Payer: Self-pay | Admitting: Obstetrics and Gynecology

## 2021-07-25 ENCOUNTER — Ambulatory Visit (INDEPENDENT_AMBULATORY_CARE_PROVIDER_SITE_OTHER): Payer: BC Managed Care – PPO | Admitting: Obstetrics and Gynecology

## 2021-07-25 ENCOUNTER — Other Ambulatory Visit: Payer: Self-pay

## 2021-07-25 VITALS — BP 111/77 | HR 81 | Ht 62.0 in | Wt 139.0 lb

## 2021-07-25 DIAGNOSIS — R350 Frequency of micturition: Secondary | ICD-10-CM | POA: Diagnosis not present

## 2021-07-25 DIAGNOSIS — R3915 Urgency of urination: Secondary | ICD-10-CM

## 2021-07-25 LAB — POCT URINALYSIS DIPSTICK
Appearance: NORMAL
Bilirubin, UA: NEGATIVE
Blood, UA: NEGATIVE
Glucose, UA: POSITIVE — AB
Ketones, UA: NEGATIVE
Leukocytes, UA: NEGATIVE
Nitrite, UA: NEGATIVE
Protein, UA: NEGATIVE
Spec Grav, UA: 1.015 (ref 1.010–1.025)
Urobilinogen, UA: 0.2 E.U./dL
pH, UA: 5 (ref 5.0–8.0)

## 2021-07-25 NOTE — Patient Instructions (Signed)

## 2021-08-07 ENCOUNTER — Encounter (HOSPITAL_BASED_OUTPATIENT_CLINIC_OR_DEPARTMENT_OTHER): Payer: Self-pay | Admitting: Obstetrics and Gynecology

## 2021-08-07 ENCOUNTER — Other Ambulatory Visit: Payer: Self-pay

## 2021-08-07 DIAGNOSIS — Z20822 Contact with and (suspected) exposure to covid-19: Secondary | ICD-10-CM | POA: Diagnosis not present

## 2021-08-07 DIAGNOSIS — Z01818 Encounter for other preprocedural examination: Secondary | ICD-10-CM | POA: Diagnosis present

## 2021-08-07 DIAGNOSIS — K429 Umbilical hernia without obstruction or gangrene: Secondary | ICD-10-CM

## 2021-08-07 HISTORY — DX: Umbilical hernia without obstruction or gangrene: K42.9

## 2021-08-07 NOTE — Progress Notes (Signed)
Spoke w/ via phone for pre-op interview---pt Lab needs dos----  none             Lab results------has lab appt 08-09-2021 at 1100 am for cbc bmp  t &s, ekg COVID test ----08-09-2021 extended stay Arrive at ------- NPO after MN NO Solid Food.  Clear liquids from MN until---430 am Med rec completed Medications to take morning of surgery -----allopurinol, atorvastatin Diabetic medication -----none day of surgery Patient instructed no nail polish to be worn day of surgery Patient instructed to bring photo id and insurance card day of surgery Patient aware to have Driver (ride ) / caregiver sister in law   for 24 hours after surgery  Patient Special Instructions -----pt given extended stay instructions Pre-Op special Istructions -----none Patient verbalized understanding of instructions that were given at this phone interview. Patient denies shortness of breath, chest pain, fever, cough at this phone interview.

## 2021-08-07 NOTE — Progress Notes (Signed)
YOU ARE SCHEDULED FOR A COVID TEST ON   08-09-2021  . THIS TEST MUST BE DONE BEFORE SURGERY. GO TO  [facility] @ [address]   phone number [phone] AND REMAIN IN YOUR CAR, THIS IS A DRIVE UP TEST.  AFTER YOUR COVID TEST , PLEASE WEAR A MASK OUT IN PUBLIC AND SOCIAL DISTANCE AND WASH YOUR HANDS FREQUENTLY. PLEASE ASK ALL YOUR CLOSE HOUSEHOLD CONTACT TO WEAR MASK OUT IN PUBLIC AND SOCIAL DISTANCE AND WASH HANDS FREQUENTLY ALSO.      Your procedure is scheduled on 08-13-2021  Report to [facility] AT  530 A. M.   Call this number if you have problems the morning of surgery  [phone].   OUR ADDRESS IS [address].  WE ARE LOCATED IN [facility].  PLEASE BRING YOUR INSURANCE CARD AND PHOTO ID DAY OF SURGERY.  ONLY ONE PERSON ALLOWED IN FACILITY WAITING AREA.                                     REMEMBER:  DO NOT EAT FOOD, CANDY GUM OR MINTS  AFTER MIDNIGHT . YOU MAY HAVE CLEAR LIQUIDS FROM MIDNIGHT UNTIL 430 AM. NO CLEAR LIQUIDS AFTER 430 AM DAY OF SURGERY.   YOU MAY  BRUSH YOUR TEETH MORNING OF SURGERY AND RINSE YOUR MOUTH OUT, NO CHEWING GUM CANDY OR MINTS.    CLEAR LIQUID DIET   Foods Allowed                                                                     Foods Excluded  Coffee and tea, regular and decaf                             liquids that you cannot  Plain Jell-O any favor except red or purple                                           see through such as: Fruit ices (not with fruit pulp)                                     milk, soups, orange juice  Iced Popsicles                                    All solid food Carbonated beverages, regular and diet                                    Cranberry, grape and apple juices Sports drinks like Gatorade Lightly seasoned clear broth or consume(fat free) Sugar  Sample Menu Breakfast  Lunch                                      Supper Cranberry juice                    Beef broth                            Chicken broth Jell-O                                     Grape juice                           Apple juice Coffee or tea                        Jell-O                                      Popsicle                                                Coffee or tea                        Coffee or tea  _____________________________________________________________________     TAKE THESE MEDICATIONS MORNING OF SURGERY WITH A SIP OF WATER:  ALLOPURINOL, ATORVASTATIN.  DO NOT TAKE YOUR BLOOD PRESSURE MEDICATION  OR DIABETIC MEDICATIONS DAY OF SURGERY.  ONE VISITOR IS ALLOWED IN WAITING ROOM ONLY DAY OF SURGERY.  NO VISITOR MAY SPEND THE NIGHT.  VISITOR ARE ALLOWED TO STAY UNTIL 800 PM.                                    DO NOT WEAR JEWERLY, MAKE UP. DO NOT WEAR LOTIONS, POWDERS, PERFUMES OR NAIL POLISH. DO NOT SHAVE FOR 48 HOURS PRIOR TO DAY OF SURGERY. MEN MAY SHAVE FACE AND NECK. CONTACTS, GLASSES, OR DENTURES MAY NOT BE WORN TO SURGERY.                                    [facility] IS NOT RESPONSIBLE  FOR ANY BELONGINGS.                                                                    Marland Kitchen            - Preparing for Surgery Before surgery, you can play an important role.  Because skin is not sterile, your skin needs to be as free of germs as possible.  You can reduce the number of germs  on your skin by washing with CHG (chlorahexidine gluconate) soap before surgery.  CHG is an antiseptic cleaner which kills germs and bonds with the skin to continue killing germs even after washing. Please DO NOT use if you have an allergy to CHG or antibacterial soaps.  If your skin becomes reddened/irritated stop using the CHG and inform your nurse when you arrive at Short Stay. Do not shave (including legs and underarms) for at least 48 hours prior to the first CHG shower.  You may shave your face/neck. Please  follow these instructions carefully:  1.  Shower with CHG Soap the night before surgery and the  morning of Surgery.  2.  If you choose to wash your hair, wash your hair first as usual with your  normal  shampoo.  3.  After you shampoo, rinse your hair and body thoroughly to remove the  shampoo.                            4.  Use CHG as you would any other liquid soap.  You can apply chg directly  to the skin and wash                      Gently with a scrungie or clean washcloth.  5.  Apply the CHG Soap to your body ONLY FROM THE NECK DOWN.   Do not use on face/ open                           Wound or open sores. Avoid contact with eyes, ears mouth and genitals (private parts).                       Wash face,  Genitals (private parts) with your normal soap.             6.  Wash thoroughly, paying special attention to the area where your surgery  will be performed.  7.  Thoroughly rinse your body with warm water from the neck down.  8.  DO NOT shower/wash with your normal soap after using and rinsing off  the CHG Soap.                9.  Pat yourself dry with a clean towel.            10.  Wear clean pajamas.            11.  Place clean sheets on your bed the night of your first shower and do not  sleep with pets. Day of Surgery : Do not apply any lotions/deodorants the morning of surgery.  Please wear clean clothes to the hospital/surgery center.  FAILURE TO FOLLOW THESE INSTRUCTIONS MAY RESULT IN THE CANCELLATION OF YOUR SURGERY PATIENT SIGNATURE_________________________________  NURSE SIGNATURE__________________________________  ________________________________________________________________________                                                        QUESTIONS Savannah Rogers PRE OP NURSE PHONE [phone].

## 2021-08-08 NOTE — Progress Notes (Signed)
[facility] Pre-Operative visit  Subjective Chief Complaint: Savannah Rogers presents for a preoperative encounter.   History of Present Illness: Savannah Rogers is a 55 y.o. female who presents for follow up after urodynamic testing and to further discuss plan for surgery. preoperative visit. She has no changes to her symptoms.   Urodynamic Impression:  1. Sensation was normal; capacity was reduced.  2. Stress Incontinence was not demonstrated; 3. Detrusor Overactivity was demonstrated without leakage. 4. Emptying could not be evaluated as patient refused to void during the test.   Past Medical History:  Diagnosis Date   COVID 10/2020   cough fever x 10 days all symptoms resolved   Hyperlipidemia    Hypertension    type 2 dm    Umbilical hernia 48/12/6551   small no problems with per pt     Past Surgical History:  Procedure Laterality Date   BONE MARROW BIOPSY  2006   for elevated WBC   CHOLECYSTECTOMY  03/16/2012   INTRAOPERATIVE CHOLANGIOGRAM;  Surgeon: Earnstine Regal, MD;  Location: [facility];  Service: General;  Laterality: N/A;   G3  P1  A2     laparascopic cholecystectomy  03/2012   Dr Harlow Asa    has No Known Allergies.   Family History  Problem Relation Age of Onset   Stroke Paternal Grandmother 64   Stroke Paternal Grandfather 71   Hypertension Father    Hypertension Mother    Breast cancer Maternal Aunt    Diabetes Neg Hx    Heart disease Neg Hx     Social History   Tobacco Use   Smoking status: Never   Smokeless tobacco: Never  Vaping Use   Vaping Use: Never used  Substance Use Topics   Alcohol use: No    Alcohol/week: 0.0 standard drinks   Drug use: No     Review of Systems was negative for a full 10 system review except as noted in the History of Present Illness.   Current Outpatient Medications:    allopurinol (ZYLOPRIM) 100 MG tablet, Take 1 tablet (100 mg total) by mouth daily., Disp: 90 tablet, Rfl: 1   atorvastatin (LIPITOR)  20 MG tablet, TAKE 1 TABLET BY MOUTH EVERY DAY, Disp: 90 tablet, Rfl: 1   dapagliflozin propanediol (FARXIGA) 10 MG TABS tablet, Take 1 tablet (10 mg total) by mouth daily before breakfast., Disp: 90 tablet, Rfl: 1   Glucose Blood (BLOOD GLUCOSE TEST STRIPS) STRP, One Touch. Test sugars once daily as directed. E11.9, Disp: 90 strip, Rfl: 3   icosapent Ethyl (VASCEPA) 1 g capsule, Take 2 capsules (2 g total) by mouth 2 (two) times daily., Disp: 120 capsule, Rfl: 5   metFORMIN (GLUCOPHAGE-XR) 500 MG 24 hr tablet, TAKE 2 TABLETS (1,000 MG TOTAL) BY MOUTH 2 (TWO) TIMES DAILY BEFORE A MEAL., Disp: 360 tablet, Rfl: 1   telmisartan (MICARDIS) 40 MG tablet, TAKE 1 TABLET BY MOUTH EVERY DAY, Disp: 90 tablet, Rfl: 1   Objective There were no vitals filed for this visit.  General: AAO x3 Pulmonary:     Effort: Pulmonary effort is normal.  Abdominal:     General: There is no distension.     Palpations: Abdomen is soft.     Tenderness: There is no abdominal tenderness. There is no rebound.   Previous Pelvic Exam showed: POP-Q:    POP-Q   1  Aa   1                                           Ba   1.5                                              C    4                                            Gh   3                                            Pb   8                                            tvl    -0.5                                            Ap   -0.5                                            Bp   -3                                              D        Assessment/ Plan  Assessment: The patient is a 55 y.o. year old with stage II pelvic organ prolapse  Plan: Robotic assisted- total laparoscopic hysterectomy, bilateral salpingectomy, sacrocolpopexy, cystoscopy  General Surgical Consent: The patient has previously been counseled on alternative treatments, and the decision by the patient and provider was to proceed with the procedure  listed above.  For all procedures, there are risks of bleeding, infection, damage to surrounding organs including but not limited to bowel, bladder, blood vessels, ureters and nerves, and need for further surgery if an injury were to occur. These risks are all low with minimally invasive surgery.   There are risks of numbness and weakness at any body site or buttock/rectal pain.  It is possible that baseline pain can be worsened by surgery, either with or without mesh. If surgery is vaginal, there is also a low risk of possible conversion to laparoscopy or open abdominal incision where indicated. Very rare risks include blood transfusion, blood clot, heart attack, pneumonia, or death.   There is also a risk of short-term postoperative urinary retention with need to use a catheter. About half of patients need to go home from surgery with a catheter, which is then later removed in the office.  The risk of long-term need for a catheter is very low. There is also a risk of worsening of overactive bladder.    Prolapse (with or without mesh): Risk factors for surgical failure  include things that put pressure on your pelvis and the surgical repair, including obesity, chronic cough, and heavy lifting or straining (including lifting children or adults, straining on the toilet, or lifting heavy objects such as furniture or anything weighing >25 lbs. Risks of recurrence is 20-30% with vaginal native tissue repair and a less than 10% with sacrocolpopexy with mesh.    Sacrocolpopexy: Mesh implants may provide more prolapse support, but do have some unique risks to consider. It is important to understand that mesh is permanent and cannot be easily removed. Risks of abdominal sacrocolpopexy mesh include mesh exposure (~3-6%), painful intercourse (recent studies show lower rates after surgery compared to before, with ~5-8% risk of new onset), and very rare risks of bowel or bladder injury or infection (<1%). The risk of  mesh exposure is more likely in a woman with risks for poor healing (prior radiation, poorly controlled diabetes, or immunocompromised). The risk of new or worsened chronic pain after mesh implant is more common in women with baseline chronic pain and/or poorly controlled anxiety or depression. There is an FDA safety notification on vaginal mesh procedures for prolapse but NOT abdominal mesh procedures and therefore does not apply to your surgery. We have extensive experience and training with mesh placement and we have close postoperative follow up to identify any potential complications from mesh.    We discussed consent for blood products. Risks for blood transfusion include allergic reactions, other reactions that can affect different body organs and managed accordingly, transmission of infectious diseases such as HIV or Hepatitis. However, the blood is screened. Patient consents for blood products.  Pre-operative instructions:  She was instructed to not take Aspirin/NSAIDs x 7days prior to surgery.  Antibiotic prophylaxis was ordered as indicated.  Cathter use: Patient will go home with foley if needed after post-operative voiding trial.  Post-operative instructions:  She was provided with specific post-operative instructions, including precautions and signs/symptoms for which we would recommend contacting us, in addition to daytime and after-hours contact phone numbers. This was provided on a handout.   Post-operative medications: Prescriptions for motrin, tylenol, miralax, and oxycodone were sent to her pharmacy. Discussed using ibuprofen and tylenol on a schedule to limit use of narcotics.   Laboratory testing:  We will check labs: CBC, type and screen. Preoperative clearance:  She does not require surgical clearance.    Post-operative follow-up:  A post-operative appointment will be made for 6 weeks from the date of surgery. If she needs a post-operative nurse visit for a voiding trial, that  will be set up after she leaves the hospital.    Patient will call the clinic or use MyChart should anything change or any new issues arise.   Jaquita Folds, MD

## 2021-08-09 ENCOUNTER — Other Ambulatory Visit: Payer: Self-pay | Admitting: Obstetrics and Gynecology

## 2021-08-09 ENCOUNTER — Other Ambulatory Visit: Payer: Self-pay

## 2021-08-09 ENCOUNTER — Ambulatory Visit (INDEPENDENT_AMBULATORY_CARE_PROVIDER_SITE_OTHER): Payer: BC Managed Care – PPO | Admitting: Obstetrics and Gynecology

## 2021-08-09 ENCOUNTER — Encounter (HOSPITAL_COMMUNITY)
Admission: RE | Admit: 2021-08-09 | Discharge: 2021-08-09 | Disposition: A | Discharge status: Home / Self Care | Payer: BC Managed Care – PPO | Source: Ambulatory Visit | Attending: Obstetrics and Gynecology | Admitting: Obstetrics and Gynecology

## 2021-08-09 ENCOUNTER — Encounter: Payer: Self-pay | Admitting: Obstetrics and Gynecology

## 2021-08-09 VITALS — BP 123/77 | HR 86 | Wt 138.0 lb

## 2021-08-09 DIAGNOSIS — Z01818 Encounter for other preprocedural examination: Secondary | ICD-10-CM | POA: Diagnosis not present

## 2021-08-09 DIAGNOSIS — Z20822 Contact with and (suspected) exposure to covid-19: Secondary | ICD-10-CM | POA: Insufficient documentation

## 2021-08-09 LAB — CBC
HCT: 42 % (ref 36.0–46.0)
Hemoglobin: 14.1 g/dL (ref 12.0–15.0)
MCH: 29.3 pg (ref 26.0–34.0)
MCHC: 33.6 g/dL (ref 30.0–36.0)
MCV: 87.1 fL (ref 80.0–100.0)
Platelets: 365 10*3/uL (ref 150–400)
RBC: 4.82 MIL/uL (ref 3.87–5.11)
RDW: 13.2 % (ref 11.5–15.5)
WBC: 12.1 10*3/uL — ABNORMAL HIGH (ref 4.0–10.5)
nRBC: 0 % (ref 0.0–0.2)

## 2021-08-09 LAB — BASIC METABOLIC PANEL
Anion gap: 11 (ref 5–15)
BUN: 22 mg/dL — ABNORMAL HIGH (ref 6–20)
CO2: 25 mmol/L (ref 22–32)
Calcium: 10.1 mg/dL (ref 8.9–10.3)
Chloride: 103 mmol/L (ref 98–111)
Creatinine, Ser: 0.74 mg/dL (ref 0.44–1.00)
GFR, Estimated: >60 mL/min (ref >60)
Glucose, Bld: 150 mg/dL — ABNORMAL HIGH (ref 70–99)
Potassium: 4.3 mmol/L (ref 3.5–5.1)
Sodium: 139 mmol/L (ref 135–145)

## 2021-08-09 MED ORDER — POLYETHYLENE GLYCOL 3350 17 GM/SCOOP PO POWD: 17.0000 g | Freq: Every day | ORAL | 0 refills | Status: AC

## 2021-08-09 MED ORDER — OXYCODONE HCL 5 MG PO TABS: 5.0000 mg | ORAL_TABLET | ORAL | 0 refills | Status: DC | PRN

## 2021-08-09 MED ORDER — ACETAMINOPHEN 500 MG PO TABS: 500.0000 mg | ORAL_TABLET | Freq: Four times a day (QID) | ORAL | 0 refills | Status: AC | PRN

## 2021-08-09 MED ORDER — IBUPROFEN 600 MG PO TABS: 600.0000 mg | ORAL_TABLET | Freq: Four times a day (QID) | ORAL | 0 refills | Status: DC | PRN

## 2021-08-09 NOTE — H&P (Signed)
[facility] Pre-Operative H&P  Subjective Chief Complaint: Savannah Rogers presents for a preoperative encounter.   History of Present Illness: Savannah Rogers is a 55 y.o. female who presents for preoperative visit.  She is scheduled to undergo Robotic assisted- total laparoscopic hysterectomy, bilateral salpingectomy, sacrocolpopexy, cystoscopy on 08/13/21.  Her symptoms include vaginal bulge, and she was was found to have Stage II anterior, Stage I posterior, Stage I apical prolapse .   Urodynamics showed: 1. Sensation was normal; capacity was reduced.  2. Stress Incontinence was not demonstrated; 3. Detrusor Overactivity was demonstrated without leakage. 4. Emptying could not be evaluated as patient refused to void during the test.   Past Medical History:  Diagnosis Date   COVID 10/2020   cough fever x 10 days all symptoms resolved   Hyperlipidemia    Hypertension    type 2 dm    Umbilical hernia 10/62/6948   small no problems with per pt     Past Surgical History:  Procedure Laterality Date   BONE MARROW BIOPSY  2006   for elevated WBC   CHOLECYSTECTOMY  03/16/2012   INTRAOPERATIVE CHOLANGIOGRAM;  Surgeon: Earnstine Regal, MD;  Location: [facility];  Service: General;  Laterality: N/A;   G3  P1  A2     laparascopic cholecystectomy  03/2012   Dr Harlow Asa    has No Known Allergies.   Family History  Problem Relation Age of Onset   Stroke Paternal Grandmother 50   Stroke Paternal Grandfather 84   Hypertension Father    Hypertension Mother    Breast cancer Maternal Aunt    Diabetes Neg Hx    Heart disease Neg Hx     Social History   Tobacco Use   Smoking status: Never   Smokeless tobacco: Never  Vaping Use   Vaping Use: Never used  Substance Use Topics   Alcohol use: No    Alcohol/week: 0.0 standard drinks   Drug use: No     Review of Systems was negative for a full 10 system review except as noted in the History of Present Illness.  No current  facility-administered medications for this encounter.  Current Outpatient Medications:    acetaminophen (TYLENOL) 500 MG tablet, Take 1 tablet (500 mg total) by mouth every 6 (six) hours as needed (pain)., Disp: 30 tablet, Rfl: 0   allopurinol (ZYLOPRIM) 100 MG tablet, Take 1 tablet (100 mg total) by mouth daily., Disp: 90 tablet, Rfl: 1   atorvastatin (LIPITOR) 20 MG tablet, TAKE 1 TABLET BY MOUTH EVERY DAY, Disp: 90 tablet, Rfl: 1   dapagliflozin propanediol (FARXIGA) 10 MG TABS tablet, Take 1 tablet (10 mg total) by mouth daily before breakfast., Disp: 90 tablet, Rfl: 1   Glucose Blood (BLOOD GLUCOSE TEST STRIPS) STRP, One Touch. Test sugars once daily as directed. E11.9, Disp: 90 strip, Rfl: 3   ibuprofen (ADVIL) 600 MG tablet, Take 1 tablet (600 mg total) by mouth every 6 (six) hours as needed., Disp: 30 tablet, Rfl: 0   icosapent Ethyl (VASCEPA) 1 g capsule, Take 2 capsules (2 g total) by mouth 2 (two) times daily., Disp: 120 capsule, Rfl: 5   metFORMIN (GLUCOPHAGE-XR) 500 MG 24 hr tablet, TAKE 2 TABLETS (1,000 MG TOTAL) BY MOUTH 2 (TWO) TIMES DAILY BEFORE A MEAL., Disp: 360 tablet, Rfl: 1   oxyCODONE (OXY IR/ROXICODONE) 5 MG immediate release tablet, Take 1 tablet (5 mg total) by mouth every 4 (four) hours as needed for severe pain., Disp: 15  tablet, Rfl: 0   polyethylene glycol powder (GLYCOLAX/MIRALAX) 17 GM/SCOOP powder, Take 17 g by mouth daily. Drink 17g (1 scoop) dissolved in water per day., Disp: 255 g, Rfl: 0   telmisartan (MICARDIS) 40 MG tablet, TAKE 1 TABLET BY MOUTH EVERY DAY, Disp: 90 tablet, Rfl: 1   Objective   General: AAO x3 Pulmonary:     Effort: Pulmonary effort is normal.  Abdominal:     General: There is no distension.     Palpations: Abdomen is soft.     Tenderness: There is no abdominal tenderness. There is no rebound.    Previous Pelvic Exam showed: POP-Q:    POP-Q   1                                            Aa   1                                            Ba   1.5                                              C    4                                            Gh   3                                            Pb   8                                            tvl    -0.5                                            Ap   -0.5                                            Bp   -3                                              D         Assessment/ Plan  Assessment: The patient is a 55 y.o. year old with stage II POP  Plan: Robotic assisted- total laparoscopic hysterectomy, bilateral salpingectomy, sacrocolpopexy, cystoscopy  Jaquita Folds, MD

## 2021-08-10 LAB — SARS CORONAVIRUS 2 (TAT 6-24 HRS): SARS Coronavirus 2: NEGATIVE

## 2021-08-12 NOTE — Anesthesia Preprocedure Evaluation (Addendum)
Anesthesia Evaluation  Patient identified by MRN, date of birth, ID band Patient awake    Reviewed: Allergy & Precautions, NPO status , Patient's Chart, lab work & pertinent test results  Airway Mallampati: III  TM Distance: >3 FB Neck ROM: Full    Dental no notable dental hx. (+) Dental Advisory Given, Teeth Intact   Pulmonary neg pulmonary ROS,    Pulmonary exam normal breath sounds clear to auscultation       Cardiovascular hypertension, Pt. on medications Normal cardiovascular exam Rhythm:Regular Rate:Normal     Neuro/Psych negative neurological ROS  negative psych ROS   GI/Hepatic Neg liver ROS, GERD  Controlled,  Endo/Other  diabetes, Well Controlled, Type 2, Oral Hypoglycemic Agentsa1c 7.3  Renal/GU negative Renal ROS Bladder dysfunction Female GU complaint (uterovaginal prolapse)     Musculoskeletal negative musculoskeletal ROS (+)   Abdominal   Peds  Hematology negative hematology ROS (+) hct 42   Anesthesia Other Findings   Reproductive/Obstetrics negative OB ROS                            Anesthesia Physical Anesthesia Plan  ASA: 2  Anesthesia Plan: General   Post-op Pain Management:    Induction: Intravenous  PONV Risk Score and Plan: 4 or greater and Ondansetron, Dexamethasone, Midazolam, Scopolamine patch - Pre-op and Treatment may vary due to age or medical condition  Airway Management Planned: Oral ETT  Additional Equipment: None  Intra-op Plan:   Post-operative Plan: Extubation in OR  Informed Consent: I have reviewed the patients History and Physical, chart, labs and discussed the procedure including the risks, benefits and alternatives for the proposed anesthesia with the patient or authorized representative who has indicated his/her understanding and acceptance.     Dental advisory given  Plan Discussed with: CRNA  Anesthesia Plan Comments:         Anesthesia Quick Evaluation

## 2021-08-13 ENCOUNTER — Other Ambulatory Visit: Payer: Self-pay

## 2021-08-13 ENCOUNTER — Ambulatory Visit (HOSPITAL_BASED_OUTPATIENT_CLINIC_OR_DEPARTMENT_OTHER): Payer: BC Managed Care – PPO | Admitting: Anesthesiology

## 2021-08-13 ENCOUNTER — Encounter (HOSPITAL_BASED_OUTPATIENT_CLINIC_OR_DEPARTMENT_OTHER)
Admission: RE | Disposition: A | Discharge status: Home / Self Care | Payer: Self-pay | Source: Ambulatory Visit | Attending: Obstetrics and Gynecology

## 2021-08-13 ENCOUNTER — Encounter (HOSPITAL_BASED_OUTPATIENT_CLINIC_OR_DEPARTMENT_OTHER): Payer: Self-pay | Admitting: Obstetrics and Gynecology

## 2021-08-13 ENCOUNTER — Ambulatory Visit (HOSPITAL_BASED_OUTPATIENT_CLINIC_OR_DEPARTMENT_OTHER)
Admission: RE | Admit: 2021-08-13 | Discharge: 2021-08-13 | Disposition: A | Discharge status: Home / Self Care | Payer: BC Managed Care – PPO | Source: Ambulatory Visit | Attending: Obstetrics and Gynecology | Admitting: Obstetrics and Gynecology

## 2021-08-13 DIAGNOSIS — N812 Incomplete uterovaginal prolapse: Secondary | ICD-10-CM | POA: Diagnosis not present

## 2021-08-13 DIAGNOSIS — N393 Stress incontinence (female) (male): Secondary | ICD-10-CM

## 2021-08-13 DIAGNOSIS — E119 Type 2 diabetes mellitus without complications: Secondary | ICD-10-CM | POA: Diagnosis not present

## 2021-08-13 DIAGNOSIS — N816 Rectocele: Secondary | ICD-10-CM

## 2021-08-13 DIAGNOSIS — Z8616 Personal history of COVID-19: Secondary | ICD-10-CM | POA: Insufficient documentation

## 2021-08-13 DIAGNOSIS — N72 Inflammatory disease of cervix uteri: Secondary | ICD-10-CM | POA: Diagnosis not present

## 2021-08-13 DIAGNOSIS — E1165 Type 2 diabetes mellitus with hyperglycemia: Secondary | ICD-10-CM

## 2021-08-13 DIAGNOSIS — I10 Essential (primary) hypertension: Secondary | ICD-10-CM | POA: Diagnosis not present

## 2021-08-13 DIAGNOSIS — Z7984 Long term (current) use of oral hypoglycemic drugs: Secondary | ICD-10-CM | POA: Diagnosis not present

## 2021-08-13 DIAGNOSIS — Z79899 Other long term (current) drug therapy: Secondary | ICD-10-CM | POA: Diagnosis not present

## 2021-08-13 DIAGNOSIS — N879 Dysplasia of cervix uteri, unspecified: Secondary | ICD-10-CM | POA: Diagnosis not present

## 2021-08-13 DIAGNOSIS — N800 Endometriosis of uterus: Secondary | ICD-10-CM | POA: Insufficient documentation

## 2021-08-13 HISTORY — PX: CYSTOSCOPY: SHX5120

## 2021-08-13 HISTORY — PX: XI ROBOTIC ASSISTED TOTAL HYSTERECTOMY WITH SACROCOLPOPEXY: SHX6825

## 2021-08-13 LAB — GLUCOSE, CAPILLARY
Glucose-Capillary: 149 mg/dL — ABNORMAL HIGH (ref 70–99)
Glucose-Capillary: 199 mg/dL — ABNORMAL HIGH (ref 70–99)

## 2021-08-13 LAB — TYPE AND SCREEN
ABO/RH(D): O POS
Antibody Screen: NEGATIVE

## 2021-08-13 LAB — ABO/RH: ABO/RH(D): O POS

## 2021-08-13 SURGERY — HYSTERECTOMY, TOTAL, ROBOT-ASSISTED, WITH SACROCOLPOPEXY
Anesthesia: General

## 2021-08-13 MED ORDER — ROCURONIUM BROMIDE 10 MG/ML (PF) SYRINGE
PREFILLED_SYRINGE | INTRAVENOUS | Status: DC | PRN
Start: 2021-08-13 — End: 2021-08-13
  Administered 2021-08-13: 100 mg via INTRAVENOUS
  Administered 2021-08-13: 10 mg via INTRAVENOUS

## 2021-08-13 MED ORDER — OXYCODONE HCL 5 MG PO TABS
5.0000 mg | ORAL_TABLET | Freq: Once | ORAL | Status: AC | PRN
  Administered 2021-08-13: 5 mg via ORAL

## 2021-08-13 MED ORDER — ONDANSETRON HCL 4 MG/2ML IJ SOLN: 4.0000 mg | Freq: Four times a day (QID) | INTRAMUSCULAR | Status: DC | PRN

## 2021-08-13 MED ORDER — MEPERIDINE HCL 25 MG/ML IJ SOLN: 6.2500 mg | INTRAMUSCULAR | Status: DC | PRN

## 2021-08-13 MED ORDER — LIDOCAINE 2% (20 MG/ML) 5 ML SYRINGE
INTRAMUSCULAR | Status: DC | PRN
  Administered 2021-08-13: 60 mg via INTRAVENOUS

## 2021-08-13 MED ORDER — ACETAMINOPHEN 500 MG PO TABS
ORAL_TABLET | ORAL | Status: AC
  Filled 2021-08-13: qty 2

## 2021-08-13 MED ORDER — CELECOXIB 200 MG PO CAPS
400.0000 mg | ORAL_CAPSULE | ORAL | Status: AC
  Administered 2021-08-13: 400 mg via ORAL

## 2021-08-13 MED ORDER — DEXAMETHASONE SODIUM PHOSPHATE 10 MG/ML IJ SOLN
INTRAMUSCULAR | Status: DC | PRN
  Administered 2021-08-13: 5 mg via INTRAVENOUS

## 2021-08-13 MED ORDER — EPHEDRINE SULFATE-NACL 50-0.9 MG/10ML-% IV SOSY
PREFILLED_SYRINGE | INTRAVENOUS | Status: DC | PRN
  Administered 2021-08-13: 15 mg via INTRAVENOUS
  Administered 2021-08-13: 10 mg via INTRAVENOUS

## 2021-08-13 MED ORDER — CEFAZOLIN SODIUM-DEXTROSE 2-4 GM/100ML-% IV SOLN
2.0000 g | INTRAVENOUS | Status: AC
  Administered 2021-08-13: 2 g via INTRAVENOUS

## 2021-08-13 MED ORDER — ACETAMINOPHEN 500 MG PO TABS: 1000.0000 mg | ORAL_TABLET | Freq: Once | ORAL | Status: DC

## 2021-08-13 MED ORDER — MIDAZOLAM HCL 2 MG/2ML IJ SOLN
INTRAMUSCULAR | Status: DC | PRN
  Administered 2021-08-13 (×2): 1 mg via INTRAVENOUS

## 2021-08-13 MED ORDER — PROMETHAZINE HCL 25 MG/ML IJ SOLN: 6.2500 mg | INTRAMUSCULAR | Status: DC | PRN

## 2021-08-13 MED ORDER — KETOROLAC TROMETHAMINE 30 MG/ML IJ SOLN
INTRAMUSCULAR | Status: AC
  Filled 2021-08-13: qty 1

## 2021-08-13 MED ORDER — ONDANSETRON HCL 4 MG PO TABS: 4.0000 mg | ORAL_TABLET | Freq: Four times a day (QID) | ORAL | Status: DC | PRN

## 2021-08-13 MED ORDER — SODIUM CHLORIDE 0.9 % IR SOLN
Status: DC | PRN
  Administered 2021-08-13: 1000 mL

## 2021-08-13 MED ORDER — PROPOFOL 10 MG/ML IV BOLUS
INTRAVENOUS | Status: DC | PRN
  Administered 2021-08-13: 100 mg via INTRAVENOUS

## 2021-08-13 MED ORDER — IBUPROFEN 200 MG PO TABS: 600.0000 mg | ORAL_TABLET | Freq: Four times a day (QID) | ORAL | Status: DC

## 2021-08-13 MED ORDER — POVIDONE-IODINE 10 % EX SWAB
2.0000 "application " | Freq: Once | CUTANEOUS | Status: AC
  Administered 2021-08-13: 2 via TOPICAL

## 2021-08-13 MED ORDER — ACETAMINOPHEN 325 MG PO TABS
ORAL_TABLET | ORAL | Status: AC
  Filled 2021-08-13: qty 2

## 2021-08-13 MED ORDER — ACETAMINOPHEN 500 MG PO TABS
1000.0000 mg | ORAL_TABLET | ORAL | Status: AC
  Administered 2021-08-13: 1000 mg via ORAL

## 2021-08-13 MED ORDER — ROCURONIUM BROMIDE 10 MG/ML (PF) SYRINGE
PREFILLED_SYRINGE | INTRAVENOUS | Status: AC
  Filled 2021-08-13: qty 10

## 2021-08-13 MED ORDER — PHENAZOPYRIDINE HCL 100 MG PO TABS
ORAL_TABLET | ORAL | Status: AC
  Filled 2021-08-13: qty 2

## 2021-08-13 MED ORDER — FENTANYL CITRATE (PF) 100 MCG/2ML IJ SOLN
INTRAMUSCULAR | Status: DC | PRN
  Administered 2021-08-13 (×3): 50 ug via INTRAVENOUS

## 2021-08-13 MED ORDER — GABAPENTIN 300 MG PO CAPS
ORAL_CAPSULE | ORAL | Status: AC
  Filled 2021-08-13: qty 1

## 2021-08-13 MED ORDER — HYDROMORPHONE HCL 1 MG/ML IJ SOLN
INTRAMUSCULAR | Status: AC
  Filled 2021-08-13: qty 1

## 2021-08-13 MED ORDER — SCOPOLAMINE 1 MG/3DAYS TD PT72
MEDICATED_PATCH | TRANSDERMAL | Status: AC
  Filled 2021-08-13: qty 1

## 2021-08-13 MED ORDER — KETOROLAC TROMETHAMINE 30 MG/ML IJ SOLN: 30.0000 mg | Freq: Once | INTRAMUSCULAR | Status: DC | PRN

## 2021-08-13 MED ORDER — LIDOCAINE 2% (20 MG/ML) 5 ML SYRINGE
INTRAMUSCULAR | Status: AC
  Filled 2021-08-13: qty 5

## 2021-08-13 MED ORDER — SUGAMMADEX SODIUM 200 MG/2ML IV SOLN
INTRAVENOUS | Status: DC | PRN
  Administered 2021-08-13: 200 mg via INTRAVENOUS

## 2021-08-13 MED ORDER — ACETAMINOPHEN 325 MG PO TABS
650.0000 mg | ORAL_TABLET | ORAL | Status: DC | PRN
  Administered 2021-08-13: 650 mg via ORAL

## 2021-08-13 MED ORDER — SCOPOLAMINE 1 MG/3DAYS TD PT72
1.0000 | MEDICATED_PATCH | TRANSDERMAL | Status: DC
  Administered 2021-08-13: 1.5 mg via TRANSDERMAL

## 2021-08-13 MED ORDER — ONDANSETRON HCL 4 MG/2ML IJ SOLN
INTRAMUSCULAR | Status: DC | PRN
  Administered 2021-08-13: 4 mg via INTRAVENOUS

## 2021-08-13 MED ORDER — OXYCODONE HCL 5 MG PO TABS: 5.0000 mg | ORAL_TABLET | ORAL | Status: DC | PRN

## 2021-08-13 MED ORDER — HYDROMORPHONE HCL 1 MG/ML IJ SOLN
0.2500 mg | INTRAMUSCULAR | Status: DC | PRN
  Administered 2021-08-13 (×3): 0.25 mg via INTRAVENOUS

## 2021-08-13 MED ORDER — DEXAMETHASONE SODIUM PHOSPHATE 10 MG/ML IJ SOLN
INTRAMUSCULAR | Status: AC
  Filled 2021-08-13: qty 1

## 2021-08-13 MED ORDER — BUPIVACAINE HCL (PF) 0.25 % IJ SOLN
INTRAMUSCULAR | Status: DC | PRN
  Administered 2021-08-13: 10 mL

## 2021-08-13 MED ORDER — LACTATED RINGERS IV SOLN: INTRAVENOUS | Status: DC

## 2021-08-13 MED ORDER — FENTANYL CITRATE (PF) 100 MCG/2ML IJ SOLN
INTRAMUSCULAR | Status: AC
  Filled 2021-08-13: qty 2

## 2021-08-13 MED ORDER — EPHEDRINE 5 MG/ML INJ
INTRAVENOUS | Status: AC
  Filled 2021-08-13: qty 5

## 2021-08-13 MED ORDER — MIDAZOLAM HCL 2 MG/2ML IJ SOLN
INTRAMUSCULAR | Status: AC
  Filled 2021-08-13: qty 2

## 2021-08-13 MED ORDER — CELECOXIB 200 MG PO CAPS
ORAL_CAPSULE | ORAL | Status: AC
  Filled 2021-08-13: qty 2

## 2021-08-13 MED ORDER — CEFAZOLIN SODIUM-DEXTROSE 2-4 GM/100ML-% IV SOLN
INTRAVENOUS | Status: AC
  Filled 2021-08-13: qty 100

## 2021-08-13 MED ORDER — PROPOFOL 10 MG/ML IV BOLUS
INTRAVENOUS | Status: AC
  Filled 2021-08-13: qty 20

## 2021-08-13 MED ORDER — SIMETHICONE 80 MG PO CHEW: 80.0000 mg | CHEWABLE_TABLET | Freq: Four times a day (QID) | ORAL | Status: DC | PRN

## 2021-08-13 MED ORDER — PHENAZOPYRIDINE HCL 100 MG PO TABS
200.0000 mg | ORAL_TABLET | ORAL | Status: AC
  Administered 2021-08-13: 200 mg via ORAL

## 2021-08-13 MED ORDER — GABAPENTIN 300 MG PO CAPS
300.0000 mg | ORAL_CAPSULE | ORAL | Status: AC
  Administered 2021-08-13: 300 mg via ORAL

## 2021-08-13 MED ORDER — OXYCODONE HCL 5 MG/5ML PO SOLN: 5.0000 mg | Freq: Once | ORAL | Status: AC | PRN

## 2021-08-13 MED ORDER — KETOROLAC TROMETHAMINE 30 MG/ML IJ SOLN
30.0000 mg | Freq: Four times a day (QID) | INTRAMUSCULAR | Status: DC
  Administered 2021-08-13: 30 mg via INTRAVENOUS

## 2021-08-13 MED ORDER — OXYCODONE HCL 5 MG PO TABS
ORAL_TABLET | ORAL | Status: AC
  Filled 2021-08-13: qty 1

## 2021-08-13 MED ORDER — ONDANSETRON HCL 4 MG/2ML IJ SOLN
INTRAMUSCULAR | Status: AC
  Filled 2021-08-13: qty 2

## 2021-08-13 SURGICAL SUPPLY — 84 items
ADH SKN CLS APL DERMABOND .7 (GAUZE/BANDAGES/DRESSINGS) ×1
AGENT HMST KT MTR STRL THRMB (HEMOSTASIS)
APL ESCP 34 STRL LF DISP (HEMOSTASIS)
APL PRP STRL LF DISP 70% ISPRP (MISCELLANEOUS) ×1
APPLICATOR SURGIFLO ENDO (HEMOSTASIS) IMPLANT
BLADE SURG 15 STRL LF DISP TIS (BLADE) IMPLANT
BLADE SURG 15 STRL SS (BLADE)
CATH FOLEY 3WAY  5CC 16FR (CATHETERS)
CATH FOLEY 3WAY 5CC 16FR (CATHETERS) IMPLANT
CATH ROBINSON RED A/P 14FR (CATHETERS) IMPLANT
CHLORAPREP W/TINT 26 (MISCELLANEOUS) ×2 IMPLANT
COVER BACK TABLE 60X90IN (DRAPES) ×2 IMPLANT
COVER TIP SHEARS 8 DVNC (MISCELLANEOUS) ×1 IMPLANT
COVER TIP SHEARS 8MM DA VINCI (MISCELLANEOUS) ×2
DECANTER SPIKE VIAL GLASS SM (MISCELLANEOUS) IMPLANT
DEFOGGER SCOPE WARMER CLEARIFY (MISCELLANEOUS) ×2 IMPLANT
DERMABOND ADVANCED (GAUZE/BANDAGES/DRESSINGS) ×1
DERMABOND ADVANCED .7 DNX12 (GAUZE/BANDAGES/DRESSINGS) ×1 IMPLANT
DEVICE CAPIO SLIM SINGLE (INSTRUMENTS) IMPLANT
DRAPE ARM DVNC X/XI (DISPOSABLE) ×4 IMPLANT
DRAPE COLUMN DVNC XI (DISPOSABLE) ×1 IMPLANT
DRAPE DA VINCI XI ARM (DISPOSABLE) ×8
DRAPE DA VINCI XI COLUMN (DISPOSABLE) ×2
DRAPE SHEET LG 3/4 BI-LAMINATE (DRAPES) ×2 IMPLANT
DRAPE UTILITY XL STRL (DRAPES) ×2 IMPLANT
ELECT REM PT RETURN 9FT ADLT (ELECTROSURGICAL) ×2
ELECTRODE REM PT RTRN 9FT ADLT (ELECTROSURGICAL) ×1 IMPLANT
GAUZE 4X4 16PLY ~~LOC~~+RFID DBL (SPONGE) ×4 IMPLANT
GLOVE SURG ENC MOIS LTX SZ6 (GLOVE) ×10 IMPLANT
GLOVE SURG UNDER POLY LF SZ6.5 (GLOVE) ×14 IMPLANT
GOWN STRL REUS W/TWL LRG LVL3 (GOWN DISPOSABLE) ×2 IMPLANT
HIBICLENS CHG 4% 4OZ (MISCELLANEOUS) ×2 IMPLANT
HOOK RETRACTION 12 ELAST STAY (MISCELLANEOUS) IMPLANT
IRRIG SUCT STRYKERFLOW 2 WTIP (MISCELLANEOUS) ×4
IRRIGATION SUCT STRKRFLW 2 WTP (MISCELLANEOUS) ×2 IMPLANT
KIT TURNOVER CYSTO (KITS) ×2 IMPLANT
LEGGING LITHOTOMY PAIR STRL (DRAPES) ×2 IMPLANT
MANIFOLD NEPTUNE II (INSTRUMENTS) ×2 IMPLANT
MANIPULATOR ADVINCU DEL 2.5 PL (MISCELLANEOUS) IMPLANT
MANIPULATOR ADVINCU DEL 3.0 PL (MISCELLANEOUS) ×2 IMPLANT
MANIPULATOR ADVINCU DEL 3.5 PL (MISCELLANEOUS) IMPLANT
MANIPULATOR ADVINCU DEL 4.0 PL (MISCELLANEOUS) IMPLANT
MESH RESTORELLE Y CONTOUR (Mesh General) ×2 IMPLANT
NEEDLE HYPO 22GX1.5 SAFETY (NEEDLE) ×2 IMPLANT
NEEDLE INSUFFLATION 120MM (ENDOMECHANICALS) ×2 IMPLANT
NS IRRIG 1000ML POUR BTL (IV SOLUTION) ×2 IMPLANT
OBTURATOR OPTICAL STANDARD 8MM (TROCAR) ×2
OBTURATOR OPTICAL STND 8 DVNC (TROCAR) ×1
OBTURATOR OPTICALSTD 8 DVNC (TROCAR) ×1 IMPLANT
PACK CYSTO (CUSTOM PROCEDURE TRAY) IMPLANT
PACK ROBOT WH (CUSTOM PROCEDURE TRAY) ×2 IMPLANT
PACK ROBOTIC GOWN (GOWN DISPOSABLE) ×2 IMPLANT
PACK VAGINAL MINOR WOMEN LF (CUSTOM PROCEDURE TRAY) IMPLANT
PACK VAGINAL WOMENS (CUSTOM PROCEDURE TRAY) IMPLANT
PAD POSITIONING PINK XL (MISCELLANEOUS) ×2 IMPLANT
PAD PREP 24X48 CUFFED NSTRL (MISCELLANEOUS) ×2 IMPLANT
POUCH LAPAROSCOPIC INSTRUMENT (MISCELLANEOUS) ×2 IMPLANT
PROTECTOR NERVE ULNAR (MISCELLANEOUS) ×2 IMPLANT
RETRACTOR LONE STAR DISPOSABLE (INSTRUMENTS) IMPLANT
RETRACTOR STAY HOOK 5MM (MISCELLANEOUS) ×2 IMPLANT
SEAL CANN UNIV 5-8 DVNC XI (MISCELLANEOUS) ×4 IMPLANT
SEAL XI 5MM-8MM UNIVERSAL (MISCELLANEOUS) ×8
SET IRRIG Y TYPE TUR BLADDER L (SET/KITS/TRAYS/PACK) ×2 IMPLANT
SET TUBE SMOKE EVAC HIGH FLOW (TUBING) ×2 IMPLANT
SPONGE T-LAP 4X18 ~~LOC~~+RFID (SPONGE) IMPLANT
SUCTION FRAZIER HANDLE 10FR (MISCELLANEOUS) ×2
SUCTION TUBE FRAZIER 10FR DISP (MISCELLANEOUS) ×1 IMPLANT
SURGIFLO W/THROMBIN 8M KIT (HEMOSTASIS) IMPLANT
SUT ABS MONO DBL WITH NDL 48IN (SUTURE) IMPLANT
SUT CV-0 GORETEX TFX25 36 (SUTURE) ×2 IMPLANT
SUT DVC VLOC 180 0 12IN GS21 (SUTURE) ×2
SUT GORETEX NAB #0 THX26 36IN (SUTURE) ×2 IMPLANT
SUT MNCRL AB 4-0 PS2 18 (SUTURE) ×4 IMPLANT
SUT MON AB 2-0 SH 27 (SUTURE) ×2 IMPLANT
SUT VIC AB 0 CT1 27 (SUTURE)
SUT VIC AB 0 CT1 27XBRD ANTBC (SUTURE) IMPLANT
SUT VICRYL 2-0 SH 8X27 (SUTURE) ×2 IMPLANT
SUT VLOC 180 2-0 9IN GS21 (SUTURE) ×4 IMPLANT
SUTURE DVC VLC 180 0 12IN GS21 (SUTURE) ×1 IMPLANT
SYR BULB EAR ULCER 3OZ GRN STR (SYRINGE) ×2 IMPLANT
TOWEL OR 17X26 10 PK STRL BLUE (TOWEL DISPOSABLE) ×2 IMPLANT
TRAY FOLEY W/BAG SLVR 14FR (SET/KITS/TRAYS/PACK) ×2 IMPLANT
TROCAR XCEL NON BLADE 8MM B8LT (ENDOMECHANICALS) ×2 IMPLANT
TUBING TUR DISP (UROLOGICAL SUPPLIES) IMPLANT

## 2021-08-13 NOTE — Transfer of Care (Signed)
Immediate Anesthesia Transfer of Care Note  Patient: Savannah Rogers  Procedure(s) Performed: Procedure(s) (LRB): XI ROBOTIC ASSISTED TOTAL HYSTERECTOMY AND BILATERAL SALPINGECTOMY WITH SACROCOLPOPEXY (N/A) CYSTOSCOPY (N/A)  Patient Location: PACU  Anesthesia Type: General  Level of Consciousness: awake, oriented, sedated and patient cooperative  Airway & Oxygen Therapy: Patient Spontanous Breathing and Patient connected to face mask oxygen  Post-op Assessment: Report given to PACU RN and Post -op Vital signs reviewed and stable  Post vital signs: Reviewed and stable  Complications: No apparent anesthesia complications  Last Vitals:  Vitals Value Taken Time  BP 95/63 08/13/21 1048  Temp 36.3 C 08/13/21 1048  Pulse 72 08/13/21 1053  Resp 22 08/13/21 1053  SpO2 96 % 08/13/21 1053  Vitals shown include unvalidated device data.  Last Pain:  Vitals:   08/13/21 0615  TempSrc: Oral  PainSc: 0-No pain      Patients Stated Pain Goal: 7 (08/13/21 0615)  Complications: No notable events documented.

## 2021-08-13 NOTE — Anesthesia Procedure Notes (Signed)
Procedure Name: Intubation Date/Time: 08/13/2021 7:49 AM Performed by: Suan Halter, CRNA Pre-anesthesia Checklist: Patient identified, Emergency Drugs available, Suction available and Patient being monitored Patient Re-evaluated:Patient Re-evaluated prior to induction Oxygen Delivery Method: Circle system utilized Preoxygenation: Pre-oxygenation with 100% oxygen Induction Type: IV induction Ventilation: Mask ventilation without difficulty Laryngoscope Size: Mac and 3 Grade View: Grade I Tube type: Oral Tube size: 7.0 mm Number of attempts: 1 Airway Equipment and Method: Stylet and Oral airway Placement Confirmation: ETT inserted through vocal cords under direct vision, positive ETCO2 and breath sounds checked- equal and bilateral Secured at: 22 cm Tube secured with: Tape Dental Injury: Teeth and Oropharynx as per pre-operative assessment

## 2021-08-13 NOTE — Anesthesia Postprocedure Evaluation (Signed)
Anesthesia Post Note  Patient: Psychologist, occupational  Procedure(s) Performed: XI ROBOTIC ASSISTED TOTAL HYSTERECTOMY AND BILATERAL SALPINGECTOMY WITH SACROCOLPOPEXY CYSTOSCOPY     Patient location during evaluation: PACU Anesthesia Type: General Level of consciousness: awake and alert, oriented and patient cooperative Pain management: pain level controlled Vital Signs Assessment: post-procedure vital signs reviewed and stable Respiratory status: spontaneous breathing, nonlabored ventilation and respiratory function stable Cardiovascular status: blood pressure returned to baseline and stable Postop Assessment: no apparent nausea or vomiting Anesthetic complications: no   No notable events documented.  Last Vitals:  Vitals:   08/13/21 1048 08/13/21 1100  BP: 95/63 99/62  Pulse: 65 66  Resp: 20 20  Temp: (!) 36.3 C   SpO2: 96% 96%    Last Pain:  Vitals:   08/13/21 1100  TempSrc:   PainSc: 6                  Lannie Fields

## 2021-08-13 NOTE — Discharge Instructions (Addendum)

## 2021-08-13 NOTE — Interval H&P Note (Signed)
History and Physical Interval Note:  08/13/2021 7:13 AM  Savannah Rogers  has presented today for surgery, with the diagnosis of uterovaginal prolapse, incomplete; prolapse of anterior vaginal wall; prolapse of posterior vaginal wall; stress urinary incontinence.  The various methods of treatment have been discussed with the patient and family. After consideration of risks, benefits and other options for treatment, the patient has consented to  Procedure(s): XI ROBOTIC ASSISTED TOTAL HYSTERECTOMY AND BILATERAL SALPINGECTOMY WITH SACROCOLPOPEXY (N/A) CYSTOSCOPY (N/A) as a surgical intervention.    Vitals:   08/13/21 0615  BP: 134/86  Pulse: 74  Resp: 16  Temp: 98.7 F (37.1 C)  SpO2: 100%   Vitals:   08/13/21 0615  BP: 134/86  Pulse: 74  Resp: 16  Temp: 98.7 F (37.1 C)  SpO2: 100%    Gen: NAD CV: S1 S2 RRR Lungs: Clear to auscultation bilaterally Abd: soft, nontender   The patient's history has been reviewed, patient examined, no change in status, stable for surgery.  I have reviewed the patient's chart and labs.  Questions were answered to the patient's satisfaction.       [name]

## 2021-08-13 NOTE — Op Note (Signed)
Operative Note  Preoperative Diagnosis: anterior vaginal prolapse and uterovaginal prolapse, incomplete  Postoperative Diagnosis: same  Procedures performed:  Robotic assisted total laparoscopic hysterectomy with bilateral salpingectomy, sacrocolpopexy, cystoscopy  Implants:  Implant Name Type Inv. Item Serial No. Manufacturer Lot No. LRB No. Used Action  MESH [employer] - [id number] Mesh General MESH Alba Destine [id number] N/A 1 Implanted    Attending Surgeon: Lanetta Inch, MD  Assistant Surgeon: Annamaria Boots, MD  Anesthesia: General endotracheal  Findings: 1. On vaginal exam, stage III uterovaginal prolapse noted.   2. On cystoscopy, normal bladder and urethra without injury or lesion. Brisk bilateral ureteral efflux noted.    3. Small adhesions were noted at the right upper quadrant  Specimens:  ID Type Source Tests Collected by Time Destination  1 : uterus, cervix, bilateral tubes Tissue PATH Gyn benign resection SURGICAL PATHOLOGY [name], MD 08/13/2021 0850     Estimated blood loss: 50 mL  IV fluids: 700 mL  Urine output: 100 mL  Complications: none  Procedure in Detail:  After informed consent was obtained, the patient was taken to the operating room, where general anesthesia was induced and found to be adequate. She was placed in dorsolithotomy position in yellowfin stirrups. Her hips were noted not to be hyperflexed or hyperextended. Her arms were padded with gel pads and tucked to her sides. Her hands were surrounded by foam. A padded strap was placed across her chest with foam between the pad and her skin. She was noted to be appropriately positioned with all pressure points well padded and off tension. A tilt test showed no slippage. She was prepped and draped in the usual sterile fashion. A uterine manipulator was placed in the uterus after sounding to 8 cm, an appropriately sized Koh ring was placed around the  cervix, and a pneumo-occluder balloon was positioned in the vagina for later use.  A sterile Foley catheter was inserted.   0.25% plain Marcaine was injected in the supraumbilical  area and an 8 mm supraumbilical skin incision was made with the scalpel.  A Veress needle was inserted into the incision, CO2 insufflation was started, a low opening pressure was noted, and pneumoperitoneum was obtained. The Veress needle was removed and a 83mm robotic trocar was placed. The robotic camera was inserted and intraperitoneal placement was confirmed. Survey of the abdomen and pelvis revealed the findings as noted above, specifically mild adhesions in the right upper quadrant. The sacrum appeared to be free of any adhesive disease. After determining placement for the other ports, Local anesthetic was injected at each site and two 8 mm incisions were made for robotic ports at 10 cm lateral to and at the level of the umbilical port. Two additional 8 mm incisions were made 10 cm lateral to these and 30 degrees down followed by 8 mm robotic ports - the right side for an assistant port. All trocars were placed sequentially under direct visualization of the camera. The patient was placed in Trendelenburg. The robot was docked on the patient's right side. Monopolar endoshears were placed in the right arm, a [place] bipolar grasper was placed in the 2nd arm of the patient's left side, and a Tip up grasper was placed in the 3rd arm on the patient's left side.   Attention was then turned to the robotic hysterectomy. The left fallopian tube was grasped. The mesosalpinx was cauterized and cut to remove the tube from its attachments.  The left  round ligament was grasped, cauterized, and transected with electrocautery. The ureter was identified and was found to be well away from the planned site of incision. Using the monopolar scissors, a window was made on the posterior leaf of the broad ligament. The infundibulopelvic ligament was  cauterized and transected. The anterior and posterior leaves of the broad ligament were taken down with cautery and sharp dissection. The uterine artery was skeletonized and the bladder flap was created on the right side with a combination of electrosurgery and sharp dissection. The KOH ring was identified. The right uterine artery was clamped, cauterized, and transected. In a similar fashion, the right side was taken down. Further sharp dissection with combination of cautery was performed to further develop the bladder flap. At this point, the KOH ring was completely hugging the cervix. The pneumo-occluder balloon in the vagina was inflated to maintain pneumoperitoneum. A colpotomy was performed with electrosurgical cutting current and the uterus and cervix were completely amputated from the vagina. The specimen was delivered through the vagina. The posterior portion of the vaginal cuff was then grasped and pulled up to maintain pneumoperitoneum. The pneumo-occluder balloon was then replaced in the vagina. The right hand instrument was changed to a suture-cut needle driver. The vaginal cuff was then closed using a 0 V-lock suture.    The right hand instrument was replaced with monopolar endoshears. Attention was then turned to the sacral promontory. The peritoneum overlying the sacral promontory was tented up, dissected sharply with monopolar scissors and electrosurgery using layer by layer technique.  The anterior vaginal dissection was then performed with sharp dissection and electrosurgery. Attention was then returned to the sacral promontory, which was palpated with an assistant grasper to confirm correct location.  The overlying areolar and adipose tissue were taken down until the anterior longitudinal sacral ligament was identified. Small vessels were cauterized along the way to obtain excellent hemostasis. The peritoneal incision was extended down to the posterior cul-de-sac. This was performed with care  to avoid the ureter on the right side and the sigmoid colon and its mesentary on the left side. With a lucite probe in the vagina, the posterior vaginal dissection was then performed with sharp dissection and electrosurgery in order to dissect the rectum away from the posterior vagina. A "Y" mesh was then inserted into the abdomen after trimming to appropriate size. With the probe in the vagina, the anterior leaf of the Y mesh was affixed to the anterior portion of the vagina using a 2-0 v-loc suture in a spiral pattern to distribute the suture evenly across the surface of the anterior mesh leaf. In a similar fashion, the posterior leaf of the Y mesh was attached to the posterior surface of the vagina with 2-0 v-loc suture. The distal end of the mesh was then brought to overlie the sacrum. The correct amount of tension was determined in order to elevate the vagina, but not put the mesh under tension. The distal end of the mesh was then affixed to the anterior longitudinal sacral ligament using two interrupted transverse stitches of CV2 Gortex. The excess distal mesh was then cut and removed. The peritoneum was reapproximated over the mesh using 2-0 monocryl. The bladder flap was incorporated to completely retroperitonealize the mesh. All pedicles were carefully inspected and noted to be hemostatic as the CO2 gas was deflated. All instruments were removed from the patient's abdomen.   The Foley catheter was removed.  A 70-degree cystoscope was introduced, and 360-degree inspection revealed no  injury, lesion or foreign body in the bladder. Brisk bilateral ureteral efflux was noted with the assistance of pyridium.  The bladder was drained and the cystoscope was removed.  The Foley catheter was replaced.  The robot was undocked. The CO2 gas was removed and the ports were removed.  The skin incisions were closed with subcutaneous stitches of 4-0 Monocryl and covered with skin glue.  Sponge, lap, and needle counts  were correct x 2. The patient tolerated the procedure well. She was awakened from anesthesia and transferred to the recovery room in stable condition.    [name], MD

## 2021-08-14 ENCOUNTER — Encounter (HOSPITAL_BASED_OUTPATIENT_CLINIC_OR_DEPARTMENT_OTHER): Payer: Self-pay | Admitting: Obstetrics and Gynecology

## 2021-08-14 ENCOUNTER — Telehealth: Payer: Self-pay | Admitting: Obstetrics and Gynecology

## 2021-08-14 LAB — SURGICAL PATHOLOGY

## 2021-08-14 NOTE — Telephone Encounter (Signed)
Savannah Rogers underwent Robotic assisted Total laparoscopic hysterectomy with bilateral salpingectomy, sacrocolpopexy and cystoscopy on 08/13/21.   Savannah Rogers passed Savannah Rogers voiding trial.  was backfilled into the bladder Savannah Rogers missed the hat when voiding  PVR by bladder scan was 52ml.   Savannah Rogers was discharged without a catheter. Please call Savannah Rogers for a routine post op check. Thanks!  [name], MD

## 2021-08-15 NOTE — Telephone Encounter (Signed)
LMOVM x 2 for pt to call office

## 2021-08-21 ENCOUNTER — Telehealth (HOSPITAL_BASED_OUTPATIENT_CLINIC_OR_DEPARTMENT_OTHER): Payer: Self-pay | Admitting: *Deleted

## 2021-08-21 NOTE — Telephone Encounter (Signed)
Pt called stating that she was having some itching where her stitches are. Advised that it can be normal to have some itching when healing begins. Pt denies vaginal bleeding, has normal d/c, no pain, and is voiding without difficulty. Advised that she could try using ice to the area to change the sensation of itching, but only for a short time. Advised to call back if symptoms worsen or do not improve. Pt verbalized understanding.

## 2021-08-29 ENCOUNTER — Other Ambulatory Visit: Payer: Self-pay | Admitting: Internal Medicine

## 2021-09-03 ENCOUNTER — Encounter: Payer: Self-pay | Admitting: *Deleted

## 2021-09-06 ENCOUNTER — Other Ambulatory Visit: Payer: Self-pay | Admitting: Internal Medicine

## 2021-09-16 ENCOUNTER — Other Ambulatory Visit: Payer: Self-pay | Admitting: Internal Medicine

## 2021-09-18 ENCOUNTER — Other Ambulatory Visit: Payer: Self-pay | Admitting: Internal Medicine

## 2021-09-20 ENCOUNTER — Other Ambulatory Visit: Payer: BC Managed Care – PPO

## 2021-09-22 NOTE — Progress Notes (Signed)
Subjective:    Patient ID: Savannah Rogers, female    DOB: May 24, 1966, 55 y.o.   MRN: [id number]   This visit occurred during the SARS-CoV-2 public health emergency.  Safety protocols were in place, including screening questions prior to the visit, additional usage of staff PPE, and extensive cleaning of exam room while observing appropriate contact time as indicated for disinfecting solutions.    HPI She is here for a physical exam.   Sugars at home 102-116 at home.  She was wondering if we could decrease her sugar medications because her sugars have been well controlled at home.  She did have her complete hysterectomy last month and has been doing well.  She has not started exercising since surgery-she has follow-up with her surgeon tomorrow.  Medications and allergies reviewed with patient and updated if appropriate.  Patient Active Problem List   Diagnosis Date Noted   S/P hysterectomy 09/23/2021   Uterovaginal prolapse, incomplete 08/13/2021   Brittle nails 06/24/2021   Second degree burn of nose, initial encounter 68/34/1962   Umbilical hernia without obstruction and without gangrene 12/14/2018   Gastroesophageal reflux disease 12/14/2018   Diabetes ([place]) 09/09/2017   Pancreatitis 03/03/2017   Abnormal CXR 04/19/2016   Upper airway cough syndrome likely related to allergic rhinitis  04/10/2016   Essential hypertension, benign 03/05/2016   Non-allergic rhinitis 06/13/2015   Vitamin D deficiency 05/22/2015   Hyperuricemia 03/24/2013   Mixed hyperlipidemia 03/06/2008    Current Outpatient Medications on File Prior to Visit  Medication Sig Dispense Refill   acetaminophen (TYLENOL) 500 MG tablet Take 1 tablet (500 mg total) by mouth every 6 (six) hours as needed (pain). 30 tablet 0   allopurinol (ZYLOPRIM) 100 MG tablet Take 1 tablet (100 mg total) by mouth daily. 90 tablet 1   atorvastatin (LIPITOR) 20 MG tablet TAKE 1 TABLET BY MOUTH EVERY DAY 90 tablet 1    dapagliflozin propanediol (FARXIGA) 10 MG TABS tablet Take 1 tablet (10 mg total) by mouth daily before breakfast. 90 tablet 1   Glucose Blood (BLOOD GLUCOSE TEST STRIPS) STRP One Touch. Test sugars once daily as directed. E11.9 90 strip 3   ibuprofen (ADVIL) 600 MG tablet Take 1 tablet (600 mg total) by mouth every 6 (six) hours as needed. 30 tablet 0   metFORMIN (GLUCOPHAGE-XR) 500 MG 24 hr tablet TAKE 2 TABLETS (1,000 MG TOTAL) BY MOUTH 2 (TWO) TIMES DAILY BEFORE A MEAL. 360 tablet 1   polyethylene glycol powder (GLYCOLAX/MIRALAX) 17 GM/SCOOP powder Take 17 g by mouth daily. Drink 17g (1 scoop) dissolved in water per day. 255 g 0   telmisartan (MICARDIS) 40 MG tablet TAKE 1 TABLET BY MOUTH EVERY DAY 90 tablet 1   VASCEPA 1 g capsule TAKE 2 CAPSULES BY MOUTH 2 TIMES DAILY. 120 capsule 5   No current facility-administered medications on file prior to visit.    Past Medical History:  Diagnosis Date   COVID 10/2020   cough fever x 10 days all symptoms resolved   Hyperlipidemia    Hypertension    type 2 dm    Umbilical hernia 22/97/9892   small no problems with per pt    Past Surgical History:  Procedure Laterality Date   BONE MARROW BIOPSY  2006   for elevated WBC   CHOLECYSTECTOMY  03/16/2012   INTRAOPERATIVE CHOLANGIOGRAM;  Surgeon: Earnstine Regal, MD;  Location: [facility];  Service: General;  Laterality: N/A;   CYSTOSCOPY N/A 08/13/2021   Procedure:  CYSTOSCOPY;  Surgeon: Jaquita Folds, MD;  [facility];  Service: Gynecology;  Laterality: N/A;   G3  P1  A2     laparascopic cholecystectomy  03/2012   Dr Harlow Asa   XI ROBOTIC ASSISTED TOTAL HYSTERECTOMY WITH SACROCOLPOPEXY N/A 08/13/2021   Procedure: XI ROBOTIC ASSISTED TOTAL HYSTERECTOMY AND BILATERAL SALPINGECTOMY WITH SACROCOLPOPEXY;  Surgeon: Jaquita Folds, MD;  Location: [facility];  Service: Gynecology;  Laterality: N/A;    Social History   Socioeconomic History   Marital  status: Single    Spouse name: Not on file   Number of children: 1   Years of education: Not on file   Highest education level: Not on file  Occupational History   Occupation:  HOTEL MANAGER    Employer: [employer]   Occupation: Best boy: [employer]  Tobacco Use   Smoking status: Never   Smokeless tobacco: Never  Vaping Use   Vaping Use: Never used  Substance and Sexual Activity   Alcohol use: No    Alcohol/week: 0.0 standard drinks   Drug use: No   Sexual activity: Yes    Birth control/protection: None  Other Topics Concern   Not on file  Social History Narrative   Not on file   Social Determinants of Health   Financial Resource Strain: Not on file  Food Insecurity: Not on file  Transportation Needs: Not on file  Physical Activity: Not on file  Stress: Not on file  Social Connections: Not on file    Family History  Problem Relation Age of Onset   Stroke Paternal Grandmother 83   Stroke Paternal Grandfather 50   Hypertension Father    Hypertension Mother    Breast cancer Maternal Aunt    Diabetes Neg Hx    Heart disease Neg Hx     Review of Systems  Constitutional:  Negative for chills and fever.  Eyes:  Negative for visual disturbance.  Respiratory:  Negative for cough, shortness of breath and wheezing.   Cardiovascular:  Negative for chest pain, palpitations and leg swelling.  Gastrointestinal:  Negative for abdominal pain, blood in stool, constipation, diarrhea and nausea.       No gerd  Genitourinary:  Negative for dysuria.  Musculoskeletal:  Positive for arthralgias (left knee). Negative for back pain.  Skin:  Negative for color change and rash.  Neurological:  Negative for dizziness, light-headedness, numbness and headaches.  Psychiatric/Behavioral:  Negative for dysphoric mood. The patient is not nervous/anxious.       Objective:   Vitals:   09/23/21 0955  BP: 130/82  Pulse: 80  Temp: 97.9 F (36.6 C)  SpO2: 99%   Filed  Weights   09/23/21 0955  Weight: 137 lb (62.1 kg)   Body mass index is 25.06 kg/m.  BP Readings from Last 3 Encounters:  09/23/21 130/82  08/13/21 115/82  08/09/21 137/81    Wt Readings from Last 3 Encounters:  09/23/21 137 lb (62.1 kg)  08/13/21 137 lb 14.4 oz (62.6 kg)  08/09/21 138 lb 6 oz (62.8 kg)    Depression screen [facility] 2/9 09/23/2021 09/19/2020 09/19/2019 09/15/2018 09/09/2017  Decreased Interest 0 0 0 0 0  Down, Depressed, Hopeless 0 0 0 0 0  PHQ - 2 Score 0 0 0 0 0  Altered sleeping 0 - - 0 -  Tired, decreased energy 0 - - 0 -  Change in appetite 0 - - 0 -  Feeling  bad or failure about yourself  0 - - 0 -  Trouble concentrating 0 - - 0 -  Moving slowly or fidgety/restless 0 - - 0 -  Suicidal thoughts 0 - - 0 -  PHQ-9 Score 0 - - 0 -    GAD 7 : Generalized Anxiety Score 09/23/2021  Nervous, Anxious, on Edge 0  Control/stop worrying 0  Worry too much - different things 0  Trouble relaxing 0  Restless 0  Easily annoyed or irritable 0  Afraid - awful might happen 0  Total GAD 7 Score 0       Physical Exam Constitutional: She appears well-developed and well-nourished. No distress.  HENT:  Head: Normocephalic and atraumatic.  Right Ear: External ear normal. Normal ear canal and TM Left Ear: External ear normal.  Normal ear canal and TM Mouth/Throat: Oropharynx is clear and moist.  Eyes: Conjunctivae and EOM are normal.  Neck: Neck supple. No tracheal deviation present. No thyromegaly present.  No carotid bruit  Cardiovascular: Normal rate, regular rhythm and normal heart sounds.   No murmur heard.  No edema. Pulmonary/Chest: Effort normal and breath sounds normal. No respiratory distress. She has no wheezes. She has no rales.  Breast: deferred   Abdominal: Soft. She exhibits no distension. There is no tenderness.  Lymphadenopathy: She has no cervical adenopathy.  Skin: Skin is warm and dry. She is not diaphoretic.  Psychiatric: She has a normal mood  and affect. Her behavior is normal.     Lab Results  Component Value Date   WBC 12.1 (H) 08/09/2021   HGB 14.1 08/09/2021   HCT 42.0 08/09/2021   PLT 365 08/09/2021   GLUCOSE 150 (H) 08/09/2021   CHOL 137 06/19/2021   TRIG 254.0 (H) 06/19/2021   HDL 42.60 06/19/2021   LDLDIRECT 67.0 06/19/2021   LDLCALC 87 08/28/2016   ALT 34 06/19/2021   AST 25 06/19/2021   NA 139 08/09/2021   K 4.3 08/09/2021   CL 103 08/09/2021   CREATININE 0.74 08/09/2021   BUN 22 (H) 08/09/2021   CO2 25 08/09/2021   TSH 2.60 06/19/2021   HGBA1C 7.3 (H) 06/19/2021   MICROALBUR 0.9 08/28/2016         Assessment & Plan:   Physical exam: Screening blood work  ordered Exercise  none - since TAH last month Weight  good Substance abuse  none   Screened for depression using the PHQ 9 scale.  No evidence of depression.   Screened for anxiety using GAD7 Scale.  No evidence of anxiety.   Reviewed recommended immunizations.  Flu and Prevnar today.   Health Maintenance  Topic Date Due   Hepatitis C Screening  Never done   Pneumococcal Vaccine 41-89 Years old (2 - PCV) 03/11/2019   FOOT EXAM  09/16/2019   OPHTHALMOLOGY EXAM  12/10/2019   INFLUENZA VACCINE  07/01/2021   Zoster Vaccines- Shingrix (1 of 2) 09/24/2021 (Originally 12/30/2014)   COVID-19 Vaccine (3 - Booster for [employer] series) 10/09/2021 (Originally 04/30/2020)   HEMOGLOBIN A1C  12/20/2021   MAMMOGRAM  11/20/2022   COLONOSCOPY (Pts 45-63yr Insurance coverage will need to be confirmed)  08/09/2025   TETANUS/TDAP  09/10/2027   HIV Screening  Completed   HPV VACCINES  Aged Out          See Problem List for Assessment and Plan of chronic medical problems.

## 2021-09-22 NOTE — Patient Instructions (Addendum)
Flu immunization and pneumonia vaccines administered today.     Blood work was ordered.     Medications changes include :   none    Please followup in 6 months    Health Maintenance, Female Adopting a healthy lifestyle and getting preventive care are important in promoting health and wellness. Ask your health care provider about: The right schedule for you to have regular tests and exams. Things you can do on your own to prevent diseases and keep yourself healthy. What should I know about diet, weight, and exercise? Eat a healthy diet  Eat a diet that includes plenty of vegetables, fruits, low-fat dairy products, and lean protein. Do not eat a lot of foods that are high in solid fats, added sugars, or sodium. Maintain a healthy weight Body mass index (BMI) is used to identify weight problems. It estimates body fat based on height and weight. Your health care provider can help determine your BMI and help you achieve or maintain a healthy weight. Get regular exercise Get regular exercise. This is one of the most important things you can do for your health. Most adults should: Exercise for at least 150 minutes each week. The exercise should increase your heart rate and make you sweat (moderate-intensity exercise). Do strengthening exercises at least twice a week. This is in addition to the moderate-intensity exercise. Spend less time sitting. Even light physical activity can be beneficial. Watch cholesterol and blood lipids Have your blood tested for lipids and cholesterol at 56 years of age, then have this test every 5 years. Have your cholesterol levels checked more often if: Your lipid or cholesterol levels are high. You are older than 56 years of age. You are at high risk for heart disease. What should I know about cancer screening? Depending on your health history and family history, you may need to have cancer screening at various ages. This may include screening  for: Breast cancer. Cervical cancer. Colorectal cancer. Skin cancer. Lung cancer. What should I know about heart disease, diabetes, and high blood pressure? Blood pressure and heart disease High blood pressure causes heart disease and increases the risk of stroke. This is more likely to develop in people who have high blood pressure readings, are of African descent, or are overweight. Have your blood pressure checked: Every 3-5 years if you are 32-2 years of age. Every year if you are 27 years old or older. Diabetes Have regular diabetes screenings. This checks your fasting blood sugar level. Have the screening done: Once every three years after age 30 if you are at a normal weight and have a low risk for diabetes. More often and at a younger age if you are overweight or have a high risk for diabetes. What should I know about preventing infection? Hepatitis B If you have a higher risk for hepatitis B, you should be screened for this virus. Talk with your health care provider to find out if you are at risk for hepatitis B infection. Hepatitis C Testing is recommended for: Everyone born from [birth year] through 1966. Anyone with known risk factors for hepatitis C. Sexually transmitted infections (STIs) Get screened for STIs, including gonorrhea and chlamydia, if: You are sexually active and are younger than 56 years of age. You are older than 56 years of age and your health care provider tells you that you are at risk for this type of infection. Your sexual activity has changed since you were last screened, and you are at increased  risk for chlamydia or gonorrhea. Ask your health care provider if you are at risk. Ask your health care provider about whether you are at high risk for HIV. Your health care provider may recommend a prescription medicine to help prevent HIV infection. If you choose to take medicine to prevent HIV, you should first get tested for HIV. You should then be tested every 3  months for as long as you are taking the medicine. Pregnancy If you are about to stop having your period (premenopausal) and you may become pregnant, seek counseling before you get pregnant. Take 400 to 800 micrograms (mcg) of folic acid every day if you become pregnant. Ask for birth control (contraception) if you want to prevent pregnancy. Osteoporosis and menopause Osteoporosis is a disease in which the bones lose minerals and strength with aging. This can result in bone fractures. If you are 40 years old or older, or if you are at risk for osteoporosis and fractures, ask your health care provider if you should: Be screened for bone loss. Take a calcium or vitamin D supplement to lower your risk of fractures. Be given hormone replacement therapy (HRT) to treat symptoms of menopause. Follow these instructions at home: Lifestyle Do not use any products that contain nicotine or tobacco, such as cigarettes, e-cigarettes, and chewing tobacco. If you need help quitting, ask your health care provider. Do not use street drugs. Do not share needles. Ask your health care provider for help if you need support or information about quitting drugs. Alcohol use Do not drink alcohol if: Your health care provider tells you not to drink. You are pregnant, may be pregnant, or are planning to become pregnant. If you drink alcohol: Limit how much you use to 0-1 drink a day. Limit intake if you are breastfeeding. Be aware of how much alcohol is in your drink. In the [place] one drink equals one 12 oz bottle of beer (355 mL), one 5 oz glass of wine (148 mL), or one 1 oz glass of hard liquor (44 mL). General instructions Schedule regular health, dental, and eye exams. Stay current with your vaccines. Tell your health care provider if: You often feel depressed. You have ever been abused or do not feel safe at home. Summary Adopting a healthy lifestyle and getting preventive care are important in promoting  health and wellness. Follow your health care provider's instructions about healthy diet, exercising, and getting tested or screened for diseases. Follow your health care provider's instructions on monitoring your cholesterol and blood pressure. This information is not intended to replace advice given to you by your health care provider. Make sure you discuss any questions you have with your health care provider. Document Revised: 01/25/2021 Document Reviewed: 11/10/2018 Elsevier Patient Education  2022 [employer].

## 2021-09-23 ENCOUNTER — Ambulatory Visit (INDEPENDENT_AMBULATORY_CARE_PROVIDER_SITE_OTHER): Payer: BC Managed Care – PPO | Admitting: Internal Medicine

## 2021-09-23 ENCOUNTER — Encounter: Payer: Self-pay | Admitting: Internal Medicine

## 2021-09-23 ENCOUNTER — Other Ambulatory Visit: Payer: Self-pay

## 2021-09-23 VITALS — BP 130/82 | HR 80 | Temp 97.9°F | Ht 62.0 in | Wt 137.0 lb

## 2021-09-23 DIAGNOSIS — I10 Essential (primary) hypertension: Secondary | ICD-10-CM

## 2021-09-23 DIAGNOSIS — Z23 Encounter for immunization: Secondary | ICD-10-CM | POA: Diagnosis not present

## 2021-09-23 DIAGNOSIS — E1165 Type 2 diabetes mellitus with hyperglycemia: Secondary | ICD-10-CM | POA: Diagnosis not present

## 2021-09-23 DIAGNOSIS — Z90710 Acquired absence of both cervix and uterus: Secondary | ICD-10-CM | POA: Insufficient documentation

## 2021-09-23 DIAGNOSIS — Z1159 Encounter for screening for other viral diseases: Secondary | ICD-10-CM

## 2021-09-23 DIAGNOSIS — K219 Gastro-esophageal reflux disease without esophagitis: Secondary | ICD-10-CM

## 2021-09-23 DIAGNOSIS — R5383 Other fatigue: Secondary | ICD-10-CM

## 2021-09-23 DIAGNOSIS — E559 Vitamin D deficiency, unspecified: Secondary | ICD-10-CM | POA: Diagnosis not present

## 2021-09-23 DIAGNOSIS — E782 Mixed hyperlipidemia: Secondary | ICD-10-CM | POA: Diagnosis not present

## 2021-09-23 DIAGNOSIS — E790 Hyperuricemia without signs of inflammatory arthritis and tophaceous disease: Secondary | ICD-10-CM | POA: Diagnosis not present

## 2021-09-23 DIAGNOSIS — Z1331 Encounter for screening for depression: Secondary | ICD-10-CM

## 2021-09-23 DIAGNOSIS — Z0000 Encounter for general adult medical examination without abnormal findings: Secondary | ICD-10-CM

## 2021-09-23 LAB — CBC WITH DIFFERENTIAL/PLATELET
Basophils Absolute: 0.1 10*3/uL (ref 0.0–0.1)
Basophils Relative: 1 % (ref 0.0–3.0)
Eosinophils Absolute: 0.4 10*3/uL (ref 0.0–0.7)
Eosinophils Relative: 3 % (ref 0.0–5.0)
HCT: 43.3 % (ref 36.0–46.0)
Hemoglobin: 14.4 g/dL (ref 12.0–15.0)
Lymphocytes Relative: 38.2 % (ref 12.0–46.0)
Lymphs Abs: 4.8 10*3/uL — ABNORMAL HIGH (ref 0.7–4.0)
MCHC: 33.2 g/dL (ref 30.0–36.0)
MCV: 86.9 fl (ref 78.0–100.0)
Monocytes Absolute: 0.5 10*3/uL (ref 0.1–1.0)
Monocytes Relative: 3.9 % (ref 3.0–12.0)
Neutro Abs: 6.8 10*3/uL (ref 1.4–7.7)
Neutrophils Relative %: 53.9 % (ref 43.0–77.0)
Platelets: 378 10*3/uL (ref 150.0–400.0)
RBC: 4.98 Mil/uL (ref 3.87–5.11)
RDW: 13.5 % (ref 11.5–15.5)
WBC: 12.7 10*3/uL — ABNORMAL HIGH (ref 4.0–10.5)

## 2021-09-23 LAB — COMPREHENSIVE METABOLIC PANEL
ALT: 35 U/L (ref 0–35)
AST: 26 U/L (ref 0–37)
Albumin: 4.9 g/dL (ref 3.5–5.2)
Alkaline Phosphatase: 94 U/L (ref 39–117)
BUN: 16 mg/dL (ref 6–23)
CO2: 25 mEq/L (ref 19–32)
Calcium: 10.6 mg/dL — ABNORMAL HIGH (ref 8.4–10.5)
Chloride: 98 mEq/L (ref 96–112)
Creatinine, Ser: 0.6 mg/dL (ref 0.40–1.20)
GFR: 100.12 mL/min (ref >60.00)
Glucose, Bld: 95 mg/dL (ref 70–99)
Potassium: 4.1 mEq/L (ref 3.5–5.1)
Sodium: 137 mEq/L (ref 135–145)
Total Bilirubin: 0.6 mg/dL (ref 0.2–1.2)
Total Protein: 8.7 g/dL — ABNORMAL HIGH (ref 6.0–8.3)

## 2021-09-23 LAB — LIPID PANEL
Cholesterol: 169 mg/dL (ref 0–200)
HDL: 47.3 mg/dL (ref >39.00)
NonHDL: 122.06
Total CHOL/HDL Ratio: 4
Triglycerides: 279 mg/dL — ABNORMAL HIGH (ref 0.0–149.0)
VLDL: 55.8 mg/dL — ABNORMAL HIGH (ref 0.0–40.0)

## 2021-09-23 LAB — MICROALBUMIN / CREATININE URINE RATIO
Creatinine,U: 12.9 mg/dL
Microalb Creat Ratio: 5.4 mg/g (ref 0.0–30.0)
Microalb, Ur: <0.7 mg/dL (ref 0.0–1.9)

## 2021-09-23 LAB — LDL CHOLESTEROL, DIRECT: Direct LDL: 94 mg/dL

## 2021-09-23 LAB — HEMOGLOBIN A1C: Hgb A1c MFr Bld: 6.9 % — ABNORMAL HIGH (ref 4.6–6.5)

## 2021-09-23 LAB — URIC ACID: Uric Acid, Serum: 5.9 mg/dL (ref 2.4–7.0)

## 2021-09-23 LAB — VITAMIN D 25 HYDROXY (VIT D DEFICIENCY, FRACTURES): VITD: 54.06 ng/mL (ref 30.00–100.00)

## 2021-09-23 NOTE — Assessment & Plan Note (Signed)
Chronic °Regular exercise and healthy diet encouraged °Check lipid panel  °Continue atorvastatin 20 mg daily °

## 2021-09-23 NOTE — Assessment & Plan Note (Signed)
Chronic Blood pressure well controlled CMP Continue Micardis 40 mg daily

## 2021-09-23 NOTE — Addendum Note (Signed)
Addended by: Karma Ganja on: 09/23/2021 04:42 PM   Modules accepted: Orders

## 2021-09-23 NOTE — Assessment & Plan Note (Signed)
Chronic Sugars well controlled at home, but A1c has not been at goal up until this point Check A1c today Continue Farxiga 10 mg daily, metformin XR 1000 mg twice daily Restart exercise once able Diabetic diet

## 2021-09-23 NOTE — Assessment & Plan Note (Signed)
Chronic Never had gout Uric acid level Continue allopurinol 100 mg daily - we can consider trying to cut this down to 50 mg daily

## 2021-09-23 NOTE — Assessment & Plan Note (Signed)
Chronic Taking vitamin D daily Check vitamin D level  

## 2021-09-23 NOTE — Assessment & Plan Note (Signed)
Acute Having some fatigue since her surgery last month-complete hysterectomy Will check basic blood work and iron panel

## 2021-09-24 ENCOUNTER — Encounter: Payer: Self-pay | Admitting: Obstetrics and Gynecology

## 2021-09-24 ENCOUNTER — Ambulatory Visit (INDEPENDENT_AMBULATORY_CARE_PROVIDER_SITE_OTHER): Payer: BC Managed Care – PPO | Admitting: Obstetrics and Gynecology

## 2021-09-24 VITALS — BP 153/91 | HR 83

## 2021-09-24 DIAGNOSIS — N952 Postmenopausal atrophic vaginitis: Secondary | ICD-10-CM

## 2021-09-24 DIAGNOSIS — Z9889 Other specified postprocedural states: Secondary | ICD-10-CM

## 2021-09-24 LAB — IRON,TIBC AND FERRITIN PANEL
%SAT: 23 % (calc) (ref 16–45)
Ferritin: 29 ng/mL (ref 16–232)
Iron: 106 ug/dL (ref 45–160)
TIBC: 466 mcg/dL (calc) — ABNORMAL HIGH (ref 250–450)

## 2021-09-24 LAB — HEPATITIS C ANTIBODY
Hepatitis C Ab: NONREACTIVE
SIGNAL TO CUT-OFF: 0.03 (ref <1.00)

## 2021-09-24 MED ORDER — ESTRADIOL 0.1 MG/GM VA CREA: TOPICAL_CREAM | VAGINAL | 11 refills | Status: AC

## 2021-09-24 NOTE — Progress Notes (Signed)
[facility]  Date of Visit: 09/24/2021  History of Present Illness: Savannah Rogers is a 55 y.o. female scheduled today for a post-operative visit.   Surgery: s/p Robotic assisted total laparoscopic hysterectomy with bilateral salpingectomy, sacrocolpopexy, cystoscopy on 08/13/21  She passed her postoperative void trial.   Postoperative course has been uncomplicated.   Today she reports she is doing well, has no complaints.   UTI in the last 6 weeks? No  Pain? No  She has returned to her normal activity (except for postop restrictions) Vaginal bulge? No  Stress incontinence: No  Urgency/frequency: No  Urge incontinence: No  Voiding dysfunction: No  Bowel issues: No   Subjective Success: Do you usually have a bulge or something falling out that you can see or feel in the vaginal area? No  Retreatment Success: Any retreatment with surgery or pessary for any compartment? No   Pathology results: . UTERUS, CERVIX, BILATERAL FALLOPIAN TUBES, HYSTERECTOMY AND  SALPINGECTOMY:  - Cervix      Slight cervicitis and squamous metaplasia.      No dysplasia or malignancy.  - Endometrium      Inactive.      No hyperplasia or carcinoma.  - Myometrium      Adenomyosis.      No evidence of malignancy.  - Bilateral Fallopian tubes      Unremarkable.      No endometriosis or malignancy.   Medications: She has a current medication list which includes the following prescription(s): acetaminophen, allopurinol, atorvastatin, dapagliflozin propanediol, estradiol, blood glucose test strips, ibuprofen, metformin, polyethylene glycol powder, telmisartan, and vascepa.   Allergies: Patient has No Known Allergies.   Physical Exam: BP (!) 153/91   Pulse 83   LMP  (LMP Unknown) Comment: LMP 2018  Abdomen: soft, non-tender, without masses or organomegaly Incisions: healing well.  Pelvic Examination: urethral meatus appears atrophic with mild erythema. Vagina: Sutures are present at the cuff  and there is not granulation tissue. Loop of suture also present along the posterior vaginal wall.  No visible or palpable mesh.  POP-Q: POP-Q  -3                                            Aa   -3                                           Ba  -8                                              C   3.5                                            Gh  4                                            Pb  8  tvl   -2                                            Ap  -2                                            Bp                                                 D    ---------------------------------------------------------  Assessment and Plan:  1. Post-operative state   2. Vaginal atrophy     - Pathology results were reviewed with the patient today and she verbalized understanding that the results were benign.  - She has access to her operative note and pathology report through my chart.  - Can resume regular activity including exercise, gradually return to activity.  - Sutures still present in vagina, and tissue appears atrophic. Will order estrace cream 0.5g to place vaginally nightly for two weeks then twice weekly after.  - Discussed avoidance of heavy lifting and straining long term to reduce the risk of recurrence.   Return 3 weeks for follow up and repeat exam.   [name], MD

## 2021-09-24 NOTE — Patient Instructions (Signed)
Place vaginal estrogen, pea sized amount, every night for two weeks then twice a week after.

## 2021-09-26 ENCOUNTER — Encounter: Payer: Self-pay | Admitting: Internal Medicine

## 2021-09-26 ENCOUNTER — Ambulatory Visit (INDEPENDENT_AMBULATORY_CARE_PROVIDER_SITE_OTHER): Payer: BC Managed Care – PPO | Admitting: Internal Medicine

## 2021-09-26 ENCOUNTER — Other Ambulatory Visit: Payer: Self-pay

## 2021-09-26 DIAGNOSIS — N611 Abscess of the breast and nipple: Secondary | ICD-10-CM | POA: Diagnosis not present

## 2021-09-26 MED ORDER — CEPHALEXIN 500 MG PO CAPS: 500.0000 mg | ORAL_CAPSULE | Freq: Two times a day (BID) | ORAL | 0 refills | Status: AC

## 2021-09-26 NOTE — Patient Instructions (Signed)
We have sent in keflex to take 1 pill twice a day for 1 week.   

## 2021-09-26 NOTE — Progress Notes (Signed)
   Subjective:   Patient ID: Savannah Rogers, female    DOB: Sep 20, 1966, 55 y.o.   MRN: [id number]  HPI The patient is a 55 YO female coming in for concerns about right breast spot with redness and draining some stuff. Started 2-3 weeks getting worse.  Review of Systems  Constitutional: Negative.   HENT: Negative.    Eyes: Negative.   Respiratory:  Negative for cough, chest tightness and shortness of breath.   Cardiovascular:  Negative for chest pain, palpitations and leg swelling.       Right breast problem  Gastrointestinal:  Negative for abdominal distention, abdominal pain, constipation, diarrhea, nausea and vomiting.  Musculoskeletal: Negative.   Skin: Negative.   Neurological: Negative.   Psychiatric/Behavioral: Negative.     Objective:  Physical Exam Constitutional:      Appearance: She is well-developed.  HENT:     Head: Normocephalic and atraumatic.  Cardiovascular:     Rate and Rhythm: Normal rate and regular rhythm.     Comments: Right breast with abscess freely draining and with hardness about 2 cm circular Pulmonary:     Effort: Pulmonary effort is normal. No respiratory distress.     Breath sounds: Normal breath sounds. No wheezing or rales.  Abdominal:     General: Bowel sounds are normal. There is no distension.     Palpations: Abdomen is soft.     Tenderness: There is no abdominal tenderness. There is no rebound.  Musculoskeletal:     Cervical back: Normal range of motion.  Skin:    General: Skin is warm and dry.  Neurological:     Mental Status: She is alert and oriented to person, place, and time.     Coordination: Coordination normal.    Vitals:   09/26/21 0858  BP: 122/70  Pulse: 96  Resp: 18  SpO2: 98%  Weight: 140 lb (63.5 kg)  Height: 5\' 2"  (1.575 m)    This visit occurred during the SARS-CoV-2 public health emergency.  Safety protocols were in place, including screening questions prior to the visit, additional usage of staff PPE, and  extensive cleaning of exam room while observing appropriate contact time as indicated for disinfecting solutions.   Assessment & Plan:

## 2021-09-26 NOTE — Assessment & Plan Note (Signed)
Freely draining and not amenable to I and D today. Rx keflex 1 week.

## 2021-10-07 NOTE — Progress Notes (Signed)
Subjective:    Patient ID: Savannah Rogers, female    DOB: [birth year], 55 y.o.   MRN: [id number]  This visit occurred during the SARS-CoV-2 public health emergency.  Safety protocols were in place, including screening questions prior to the visit, additional usage of staff PPE, and extensive cleaning of exam room while observing appropriate contact time as indicated for disinfecting solutions.    HPI The patient is here for an acute visit.  She is here today with a friend.   Abscess drainage - was seen here 10/27 for right breast sore associated with redness and drainage.  She was prescribed keflex bid x 1 week.  There was not much improvement.  The breast tenderness.  There is still a discharge from the breast - bloody pus.  A couple of days ago she had a good amount of blood, come out.  Her friend wonders if there is anything else that could be causing her symptoms.  No fever/chills.    Medications and allergies reviewed with patient and updated if appropriate.  Patient Active Problem List   Diagnosis Date Noted   Infected sebaceous cyst 10/08/2021   Abscess of right breast 09/26/2021   S/P hysterectomy-complete, 08/2021 09/23/2021   Brittle nails 06/24/2021   Second degree burn of nose, initial encounter 04/16/2020   Fatigue 56/86/1683   Umbilical hernia without obstruction and without gangrene 12/14/2018   Gastroesophageal reflux disease 12/14/2018   Diabetes ([place]) 09/09/2017   Pancreatitis 03/03/2017   Abnormal CXR 04/19/2016   Upper airway cough syndrome likely related to allergic rhinitis  04/10/2016   Essential hypertension, benign 03/05/2016   Non-allergic rhinitis 06/13/2015   Vitamin D deficiency 05/22/2015   Hyperuricemia 03/24/2013   Mixed hyperlipidemia 03/06/2008    Current Outpatient Medications on File Prior to Visit  Medication Sig Dispense Refill   acetaminophen (TYLENOL) 500 MG tablet Take 1 tablet (500 mg total) by mouth every 6 (six) hours as needed  (pain). 30 tablet 0   allopurinol (ZYLOPRIM) 100 MG tablet Take 1 tablet (100 mg total) by mouth daily. 90 tablet 1   atorvastatin (LIPITOR) 20 MG tablet TAKE 1 TABLET BY MOUTH EVERY DAY 90 tablet 1   dapagliflozin propanediol (FARXIGA) 10 MG TABS tablet Take 1 tablet (10 mg total) by mouth daily before breakfast. 90 tablet 1   estradiol (ESTRACE) 0.1 MG/GM vaginal cream Place 0.5g nightly for two weeks then twice a week after 30 g 11   Glucose Blood (BLOOD GLUCOSE TEST STRIPS) STRP One Touch. Test sugars once daily as directed. E11.9 90 strip 3   ibuprofen (ADVIL) 600 MG tablet Take 1 tablet (600 mg total) by mouth every 6 (six) hours as needed. 30 tablet 0   metFORMIN (GLUCOPHAGE-XR) 500 MG 24 hr tablet TAKE 2 TABLETS (1,000 MG TOTAL) BY MOUTH 2 (TWO) TIMES DAILY BEFORE A MEAL. 360 tablet 1   polyethylene glycol powder (GLYCOLAX/MIRALAX) 17 GM/SCOOP powder Take 17 g by mouth daily. Drink 17g (1 scoop) dissolved in water per day. 255 g 0   telmisartan (MICARDIS) 40 MG tablet TAKE 1 TABLET BY MOUTH EVERY DAY 90 tablet 1   VASCEPA 1 g capsule TAKE 2 CAPSULES BY MOUTH 2 TIMES DAILY. 120 capsule 5   No current facility-administered medications on file prior to visit.    Past Medical History:  Diagnosis Date   COVID 10/2020   cough fever x 10 days all symptoms resolved   Hyperlipidemia    Hypertension    type 2  dm    Umbilical hernia 09/73/5329   small no problems with per pt    Past Surgical History:  Procedure Laterality Date   BONE MARROW BIOPSY  2006   for elevated WBC   CHOLECYSTECTOMY  03/16/2012   INTRAOPERATIVE CHOLANGIOGRAM;  Surgeon: Earnstine Regal, MD;  Location: [facility];  Service: General;  Laterality: N/A;   CYSTOSCOPY N/A 08/13/2021   Procedure: Consuela Mimes;  Surgeon: Jaquita Folds, MD;  Location: [facility];  Service: Gynecology;  Laterality: N/A;   G3  P1  A2     laparascopic cholecystectomy  03/2012   Dr Harlow Asa   XI ROBOTIC ASSISTED TOTAL  HYSTERECTOMY WITH SACROCOLPOPEXY N/A 08/13/2021   Procedure: XI ROBOTIC ASSISTED TOTAL HYSTERECTOMY AND BILATERAL SALPINGECTOMY WITH SACROCOLPOPEXY;  Surgeon: Jaquita Folds, MD;  Location: [facility];  Service: Gynecology;  Laterality: N/A;    Social History   Socioeconomic History   Marital status: Single    Spouse name: Not on file   Number of children: 1   Years of education: Not on file   Highest education level: Not on file  Occupational History   Occupation:  HOTEL MANAGER    Employer: [employer]   Occupation: Best boy: [employer]  Tobacco Use   Smoking status: Never   Smokeless tobacco: Never  Vaping Use   Vaping Use: Never used  Substance and Sexual Activity   Alcohol use: No    Alcohol/week: 0.0 standard drinks   Drug use: No   Sexual activity: Yes    Birth control/protection: None  Other Topics Concern   Not on file  Social History Narrative   Not on file   Social Determinants of Health   Financial Resource Strain: Not on file  Food Insecurity: Not on file  Transportation Needs: Not on file  Physical Activity: Not on file  Stress: Not on file  Social Connections: Not on file    Family History  Problem Relation Age of Onset   Stroke Paternal Grandmother 76   Stroke Paternal Grandfather 12   Hypertension Father    Hypertension Mother    Breast cancer Maternal Aunt    Diabetes Neg Hx    Heart disease Neg Hx     Review of Systems     Objective:   Vitals:   10/08/21 0807  BP: 118/70  Pulse: 60  Temp: 97.9 F (36.6 C)  SpO2: 98%   BP Readings from Last 3 Encounters:  10/08/21 118/70  09/26/21 122/70  09/24/21 (!) 153/91   Wt Readings from Last 3 Encounters:  10/08/21 137 lb (62.1 kg)  09/26/21 140 lb (63.5 kg)  09/23/21 137 lb (62.1 kg)   Body mass index is 25.06 kg/m.   Physical Exam Constitutional:      General: She is not in acute distress.    Appearance: Normal appearance. She is not  ill-appearing.  HENT:     Head: Normocephalic and atraumatic.  Chest:     Comments: Right lower breast there is a ping-pong sized area of induration with a central opening with minimal discharge.  After applying pressure there was some pus discharge that was consistent with what would be found in a sebaceous cyst.  Area is tender and erythematous Skin:    General: Skin is warm and dry.  Neurological:     Mental Status: She is alert.           Assessment & Plan:  See Problem List for Assessment and Plan of chronic medical problems.

## 2021-10-08 ENCOUNTER — Encounter: Payer: Self-pay | Admitting: Internal Medicine

## 2021-10-08 ENCOUNTER — Other Ambulatory Visit: Payer: Self-pay

## 2021-10-08 ENCOUNTER — Ambulatory Visit (INDEPENDENT_AMBULATORY_CARE_PROVIDER_SITE_OTHER): Payer: BC Managed Care – PPO | Admitting: Internal Medicine

## 2021-10-08 VITALS — BP 118/70 | HR 60 | Temp 97.9°F | Ht 62.0 in | Wt 137.0 lb

## 2021-10-08 DIAGNOSIS — L723 Sebaceous cyst: Secondary | ICD-10-CM | POA: Diagnosis not present

## 2021-10-08 DIAGNOSIS — L089 Local infection of the skin and subcutaneous tissue, unspecified: Secondary | ICD-10-CM | POA: Insufficient documentation

## 2021-10-08 MED ORDER — SULFAMETHOXAZOLE-TRIMETHOPRIM 800-160 MG PO TABS: 1.0000 | ORAL_TABLET | Freq: Two times a day (BID) | ORAL | 0 refills | Status: AC

## 2021-10-08 MED ORDER — MUPIROCIN 2 % EX OINT: 1.0000 "application " | TOPICAL_OINTMENT | Freq: Two times a day (BID) | CUTANEOUS | 0 refills | Status: DC

## 2021-10-08 NOTE — Assessment & Plan Note (Signed)
Acute Infected sebaceous cyst of lower right breast-was seen last week for abscess, but this does look like an infected sebaceous cyst looking at the contents of the cyst Still with infection-area still indurated Stressed warm compresses-at least a few times a day Start Bactrim DS 800-160 mg twice daily x10 days Bactroban ointment twice daily Call or return if no improvement Due for screening mammogram next month-reassured this is not representative of cancer-this is very typical of an infection

## 2021-10-08 NOTE — Patient Instructions (Addendum)
     Medications changes include :   bactrim twice daily for 10 days and antibacterial ointment twice daily   Use warm compresses as frequently as possible to help the cyst drain  Your prescription(s) have been submitted to your pharmacy. Please take as directed and contact our office if you believe you are having problem(s) with the medication(s).

## 2021-10-16 ENCOUNTER — Ambulatory Visit (INDEPENDENT_AMBULATORY_CARE_PROVIDER_SITE_OTHER): Payer: BC Managed Care – PPO | Admitting: Obstetrics and Gynecology

## 2021-10-16 ENCOUNTER — Other Ambulatory Visit: Payer: Self-pay

## 2021-10-16 ENCOUNTER — Encounter: Payer: Self-pay | Admitting: Obstetrics and Gynecology

## 2021-10-16 VITALS — BP 154/83 | HR 93 | Wt 137.0 lb

## 2021-10-16 DIAGNOSIS — N393 Stress incontinence (female) (male): Secondary | ICD-10-CM

## 2021-10-16 DIAGNOSIS — N611 Abscess of the breast and nipple: Secondary | ICD-10-CM

## 2021-10-16 NOTE — Progress Notes (Signed)
[facility]  Date of Visit: 10/16/2021  History of Present Illness: Ms. Hochberg is a 55 y.o. female scheduled today for a post-operative visit.   Surgery: s/p Robotic assisted total laparoscopic hysterectomy with bilateral salpingectomy, sacrocolpopexy, cystoscopy on 08/13/21  Last visit, tissue was atrophic and sutures were still present. Ordered estrace cream and she has been using it twice per week. Has some urinary leakage with coughing, not present all the time.   Has a breast abscess that has been present on her R breast for 1.5 months. Has seen her PCP and has taken 2 courses of antibiotics (keflex and bactrim). The sore is still present and has some drainage. Previously was very red and draining pus. Reports her last mammogram was last December.   Medications: She has a current medication list which includes the following prescription(s): acetaminophen, allopurinol, atorvastatin, dapagliflozin propanediol, estradiol, blood glucose test strips, ibuprofen, metformin, mupirocin ointment, polyethylene glycol powder, sulfamethoxazole-trimethoprim, telmisartan, and vascepa.   Allergies: Patient has No Known Allergies.   Physical Exam: BP (!) 154/83   Pulse 93   Wt 137 lb (62.1 kg)   LMP  (LMP Unknown) Comment: LMP 2018  BMI 25.06 kg/m    Breast exam: 1cm sore with surrounding induration in the lower outer quadrant of R breast. No active drainage present. No nipple discharge. No further masses or axillary or supraclavicular adenopathy bilaterally.   Pelvic Examination: urethral meatus normal appearance. Vagina: Vaginal cuff is well healed and there is not granulation tissue. Loop of suture also present along the posterior vaginal wall.  No visible or palpable mesh.  POP-Q (09/24/21): POP-Q   -3                                            Aa   -3                                           Ba   -8                                              C    3.5                                             Gh   4                                            Pb   8                                            tvl    -2  Ap   -2                                            Bp                                                  D     ---------------------------------------------------------  Assessment and Plan:  1. Breast abscess   2. SUI (stress urinary incontinence, female)    - Healing well from surgery  Breast abscess - referral placed to [facility] surgery for breast surgeon  SUI - referral placed for pelvic floor physical therapy  Return 6 months for follow up or sooner if needed  [name], MD

## 2021-10-17 ENCOUNTER — Encounter: Payer: BC Managed Care – PPO | Attending: Obstetrics and Gynecology | Admitting: Physical Therapy

## 2021-10-29 ENCOUNTER — Other Ambulatory Visit: Payer: Self-pay | Admitting: Obstetrics and Gynecology

## 2021-10-29 DIAGNOSIS — N611 Abscess of the breast and nipple: Secondary | ICD-10-CM

## 2021-10-30 ENCOUNTER — Other Ambulatory Visit: Payer: Self-pay | Admitting: Obstetrics and Gynecology

## 2021-10-30 DIAGNOSIS — N611 Abscess of the breast and nipple: Secondary | ICD-10-CM

## 2021-11-21 ENCOUNTER — Other Ambulatory Visit: Payer: Self-pay

## 2021-11-21 ENCOUNTER — Ambulatory Visit: Payer: BC Managed Care – PPO

## 2021-11-21 ENCOUNTER — Ambulatory Visit
Admission: RE | Admit: 2021-11-21 | Discharge: 2021-11-21 | Disposition: A | Discharge status: Home / Self Care | Payer: BC Managed Care – PPO | Source: Ambulatory Visit | Attending: Obstetrics and Gynecology | Admitting: Obstetrics and Gynecology

## 2021-11-21 DIAGNOSIS — N611 Abscess of the breast and nipple: Secondary | ICD-10-CM

## 2021-11-21 DIAGNOSIS — N644 Mastodynia: Secondary | ICD-10-CM | POA: Diagnosis not present

## 2021-11-21 DIAGNOSIS — R922 Inconclusive mammogram: Secondary | ICD-10-CM | POA: Diagnosis not present

## 2021-11-26 ENCOUNTER — Ambulatory Visit: Payer: BC Managed Care – PPO | Admitting: Physical Therapy

## 2021-12-03 ENCOUNTER — Encounter: Payer: Self-pay | Admitting: Physical Therapy

## 2021-12-03 ENCOUNTER — Other Ambulatory Visit: Payer: Self-pay

## 2021-12-03 ENCOUNTER — Encounter: Payer: BC Managed Care – PPO | Attending: Internal Medicine | Admitting: Physical Therapy

## 2021-12-03 DIAGNOSIS — N393 Stress incontinence (female) (male): Secondary | ICD-10-CM | POA: Insufficient documentation

## 2021-12-03 DIAGNOSIS — R278 Other lack of coordination: Secondary | ICD-10-CM | POA: Diagnosis not present

## 2021-12-03 DIAGNOSIS — M6281 Muscle weakness (generalized): Secondary | ICD-10-CM | POA: Diagnosis not present

## 2021-12-03 NOTE — Patient Instructions (Signed)
Access Code: [email or web address] URL: [email or web address] Date: 12/03/2021 Prepared by: Eulis Foster  Exercises Supine Pelvic Floor Contract and Release - 3 x daily - 7 x weekly - 1 sets - 5 reps - 10 sec hold Eulis Foster, PT [facility] [address], [address] [place], [place] [address] W: [phone] [email or web address]

## 2021-12-03 NOTE — Therapy (Signed)
[place] at [facility] [address] [place], [place], [address] Phone: [phone]   Fax:  [phone]  Physical Therapy Evaluation  Patient Details  Name: Savannah Rogers MRN: [id number] Date of Birth: Jun 23, 1966 Referring Provider (PT): Dr. Sherlene Shams   Encounter Date: 12/03/2021   PT End of Session - 12/03/21 1542     Visit Number 1    Date for PT Re-Evaluation 02/25/22    Authorization Type BCBS    PT Start Time 1500    PT Stop Time 0459    PT Time Calculation (min) 38 min    Activity Tolerance Patient tolerated treatment well    Behavior During Therapy [facility] for tasks assessed/performed             Past Medical History:  Diagnosis Date   COVID 10/2020   cough fever x 10 days all symptoms resolved   Hyperlipidemia    Hypertension    type 2 dm    Umbilical hernia 97/74/1423   small no problems with per pt    Past Surgical History:  Procedure Laterality Date   BONE MARROW BIOPSY  2006   for elevated WBC   CHOLECYSTECTOMY  03/16/2012   INTRAOPERATIVE CHOLANGIOGRAM;  Surgeon: Earnstine Regal, MD;  Location: [facility];  Service: General;  Laterality: N/A;   CYSTOSCOPY N/A 08/13/2021   Procedure: Consuela Mimes;  Surgeon: Jaquita Folds, MD;  Location: [facility];  Service: Gynecology;  Laterality: N/A;   G3  P1  A2     laparascopic cholecystectomy  03/2012   Dr Harlow Asa   XI ROBOTIC ASSISTED TOTAL HYSTERECTOMY WITH SACROCOLPOPEXY N/A 08/13/2021   Procedure: XI ROBOTIC ASSISTED TOTAL HYSTERECTOMY AND BILATERAL SALPINGECTOMY WITH SACROCOLPOPEXY;  Surgeon: Jaquita Folds, MD;  Location: [facility];  Service: Gynecology;  Laterality: N/A;    There were no vitals filed for this visit.    Subjective Assessment - 12/03/21 1506     Subjective Patient started to have urinary leakage after her suegery. Will leak with cough and sneeze.    Patient Stated Goals reduce leakage     Currently in Pain? No/denies    Multiple Pain Sites No                [facility] PT Assessment - 12/03/21 0001       Assessment   Medical Diagnosis N39.3 SUI    Referring Provider (PT) Dr. Sherlene Shams    Onset Date/Surgical Date 08/13/21    Prior Therapy none      Precautions   Precautions None      Restrictions   Weight Bearing Restrictions No      Balance Screen   Has the patient fallen in the past 6 months No    Has the patient had a decrease in activity level because of a fear of falling?  No    Is the patient reluctant to leave their home because of a fear of falling?  No      Home Ecologist residence      Prior Function   Level of Independence Independent    Associate Professor with stand and sit    Leisure cycling and other exercise prior to surgery      Cognition   Overall Cognitive Status Within Functional Limits for tasks assessed      ROM / Strength   AROM / PROM / Strength AROM;PROM;Strength  AROM   Overall AROM Comments lumbar ROM is full      Strength   Right Hip Flexion 4/5    Right Hip Extension 3+/5    Right Hip ABduction 4/5    Left Hip Extension 3+/5    Left Hip ABduction 4/5      Palpation   Palpation comment umbilical hernia; rib angle is larger than 90 degrees;                        Objective measurements completed on examination: See above findings.     Pelvic Floor Special Questions - 12/03/21 0001     Diastasis Recti 1 finger width    Urinary Leakage Yes    Activities that cause leaking Coughing;Sneezing    Urinary urgency No    Falling out feeling (prolapse) No    Skin Integrity Intact;Other    Skin Integrity other dryness on thelabia majora    Pelvic Floor Internal Exam Patient confirms identification and approves PT to assess pelvic floor and treatment    Exam Type Vaginal    Palpation difficult for relaxation of the pelvic floor    Strength fair  squeeze, definite lift   took several trials, would bear down at first and inital strength was 2/5.             [facility] Adult PT Treatment/Exercise - 12/03/21 0001       Self-Care   Self-Care Other Self-Care Comments    Other Self-Care Comments  discussed with patient on using cocnut oil to improve tissue dryness and tissue health      Lumbar Exercises: Supine   Other Supine Lumbar Exercises supine pelvic floor contraction holding for 10 seconds rest for 10 seconds 10x                     PT Education - 12/03/21 1541     Education Details Access Code: [email or web address]    Person(s) Educated Patient    Methods Explanation;Demonstration;Verbal cues;Handout    Comprehension Returned demonstration;Verbalized understanding              PT Short Term Goals - 12/03/21 1552       PT SHORT TERM GOAL #1   Title Patient is able to contract the pelvic floor and fully relax  quickly due to improve diaphragmatic breathing    Time 4    Period Weeks    Status New    Target Date 12/31/21      PT SHORT TERM GOAL #2   Title Patient is able to contract the abdominals without bulging the umbilical hernia due to improve muscle coordination and strength    Time 4    Period Weeks    Status New    Target Date 12/31/21               PT Long Term Goals - 12/03/21 1554       PT LONG TERM GOAL #1   Title Patient is independent with pelvic floor and core strength to reduce urinary leakage    Time 12    Period Weeks    Status New    Target Date 02/25/22      PT LONG TERM GOAL #2   Title Patient is able to cough and sneeze without urinary leakage due to increased strength and pelvic floor coordination    Time 12    Period Weeks    Status New  Target Date 02/25/22      PT LONG TERM GOAL #3   Title Pelvic floor strength is >/= 3/5 with quick contraction and quick release and not bulging her abdomen    Time 12    Period Weeks    Status New    Target Date 02/25/22                     Plan - 12/03/21 1541     Clinical Impression Statement Patient is a 56 year old female with stress incontinence. She reports her symptoms began after she had a Total hysterectomy on 08/13/21. She will leak urine with coughing and snnezing. Patient has weakness in the hip abductors. She has an umbilical hernia and 1 finger width diastasis recti. Patient initial pelvic floor contraction was 1/5 due to her trying to breath out and bear down. After she had tactile and verbal cues she was able to contract the pelvic floor with a lift and strength at 3/5.She has some trouble with relaxation of the pelvic floor. Patient rib angle is greaer than 90 degrees. Patient would benefit from skilled therapy to improve pelvic floor coordination and strength to reduce her leakage.    Personal Factors and Comorbidities Comorbidity 2    Comorbidities Umbilical hernia; robotic total hysterectomy with sacrocolpopexy 08/13/2021    Examination-Activity Limitations Continence;Other   cough, sneeze   Stability/Clinical Decision Making Stable/Uncomplicated    Clinical Decision Making Low    Rehab Potential Excellent    PT Frequency 1x / week    PT Duration 12 weeks    PT Treatment/Interventions ADLs/Self Care Home Management;Biofeedback;Neuromuscular re-education;Therapeutic exercise;Therapeutic activities;Patient/family education;Manual techniques    PT Next Visit Plan Woek on rib mobitliy due to increased rib angle; abdominal contraction; pelvic floor contraction quickly    PT Home Exercise Plan Access Code: [email or web address]    Consulted and Agree with Plan of Care Patient             Patient will benefit from skilled therapeutic intervention in order to improve the following deficits and impairments:  Decreased activity tolerance, Decreased strength, Decreased coordination  Visit Diagnosis: Muscle weakness (generalized) - Plan: PT plan of care cert/re-cert  Other lack of coordination - Plan: PT plan  of care cert/re-cert     Problem List Patient Active Problem List   Diagnosis Date Noted   Infected sebaceous cyst 10/08/2021   Abscess of right breast 09/26/2021   S/P hysterectomy-complete, 08/2021 09/23/2021   Brittle nails 06/24/2021   Second degree burn of nose, initial encounter 04/16/2020   Fatigue 33/38/3291   Umbilical hernia without obstruction and without gangrene 12/14/2018   Gastroesophageal reflux disease 12/14/2018   Diabetes ([place]) 09/09/2017   Pancreatitis 03/03/2017   Abnormal CXR 04/19/2016   Upper airway cough syndrome likely related to allergic rhinitis  04/10/2016   Essential hypertension, benign 03/05/2016   Non-allergic rhinitis 06/13/2015   Vitamin D deficiency 05/22/2015   Hyperuricemia 03/24/2013   Mixed hyperlipidemia 03/06/2008    Earlie Counts, PT 12/03/21 3:59 PM  [place] at [facility] [address], [place], [place], [address] Phone: [phone]   Fax:  [phone]  Name: Savannah Rogers MRN: [id number] Date of Birth: 04/25/1966

## 2021-12-10 ENCOUNTER — Other Ambulatory Visit: Payer: Self-pay

## 2021-12-10 ENCOUNTER — Encounter: Payer: BC Managed Care – PPO | Admitting: Physical Therapy

## 2021-12-10 ENCOUNTER — Encounter: Payer: Self-pay | Admitting: Physical Therapy

## 2021-12-10 DIAGNOSIS — R278 Other lack of coordination: Secondary | ICD-10-CM | POA: Diagnosis not present

## 2021-12-10 DIAGNOSIS — N393 Stress incontinence (female) (male): Secondary | ICD-10-CM | POA: Diagnosis not present

## 2021-12-10 DIAGNOSIS — M6281 Muscle weakness (generalized): Secondary | ICD-10-CM

## 2021-12-10 NOTE — Patient Instructions (Signed)
Access Code: [email or web address] URL: [email or web address] Date: 12/10/2021 Prepared by: Eulis Foster  Exercises Supine Pelvic Floor Contract and Release - 3 x daily - 7 x weekly - 1 sets - 5 reps - 10 sec hold Hooklying Isometric Hip Flexion - 1 x daily - 3 x weekly - 1 sets - 5 reps Supine Bridge with Mini Swiss Ball Between Knees - 1 x daily - 3 x weekly - 1 sets - 10 reps Bear Plank from [employer] - 1 x daily - 3 x weekly - 1 sets - 10 reps - 3 sec hold Anti-Rotation Lateral Stepping with Press - 1 x daily - 3 x weekly - 1 sets - 10 reps Deadlift with Resistance - 1 x daily - 3 x weekly - 1 sets - 10 reps Eulis Foster, PT [facility] [address], [address] [place], [place] [address] W: [phone] [name]

## 2021-12-10 NOTE — Therapy (Signed)
[place] at [facility] [address] [place], [place], [address] Phone: [phone]   Fax:  [phone]  Physical Therapy Treatment  Patient Details  Name: Savannah Rogers MRN: [id number] Date of Birth: [birth year] Referring Provider (PT): Dr. Sherlene Shams   Encounter Date: 12/10/2021   PT End of Session - 12/10/21 1550     Visit Number 2    Date for PT Re-Evaluation 02/25/22    Authorization Type BCBS    Authorization - Visit Number 2    Authorization - Number of Visits 30    PT Start Time 1500    PT Stop Time 1545    PT Time Calculation (min) 45 min    Activity Tolerance Patient tolerated treatment well    Behavior During Therapy [facility] for tasks assessed/performed             Past Medical History:  Diagnosis Date   COVID 10/2020   cough fever x 10 days all symptoms resolved   Hyperlipidemia    Hypertension    type 2 dm    Umbilical hernia 31/54/0086   small no problems with per pt    Past Surgical History:  Procedure Laterality Date   BONE MARROW BIOPSY  2006   for elevated WBC   CHOLECYSTECTOMY  03/16/2012   INTRAOPERATIVE CHOLANGIOGRAM;  Surgeon: Earnstine Regal, MD;  Location: [facility];  Service: General;  Laterality: N/A;   CYSTOSCOPY N/A 08/13/2021   Procedure: Consuela Mimes;  Surgeon: Jaquita Folds, MD;  Location: [facility];  Service: Gynecology;  Laterality: N/A;   G3  P1  A2     laparascopic cholecystectomy  03/2012   Dr Harlow Asa   XI ROBOTIC ASSISTED TOTAL HYSTERECTOMY WITH SACROCOLPOPEXY N/A 08/13/2021   Procedure: XI ROBOTIC ASSISTED TOTAL HYSTERECTOMY AND BILATERAL SALPINGECTOMY WITH SACROCOLPOPEXY;  Surgeon: Jaquita Folds, MD;  Location: [facility];  Service: Gynecology;  Laterality: N/A;    There were no vitals filed for this visit.   Subjective Assessment - 12/10/21 1503     Subjective I felt better after from last visit. I have not been leaking urine  since last visit.    Patient Stated Goals reduce leakage    Currently in Pain? No/denies    Multiple Pain Sites No                               [facility] Adult PT Treatment/Exercise - 12/10/21 0001       Neuro Re-ed    Neuro Re-ed Details  supine with 360 breath and therapist assisting the lower rib cage downward and engagement of the abdominals      Lumbar Exercises: Standing   Other Standing Lumbar Exercises dead lift with red band and pelvic floor contraction keeping spinal neutral and squapula retracted 10x    Other Standing Lumbar Exercises standing walk to side and then do Pallof with red band and using correct breath 10x each way      Lumbar Exercises: Supine   Bridge with [employer] 10 reps    Bridge with [employer] Limitations with pelvic floor contraction    Isometric Hip Flexion 10 reps;3 seconds   right , left   Isometric Hip Flexion Limitations as patient breathes out she is to resist knee flexion and contract the abdominals      Lumbar Exercises: Quadruped   Other Quadruped Lumbar Exercises quadruped with abdominal contraction  and lift knees off mat, contractng the pelvic floor and keeping spinal neutral called ther bear plank in quadruped                     PT Education - 12/10/21 1541     Education Details Access Code: [email or web address]    Person(s) Educated Patient    Methods Explanation;Demonstration;Verbal cues;Handout    Comprehension Returned demonstration;Verbalized understanding              PT Short Term Goals - 12/03/21 1552       PT SHORT TERM GOAL #1   Title Patient is able to contract the pelvic floor and fully relax  quickly due to improve diaphragmatic breathing    Time 4    Period Weeks    Status New    Target Date 12/31/21      PT SHORT TERM GOAL #2   Title Patient is able to contract the abdominals without bulging the umbilical hernia due to improve muscle coordination and strength    Time 4    Period Weeks     Status New    Target Date 12/31/21               PT Long Term Goals - 12/03/21 1554       PT LONG TERM GOAL #1   Title Patient is independent with pelvic floor and core strength to reduce urinary leakage    Time 12    Period Weeks    Status New    Target Date 02/25/22      PT LONG TERM GOAL #2   Title Patient is able to cough and sneeze without urinary leakage due to increased strength and pelvic floor coordination    Time 12    Period Weeks    Status New    Target Date 02/25/22      PT LONG TERM GOAL #3   Title Pelvic floor strength is >/= 3/5 with quick contraction and quick release and not bulging her abdomen    Time 12    Period Weeks    Status New    Target Date 02/25/22                   Plan - 12/10/21 1542     Clinical Impression Statement Patient reports no urinary leakage since the initial evaluation. She has been doing her pelvic floor contractions. Patient was able to engage her abdominals correctly once she was able to work with her breath and contract thel ower abdominals correctly. Patient has learned core exercises in supine, quadruped and standing. Patient will benefit from skilled therapy to improve pelvic floor coordination and strength to reduce her leakage.    Personal Factors and Comorbidities Comorbidity 2    Comorbidities Umbilical hernia; robotic total hysterectomy with sacrocolpopexy 08/13/2021    Examination-Activity Limitations Continence;Other   cough, sneeze   Stability/Clinical Decision Making Stable/Uncomplicated    Rehab Potential Excellent    PT Frequency 1x / week    PT Duration 12 weeks    PT Treatment/Interventions ADLs/Self Care Home Management;Biofeedback;Neuromuscular re-education;Therapeutic exercise;Therapeutic activities;Patient/family education;Manual techniques    PT Next Visit Plan work on quick contractions and engaging the abdominals correctly; if no urinary leakage then can be discharged    PT Home Exercise  Plan Access Code: [email or web address]    Recommended Other Services MD signed initial eval    Consulted and Agree with Plan of Care Patient  Patient will benefit from skilled therapeutic intervention in order to improve the following deficits and impairments:  Decreased activity tolerance, Decreased strength, Decreased coordination  Visit Diagnosis: Muscle weakness (generalized)  Other lack of coordination     Problem List Patient Active Problem List   Diagnosis Date Noted   Infected sebaceous cyst 10/08/2021   Abscess of right breast 09/26/2021   S/P hysterectomy-complete, 08/2021 09/23/2021   Brittle nails 06/24/2021   Second degree burn of nose, initial encounter 04/16/2020   Fatigue 47/08/6282   Umbilical hernia without obstruction and without gangrene 12/14/2018   Gastroesophageal reflux disease 12/14/2018   Diabetes ([place]) 09/09/2017   Pancreatitis 03/03/2017   Abnormal CXR 04/19/2016   Upper airway cough syndrome likely related to allergic rhinitis  04/10/2016   Essential hypertension, benign 03/05/2016   Non-allergic rhinitis 06/13/2015   Vitamin D deficiency 05/22/2015   Hyperuricemia 03/24/2013   Mixed hyperlipidemia 03/06/2008    Earlie Counts, PT 12/10/21 3:51 PM   [place] at [facility] [address], [place], [place], [address] Phone: [phone]   Fax:  [phone]  Name: Savannah Rogers MRN: [id number] Date of Birth: 09/08/1966

## 2021-12-14 ENCOUNTER — Other Ambulatory Visit: Payer: Self-pay | Admitting: Internal Medicine

## 2021-12-18 ENCOUNTER — Other Ambulatory Visit: Payer: BC Managed Care – PPO

## 2021-12-25 ENCOUNTER — Ambulatory Visit: Payer: BC Managed Care – PPO | Admitting: Internal Medicine

## 2021-12-29 ENCOUNTER — Other Ambulatory Visit: Payer: Self-pay | Admitting: Internal Medicine

## 2021-12-31 ENCOUNTER — Other Ambulatory Visit: Payer: Self-pay | Admitting: Internal Medicine

## 2022-01-16 ENCOUNTER — Encounter: Payer: Self-pay | Admitting: Physical Therapy

## 2022-01-16 ENCOUNTER — Other Ambulatory Visit: Payer: Self-pay

## 2022-01-16 ENCOUNTER — Encounter: Payer: Self-pay | Attending: Internal Medicine | Admitting: Physical Therapy

## 2022-01-16 DIAGNOSIS — R278 Other lack of coordination: Secondary | ICD-10-CM | POA: Insufficient documentation

## 2022-01-16 DIAGNOSIS — N393 Stress incontinence (female) (male): Secondary | ICD-10-CM | POA: Insufficient documentation

## 2022-01-16 DIAGNOSIS — M6281 Muscle weakness (generalized): Secondary | ICD-10-CM | POA: Insufficient documentation

## 2022-01-16 NOTE — Therapy (Signed)
[place] at [facility] [address] [place], [place], [address] Phone: [phone]   Fax:  [phone]  Physical Therapy Treatment  Patient Details  Name: Savannah Rogers MRN: [id number] Date of Birth: 1966-01-13 Referring Provider (PT): Dr. Sherlene Shams   Encounter Date: 01/16/2022   PT End of Session - 01/16/22 1645     Visit Number 3    Date for PT Re-Evaluation 02/25/22    Authorization Type BCBS    Authorization - Visit Number 3    Authorization - Number of Visits 30    PT Start Time 1600    PT Stop Time 1640    PT Time Calculation (min) 40 min    Activity Tolerance Patient tolerated treatment well    Behavior During Therapy [facility] for tasks assessed/performed             Past Medical History:  Diagnosis Date   COVID 10/2020   cough fever x 10 days all symptoms resolved   Hyperlipidemia    Hypertension    type 2 dm    Umbilical hernia 67/67/2094   small no problems with per pt    Past Surgical History:  Procedure Laterality Date   BONE MARROW BIOPSY  2006   for elevated WBC   CHOLECYSTECTOMY  03/16/2012   INTRAOPERATIVE CHOLANGIOGRAM;  Surgeon: Earnstine Regal, MD;  Location: [facility];  Service: General;  Laterality: N/A;   CYSTOSCOPY N/A 08/13/2021   Procedure: Consuela Mimes;  Surgeon: Jaquita Folds, MD;  Location: [facility];  Service: Gynecology;  Laterality: N/A;   G3  P1  A2     laparascopic cholecystectomy  03/2012   Dr Harlow Asa   XI ROBOTIC ASSISTED TOTAL HYSTERECTOMY WITH SACROCOLPOPEXY N/A 08/13/2021   Procedure: XI ROBOTIC ASSISTED TOTAL HYSTERECTOMY AND BILATERAL SALPINGECTOMY WITH SACROCOLPOPEXY;  Surgeon: Jaquita Folds, MD;  Location: [facility];  Service: Gynecology;  Laterality: N/A;    There were no vitals filed for this visit.   Subjective Assessment - 01/16/22 1558     Subjective When I cough or sneeze i will leak. I have been able to do my  exercises.    Patient Stated Goals reduce leakage    Currently in Pain? No/denies    Multiple Pain Sites No                               [facility] Adult PT Treatment/Exercise - 01/16/22 0001       Lumbar Exercises: Seated   Other Seated Lumbar Exercises seated pelvic floor contraction holding for 5 seconds 70J then 10 quick flicks      Lumbar Exercises: Supine   Ab Set 5 reps;5 seconds    AB Set Limitations but has trouble with abdominal contraction    Bent Knee Raise 20 reps;1 second    Bent Knee Raise Limitations with red band around knees and abdominal contraction    Bridge with clamshell 15 reps;1 second   with red band around the knees   Isometric Hip Flexion 10 reps;3 seconds   right , left   Isometric Hip Flexion Limitations as patient breathes out she is to resist knee flexion and contract the abdominals    Other Supine Lumbar Exercises both legs up and press on one knee while the other goes up and down 15x each leg      Lumbar Exercises: Sidelying   Other Sidelying Lumbar  Exercises sidley press ball into mat and contract the pelvic floor and abdominals holding for 5 seconds 10x each side                     PT Education - 01/16/22 1644     Education Details Access Code: [email or web address]    Person(s) Educated Patient    Methods Explanation;Demonstration;Verbal cues;Handout    Comprehension Returned demonstration;Verbalized understanding              PT Short Term Goals - 01/16/22 1601       PT SHORT TERM GOAL #1   Title Patient is able to contract the pelvic floor and fully relax  quickly due to improve diaphragmatic breathing    Time 4    Period Weeks    Status Achieved      PT SHORT TERM GOAL #2   Title Patient is able to contract the abdominals without bulging the umbilical hernia due to improve muscle coordination and strength    Time 4    Period Weeks    Status Achieved               PT Long Term Goals - 12/03/21 1554        PT LONG TERM GOAL #1   Title Patient is independent with pelvic floor and core strength to reduce urinary leakage    Time 12    Period Weeks    Status New    Target Date 02/25/22      PT LONG TERM GOAL #2   Title Patient is able to cough and sneeze without urinary leakage due to increased strength and pelvic floor coordination    Time 12    Period Weeks    Status New    Target Date 02/25/22      PT LONG TERM GOAL #3   Title Pelvic floor strength is >/= 3/5 with quick contraction and quick release and not bulging her abdomen    Time 12    Period Weeks    Status New    Target Date 02/25/22                   Plan - 01/16/22 1645     Clinical Impression Statement Patient is only leaking urine with sneezing, and coughing. She will bulge her abdomen when she is sneezing and coughing. Therapist working on abdominal contraction. She has difficulty with abdominal contraction but does better if there is resistance. Patient understands what it means to contract the pelvic floor. Patient will benefit from skilled therapy to to improve pelvic floor coordination and strength to reduce her leakage.    Personal Factors and Comorbidities Comorbidity 2    Comorbidities Umbilical hernia; robotic total hysterectomy with sacrocolpopexy 08/13/2021    Examination-Activity Limitations Continence;Other   cough, sneeze   Stability/Clinical Decision Making Stable/Uncomplicated    Rehab Potential Excellent    PT Frequency 1x / week    PT Duration 12 weeks    PT Treatment/Interventions ADLs/Self Care Home Management;Biofeedback;Neuromuscular re-education;Therapeutic exercise;Therapeutic activities;Patient/family education;Manual techniques    PT Next Visit Plan work on quick contractions and engaging the abdominals correctly; work on abdominal contractions    PT Home Exercise Plan Access Code: [email or web address]    Consulted and Agree with Plan of Care Patient             Patient will benefit  from skilled therapeutic intervention in order to improve the following deficits and impairments:  Decreased activity  tolerance, Decreased strength, Decreased coordination  Visit Diagnosis: Muscle weakness (generalized)  Other lack of coordination     Problem List Patient Active Problem List   Diagnosis Date Noted   Infected sebaceous cyst 10/08/2021   Abscess of right breast 09/26/2021   S/P hysterectomy-complete, 08/2021 09/23/2021   Brittle nails 06/24/2021   Second degree burn of nose, initial encounter 04/16/2020   Fatigue 56/25/6389   Umbilical hernia without obstruction and without gangrene 12/14/2018   Gastroesophageal reflux disease 12/14/2018   Diabetes ([place]) 09/09/2017   Pancreatitis 03/03/2017   Abnormal CXR 04/19/2016   Upper airway cough syndrome likely related to allergic rhinitis  04/10/2016   Essential hypertension, benign 03/05/2016   Non-allergic rhinitis 06/13/2015   Vitamin D deficiency 05/22/2015   Hyperuricemia 03/24/2013   Mixed hyperlipidemia 03/06/2008    Earlie Counts, PT 01/16/22 4:51 PM  [facility] [address] [place], [place], [address] Phone: [phone]   Fax:  [phone]  Name: Luanna Weesner MRN: [id number] Date of Birth: 1966/07/15

## 2022-01-16 NOTE — Patient Instructions (Signed)
Access Code: [email or web address] URL: [email or web address] Date: 01/16/2022 Prepared by: Eulis Foster  Exercises Supine Bridge with [employer] Between Knees - 1 x daily - 3 x weekly - 1 sets - 10 reps Bear Plank from Quadruped - 1 x daily - 3 x weekly - 1 sets - 10 reps - 3 sec hold Anti-Rotation Lateral Stepping with Press - 1 x daily - 3 x weekly - 1 sets - 10 reps Deadlift with Resistance - 1 x daily - 3 x weekly - 1 sets - 10 reps Supine March with Resistance Band - 1 x daily - 3 x weekly - 2 sets - 10 reps Hooklying Isometric Hip Flexion - 1 x daily - 7 x weekly - 3 sets - 10 reps Sidelying Transversus Abdominis Bracing - 1 x daily - 3 x weekly - 1 sets - 10 reps - 5 sec hold Seated Quick Flick Pelvic Floor Contractions - 3 x daily - 7 x weekly - 1 sets - 5 reps Seated Pelvic Floor Contraction - 3 x daily - 7 x weekly - 1 sets - 10 reps - 5 sec hold  Eulis Foster, PT [facility] [address], [address] [place], [place] [address] W: [phone] [email or web address]

## 2022-01-27 ENCOUNTER — Telehealth: Payer: Self-pay | Admitting: Nurse Practitioner

## 2022-01-27 DIAGNOSIS — R112 Nausea with vomiting, unspecified: Secondary | ICD-10-CM

## 2022-01-27 DIAGNOSIS — J0140 Acute pansinusitis, unspecified: Secondary | ICD-10-CM

## 2022-01-27 DIAGNOSIS — R051 Acute cough: Secondary | ICD-10-CM

## 2022-01-27 MED ORDER — ONDANSETRON HCL 4 MG PO TABS: 4.0000 mg | ORAL_TABLET | Freq: Three times a day (TID) | ORAL | 0 refills | Status: DC | PRN

## 2022-01-27 MED ORDER — AMOXICILLIN-POT CLAVULANATE 875-125 MG PO TABS: 1.0000 | ORAL_TABLET | Freq: Two times a day (BID) | ORAL | 0 refills | Status: AC

## 2022-01-27 MED ORDER — BENZONATATE 100 MG PO CAPS: 100.0000 mg | ORAL_CAPSULE | Freq: Three times a day (TID) | ORAL | 0 refills | Status: DC | PRN

## 2022-01-27 NOTE — Progress Notes (Signed)
Virtual Visit Consent   Savannah Rogers, you are scheduled for a virtual visit with a [facility] provider today.     Just as with appointments in the office, your consent must be obtained to participate.  Your consent will be active for this visit and any virtual visit you may have with one of our providers in the next 365 days.     If you have a MyChart account, a copy of this consent can be sent to you electronically.  All virtual visits are billed to your insurance company just like a traditional visit in the office.    As this is a virtual visit, video technology does not allow for your provider to perform a traditional examination.  This may limit your provider's ability to fully assess your condition.  If your provider identifies any concerns that need to be evaluated in person or the need to arrange testing (such as labs, EKG, etc.), we will make arrangements to do so.     Although advances in technology are sophisticated, we cannot ensure that it will always work on either your end or our end.  If the connection with a video visit is poor, the visit may have to be switched to a telephone visit.  With either a video or telephone visit, we are not always able to ensure that we have a secure connection.     I need to obtain your verbal consent now.   Are you willing to proceed with your visit today?    Savannah Rogers has provided verbal consent on 01/27/2022 for a virtual visit (video or telephone).   Savannah Schneiders, FNP   Date: 01/27/2022 4:15 PM   Virtual Visit via Video Note   I, Savannah Rogers, connected with  Savannah Rogers  ([id number], 05/30/1966) on 01/27/22 at  4:30 PM EST by a video-enabled telemedicine application and verified that I am speaking with the correct person using two identifiers.  Location: Patient: Virtual Visit Location Patient: Home Provider: Virtual Visit Location Provider: Home Office   I discussed the limitations of evaluation and management by telemedicine and  the availability of in person appointments. The patient expressed understanding and agreed to proceed.    History of Present Illness: Savannah Rogers is a 56 y.o. who identifies as a female who was assigned female at birth, and is being seen today for sore throat and swollen glands for the past week. She is having pressure on her nose and eyes.   Her eyes are swollen in the morning.   She does have small children at home that are not sick.  She has taken a COVID test that is negative.  Denies a fever She has pain when she swallows.   She has nasal congestion and a productive cough as well.   Denies a history of asthma   She has been using Dayquil for relief so far   She is having problems vomiting after she eats due to coughing as well   Problems:  Patient Active Problem List   Diagnosis Date Noted   Infected sebaceous cyst 10/08/2021   Abscess of right breast 09/26/2021   S/P hysterectomy-complete, 08/2021 09/23/2021   Brittle nails 06/24/2021   Second degree burn of nose, initial encounter 04/16/2020   Fatigue [id number]   Umbilical hernia without obstruction and without gangrene 12/14/2018   Gastroesophageal reflux disease 12/14/2018   Diabetes ([place]) 09/09/2017   Pancreatitis 03/03/2017   Abnormal CXR 04/19/2016   Upper airway cough syndrome likely related  to allergic rhinitis  04/10/2016   Essential hypertension, benign 03/05/2016   Non-allergic rhinitis 06/13/2015   Vitamin D deficiency 05/22/2015   Hyperuricemia 03/24/2013   Mixed hyperlipidemia 03/06/2008    Allergies: No Known Allergies Medications:  Current Outpatient Medications:    acetaminophen (TYLENOL) 500 MG tablet, Take 1 tablet (500 mg total) by mouth every 6 (six) hours as needed (pain)., Disp: 30 tablet, Rfl: 0   allopurinol (ZYLOPRIM) 100 MG tablet, TAKE 1 TABLET BY MOUTH EVERY DAY, Disp: 90 tablet, Rfl: 1   atorvastatin (LIPITOR) 20 MG tablet, TAKE 1 TABLET BY MOUTH EVERY DAY, Disp: 90 tablet, Rfl: 1    estradiol (ESTRACE) 0.1 MG/GM vaginal cream, Place 0.5g nightly for two weeks then twice a week after, Disp: 30 g, Rfl: 11   FARXIGA 10 MG TABS tablet, TAKE 1 TABLET BY MOUTH DAILY BEFORE BREAKFAST., Disp: 90 tablet, Rfl: 1   ibuprofen (ADVIL) 600 MG tablet, Take 1 tablet (600 mg total) by mouth every 6 (six) hours as needed., Disp: 30 tablet, Rfl: 0   metFORMIN (GLUCOPHAGE-XR) 500 MG 24 hr tablet, TAKE 2 TABLETS (1,000 MG TOTAL) BY MOUTH 2 (TWO) TIMES DAILY BEFORE A MEAL., Disp: 360 tablet, Rfl: 1   mupirocin ointment (BACTROBAN) 2 %, Apply 1 application topically 2 (two) times daily., Disp: 22 g, Rfl: 0   ONETOUCH ULTRA test strip, ONE TOUCH. TEST SUGARS ONCE DAILY AS DIRECTED. E11.9, Disp: 100 strip, Rfl: 3   polyethylene glycol powder (GLYCOLAX/MIRALAX) 17 GM/SCOOP powder, Take 17 g by mouth daily. Drink 17g (1 scoop) dissolved in water per day., Disp: 255 g, Rfl: 0   telmisartan (MICARDIS) 40 MG tablet, TAKE 1 TABLET BY MOUTH EVERY DAY, Disp: 90 tablet, Rfl: 1   VASCEPA 1 g capsule, TAKE 2 CAPSULES BY MOUTH 2 TIMES DAILY., Disp: 120 capsule, Rfl: 5  Observations/Objective: Patient is well-developed, well-nourished in no acute distress.  Resting comfortably at home.  Head is normocephalic, atraumatic.  No labored breathing.  Speech is clear and coherent with logical content.  Patient is alert and oriented at baseline.    Assessment and Plan: 1. Acute non-recurrent pansinusitis  - amoxicillin-clavulanate (AUGMENTIN) 875-125 MG tablet; Take 1 tablet by mouth 2 (two) times daily for 7 days. Take with food  Dispense: 14 tablet; Refill: 0  2. Nausea and vomiting, unspecified vomiting type 3. Acute cough Meds ordered this encounter  Medications   amoxicillin-clavulanate (AUGMENTIN) 875-125 MG tablet    Sig: Take 1 tablet by mouth 2 (two) times daily for 7 days. Take with food    Dispense:  14 tablet    Refill:  0   benzonatate (TESSALON) 100 MG capsule    Sig: Take 1 capsule (100 mg  total) by mouth 3 (three) times daily as needed for cough.    Dispense:  30 capsule    Refill:  0   ondansetron (ZOFRAN) 4 MG tablet    Sig: Take 1 tablet (4 mg total) by mouth every 8 (eight) hours as needed for nausea or vomiting.    Dispense:  20 tablet    Refill:  0           Follow Up Instructions: I discussed the assessment and treatment plan with the patient. The patient was provided an opportunity to ask questions and all were answered. The patient agreed with the plan and demonstrated an understanding of the instructions.  A copy of instructions were sent to the patient via MyChart unless otherwise noted below.  The patient was advised to call back or seek an in-person evaluation if the symptoms worsen or if the condition fails to improve as anticipated.  Time:  I spent 10 minutes with the patient via telehealth technology discussing the above problems/concerns.    Savannah Schneiders, FNP

## 2022-01-28 ENCOUNTER — Encounter: Payer: Self-pay | Admitting: Physical Therapy

## 2022-01-28 ENCOUNTER — Telehealth: Payer: Self-pay | Admitting: Physical Therapy

## 2022-01-28 NOTE — Telephone Encounter (Signed)
Called patient about her missed appointment today. She reported she called to cancel due to her being sick. She will come to the appointment on 02/11/2022.  Earlie Counts, PT @2 /28/2023@ 4:54 PM

## 2022-01-29 NOTE — Progress Notes (Signed)
? ? ?  Subjective:  ? ? Patient ID: Savannah Rogers, female    DOB: Jul 24, 1966, 56 y.o.   MRN: [id number] ? ?This visit occurred during the SARS-CoV-2 public health emergency.  Safety protocols were in place, including screening questions prior to the visit, additional usage of staff PPE, and extensive cleaning of exam room while observing appropriate contact time as indicated for disinfecting solutions. ? ? ? ?HPI ?Savannah Rogers is here for  ?Chief Complaint  ?Patient presents with  ? Sore Throat  ? Otalgia  ? ? ? ?Symptoms started 2/24.   ? ? ? ?Medications and allergies reviewed with patient and updated if appropriate. ? ?Current Outpatient Medications on File Prior to Visit  ?Medication Sig Dispense Refill  ? acetaminophen (TYLENOL) 500 MG tablet Take 1 tablet (500 mg total) by mouth every 6 (six) hours as needed (pain). 30 tablet 0  ? allopurinol (ZYLOPRIM) 100 MG tablet TAKE 1 TABLET BY MOUTH EVERY DAY 90 tablet 1  ? amoxicillin-clavulanate (AUGMENTIN) 875-125 MG tablet Take 1 tablet by mouth 2 (two) times daily for 7 days. Take with food 14 tablet 0  ? atorvastatin (LIPITOR) 20 MG tablet TAKE 1 TABLET BY MOUTH EVERY DAY 90 tablet 1  ? benzonatate (TESSALON) 100 MG capsule Take 1 capsule (100 mg total) by mouth 3 (three) times daily as needed for cough. 30 capsule 0  ? estradiol (ESTRACE) 0.1 MG/GM vaginal cream Place 0.5g nightly for two weeks then twice a week after 30 g 11  ? FARXIGA 10 MG TABS tablet TAKE 1 TABLET BY MOUTH DAILY BEFORE BREAKFAST. 90 tablet 1  ? ibuprofen (ADVIL) 600 MG tablet Take 1 tablet (600 mg total) by mouth every 6 (six) hours as needed. 30 tablet 0  ? metFORMIN (GLUCOPHAGE-XR) 500 MG 24 hr tablet TAKE 2 TABLETS (1,000 MG TOTAL) BY MOUTH 2 (TWO) TIMES DAILY BEFORE A MEAL. 360 tablet 1  ? mupirocin ointment (BACTROBAN) 2 % Apply 1 application topically 2 (two) times daily. 22 g 0  ? ondansetron (ZOFRAN) 4 MG tablet Take 1 tablet (4 mg total) by mouth every 8 (eight) hours as needed for nausea or  vomiting. 20 tablet 0  ? ONETOUCH ULTRA test strip ONE TOUCH. TEST SUGARS ONCE DAILY AS DIRECTED. E11.9 100 strip 3  ? polyethylene glycol powder (GLYCOLAX/MIRALAX) 17 GM/SCOOP powder Take 17 g by mouth daily. Drink 17g (1 scoop) dissolved in water per day. 255 g 0  ? telmisartan (MICARDIS) 40 MG tablet TAKE 1 TABLET BY MOUTH EVERY DAY 90 tablet 1  ? VASCEPA 1 g capsule TAKE 2 CAPSULES BY MOUTH 2 TIMES DAILY. 120 capsule 5  ? ?No current facility-administered medications on file prior to visit.  ? ? ?Review of Systems ? ?   ?Objective:  ?There were no vitals filed for this visit. ?BP Readings from Last 3 Encounters:  ?10/16/21 (!) 154/83  ?10/08/21 118/70  ?09/26/21 122/70  ? ?Wt Readings from Last 3 Encounters:  ?10/16/21 137 lb (62.1 kg)  ?10/08/21 137 lb (62.1 kg)  ?09/26/21 140 lb (63.5 kg)  ? ?There is no height or weight on file to calculate BMI. ? ?  ?Physical Exam ?   ? ? ? ? ? ?Assessment & Plan:  ? ? ?See Problem List for Assessment and Plan of chronic medical problems.  ? ? ? ?This encounter was created in error - please disregard. ?

## 2022-01-30 ENCOUNTER — Encounter: Payer: Self-pay | Admitting: Internal Medicine

## 2022-01-31 NOTE — Patient Instructions (Signed)
     Blood work was ordered.     Medications changes include :   none   Your prescription(s) have been sent to your pharmacy.    A referral was ordered for [facility] for a colonoscopy.     Someone from that office will call you to schedule an appointment.    Return in about 6 months (around 01/01/2023) for Physical Exam.   [facility] Phone: [phone]  

## 2022-02-06 DIAGNOSIS — R0989 Other specified symptoms and signs involving the circulatory and respiratory systems: Secondary | ICD-10-CM | POA: Diagnosis not present

## 2022-02-06 DIAGNOSIS — R059 Cough, unspecified: Secondary | ICD-10-CM | POA: Diagnosis not present

## 2022-02-06 DIAGNOSIS — K219 Gastro-esophageal reflux disease without esophagitis: Secondary | ICD-10-CM | POA: Diagnosis not present

## 2022-02-11 ENCOUNTER — Encounter: Payer: Self-pay | Admitting: Physical Therapy

## 2022-02-11 ENCOUNTER — Other Ambulatory Visit: Payer: Self-pay

## 2022-02-11 ENCOUNTER — Encounter: Payer: Self-pay | Attending: Internal Medicine | Admitting: Physical Therapy

## 2022-02-11 DIAGNOSIS — R278 Other lack of coordination: Secondary | ICD-10-CM | POA: Insufficient documentation

## 2022-02-11 DIAGNOSIS — M6281 Muscle weakness (generalized): Secondary | ICD-10-CM | POA: Insufficient documentation

## 2022-02-11 NOTE — Patient Instructions (Signed)
Access Code: [email or web address] ?URL: [email or web address] ?Date: 02/11/2022 ?Prepared by: Eulis Foster ? ?Exercises ?Bear Plank from [place] - 1 x daily - 3 x weekly - 1 sets - 10 reps - 3 sec hold ?Anti-Rotation Lateral Stepping with Press - 1 x daily - 3 x weekly - 1 sets - 10 reps ?Seated Quick Flick Pelvic Floor Contractions - 3 x daily - 7 x weekly - 1 sets - 5 reps ?Seated Pelvic Floor Contraction - 3 x daily - 7 x weekly - 1 sets - 10 reps - 5 sec hold ?Marching Bridge - 1 x daily - 3 x weekly - 2 sets - 10 reps ?Supine Dead Bug with Leg Extension - 1 x daily - 3 x weekly - 2 sets - 10 reps ?Quadruped Pelvic Floor Contraction with Opposite Arm and Leg Lift - 1 x daily - 3 x weekly - 2 sets - 10 reps ?Side Stepping with Resistance at Thighs - 1 x daily - 3 x weekly - 3 sets - 10 reps ?Eulis Foster, PT ?[facility] ?[address], [address] ?[place], [place] [address] ?W: [phone] ?[email or web address] ? ?

## 2022-02-11 NOTE — Therapy (Signed)
[facility] at [facility] ?[address], [address] ?[place], [place], [address] ?Phone: [phone]   Fax:  [phone] ? ?Physical Therapy Treatment ? ?Patient Details  ?Name: Savannah Rogers ?MRN: [id number] ?Date of Birth: 09-29-1966 ?Referring Provider (PT): Dr. Sherlene Shams ? ? ?Encounter Date: 02/11/2022 ? ? PT End of Session - 02/11/22 1604   ? ? Visit Number 4   ? Date for PT Re-Evaluation 02/25/22   ? Authorization Type BCBS   ? Authorization - Visit Number 4   ? Authorization - Number of Visits 30   ? PT Start Time 1600   ? PT Stop Time 7116   ? PT Time Calculation (min) 45 min   ? Activity Tolerance Patient tolerated treatment well   ? Behavior During Therapy [facility] for tasks assessed/performed   ? ?  ?  ? ?  ? ? ?Past Medical History:  ?Diagnosis Date  ? COVID 10/2020  ? cough fever x 10 days all symptoms resolved  ? Hyperlipidemia   ? Hypertension   ? type 2 dm   ? Umbilical hernia 57/90/3833  ? small no problems with per pt  ? ? ?Past Surgical History:  ?Procedure Laterality Date  ? BONE MARROW BIOPSY  2006  ? for elevated WBC  ? CHOLECYSTECTOMY  03/16/2012  ? INTRAOPERATIVE CHOLANGIOGRAM;  Surgeon: Earnstine Regal, MD;  Location: [facility];  Service: General;  Laterality: N/A;  ? CYSTOSCOPY N/A 08/13/2021  ? Procedure: CYSTOSCOPY;  Surgeon: Jaquita Folds, MD;  Location: [facility];  Service: Gynecology;  Laterality: N/A;  ? G3  P1  A2    ? laparascopic cholecystectomy  03/2012  ? Dr Harlow Asa  ? XI ROBOTIC ASSISTED TOTAL HYSTERECTOMY WITH SACROCOLPOPEXY N/A 08/13/2021  ? Procedure: XI ROBOTIC ASSISTED TOTAL HYSTERECTOMY AND BILATERAL SALPINGECTOMY WITH SACROCOLPOPEXY;  Surgeon: Jaquita Folds, MD;  Location: [facility];  Service: Gynecology;  Laterality: N/A;  ? ? ?There were no vitals filed for this visit. ? ? Subjective Assessment - 02/11/22 1605   ? ? Subjective I am alot better.   ? Patient Stated Goals reduce leakage   ?  Currently in Pain? No/denies   ? Multiple Pain Sites No   ? ?  ?  ? ?  ? ? ? ? ? [facility] PT Assessment - 02/11/22 0001   ? ?  ? Assessment  ? Medical Diagnosis N39.3 SUI   ? Referring Provider (PT) Dr. Sherlene Shams   ? Onset Date/Surgical Date 08/13/21   ? Prior Therapy none   ?  ? Precautions  ? Precautions None   ?  ? Restrictions  ? Weight Bearing Restrictions No   ?  ? Home Environment  ? Living Environment Private residence   ?  ? Prior Function  ? Level of Independence Independent   ? Associate Professor with stand and sit   ? Leisure cycling and other exercise prior to surgery   ?  ? Cognition  ? Overall Cognitive Status Within Functional Limits for tasks assessed   ?  ? Strength  ? Right Hip Flexion 5/5   ? Right Hip Extension 4/5   ? Right Hip ABduction 5/5   ? Left Hip Extension 4/5   ? Left Hip ABduction 5/5   ? ?  ?  ? ?  ? ? ? ? ? ? ? ? ? ? ? ? ? Pelvic Floor Special Questions - 02/11/22 0001   ? ?  Urinary Leakage No   ? Strength fair squeeze, definite lift   ? ?  ?  ? ?  ? ? ? ? [facility] PT Treatment/Exercise - 02/11/22 0001   ? ?  ? Lumbar Exercises: Standing  ? Other Standing Lumbar Exercises side stepping with red band around thighs and contracting the pelvic floor 10 feet 6 times   ?  ? Lumbar Exercises: Supine  ? Dead Bug 20 reps;1 second   ? Dead Bug Limitations with red band around knees then do with straight leg raise 10x each leg   ? Bridge with March 10 reps;1 second   ? Bridge with [employer] Limitations each leg   ?  ? Lumbar Exercises: Quadruped  ? Opposite Arm/Leg Raise Right arm/Left leg;Left arm/Right leg;10 reps   ? Other Quadruped Lumbar Exercises quadruped bear plank hold 5 seconds 5 times   ? ?  ?  ? ?  ? ? ? ? ? ? ? ? ? ? PT Education - 02/11/22 1633   ? ? Education Details Access Code: [email or web address]   ? Person(s) Educated Patient   ? Methods Explanation;Demonstration;Verbal cues;Handout   ? Comprehension Verbalized understanding;Returned demonstration   ? ?  ?   ? ?  ? ? ? PT Short Term Goals - 02/11/22 1605   ? ?  ? PT SHORT TERM GOAL #1  ? Title Patient is able to contract the pelvic floor and fully relax  quickly due to improve diaphragmatic breathing   ? Time 4   ? Period Weeks   ? Status Achieved   ? Target Date 12/31/21   ?  ? PT SHORT TERM GOAL #2  ? Title Patient is able to contract the abdominals without bulging the umbilical hernia due to improve muscle coordination and strength   ? Time 4   ? Period Weeks   ? Status Achieved   ? ?  ?  ? ?  ? ? ? ? PT Long Term Goals - 02/11/22 1605   ? ?  ? PT LONG TERM GOAL #1  ? Title Patient is independent with pelvic floor and core strength to reduce urinary leakage   ? Time 12   ? Period Weeks   ? Status Achieved   ?  ? PT LONG TERM GOAL #2  ? Title Patient is able to cough and sneeze without urinary leakage due to increased strength and pelvic floor coordination   ? Time 12   ? Period Weeks   ? Status Achieved   ? Target Date 02/25/22   ?  ? PT LONG TERM GOAL #3  ? Title Pelvic floor strength is >/= 3/5 with quick contraction and quick release and not bulging her abdomen   ? Time 12   ? Period Weeks   ? Status Achieved   ? Target Date 02/25/22   ? ?  ?  ? ?  ? ? ? ? ? ? ? ? Plan - 02/11/22 1634   ? ? Clinical Impression Statement Patient has not had urinary leakage since last visit. Her hip strength has increased. She is independent  with current HEP that was progressed today. Pelvic floor strength is 3/5. She is able to engage her abdominals with her exercises. Patient is ready for discharge.   ? Personal Factors and Comorbidities Comorbidity 2   ? Comorbidities Umbilical hernia; robotic total hysterectomy with sacrocolpopexy 08/13/2021   ? Examination-Activity Limitations Continence;Other   cough, sneeze  ? Stability/Clinical Decision  Making Stable/Uncomplicated   ? Rehab Potential Excellent   ? PT Treatment/Interventions ADLs/Self Care Home Management;Biofeedback;Neuromuscular re-education;Therapeutic  exercise;Therapeutic activities;Patient/family education;Manual techniques   ? PT Next Visit Plan Discharge to HEP   ? PT Home Exercise Plan Access Code: [email or web address]   ? Consulted and Agree with Plan of Care Patient   ? ?  ?  ? ?  ? ? ?Patient will benefit from skilled therapeutic intervention in order to improve the following deficits and impairments:  Decreased activity tolerance, Decreased strength, Decreased coordination ? ?Visit Diagnosis: ?Muscle weakness (generalized) ? ?Other lack of coordination ? ? ? ? ?Problem List ?Patient Active Problem List  ? Diagnosis Date Noted  ? Infected sebaceous cyst 10/08/2021  ? Abscess of right breast 09/26/2021  ? S/P hysterectomy-complete, 08/2021 09/23/2021  ? Brittle nails 06/24/2021  ? Second degree burn of nose, initial encounter 04/16/2020  ? Fatigue 03/22/2019  ? Umbilical hernia without obstruction and without gangrene 12/14/2018  ? Gastroesophageal reflux disease 12/14/2018  ? Diabetes ([place]) 09/09/2017  ? Pancreatitis 03/03/2017  ? Abnormal CXR 04/19/2016  ? Upper airway cough syndrome likely related to allergic rhinitis  04/10/2016  ? Essential hypertension, benign 03/05/2016  ? Non-allergic rhinitis 06/13/2015  ? Vitamin D deficiency 05/22/2015  ? Hyperuricemia 03/24/2013  ? Mixed hyperlipidemia 03/06/2008  ? ? ?Earlie Counts, PT ?02/11/22 4:37 PM ? ?[facility] ?[facility]patient Rehabilitation at [facility] ?[address], [address] ?[place], [place], [address] ?Phone: [phone]   Fax:  [phone] ? ?Name: Savannah Rogers ?MRN: [id number] ?Date of Birth: [birth year] ?PHYSICAL THERAPY DISCHARGE SUMMARY ? ?Visits from Start of Care: 4 ? ?Current functional level related to goals / functional outcomes: ?See above.  ?  ?Remaining deficits: ?See above.  ?  ?Education / Equipment: ?HEP  ? ?Patient agrees to discharge. Patient goals were met. Patient is being discharged due to meeting the stated rehab goals. Thank you for the referral. Earlie Counts, PT ?02/11/22 4:38  PM ? ? ? ? ?

## 2022-02-15 ENCOUNTER — Other Ambulatory Visit: Payer: Self-pay | Admitting: Internal Medicine

## 2022-02-27 ENCOUNTER — Other Ambulatory Visit: Payer: Self-pay | Admitting: Internal Medicine

## 2022-03-25 ENCOUNTER — Ambulatory Visit: Payer: BC Managed Care – PPO | Admitting: Internal Medicine

## 2022-03-26 ENCOUNTER — Ambulatory Visit: Payer: BC Managed Care – PPO | Admitting: Internal Medicine

## 2022-04-16 ENCOUNTER — Ambulatory Visit: Payer: BC Managed Care – PPO | Admitting: Obstetrics and Gynecology

## 2022-04-17 ENCOUNTER — Encounter: Payer: Self-pay | Admitting: Obstetrics and Gynecology

## 2022-04-17 ENCOUNTER — Ambulatory Visit: Payer: BC Managed Care – PPO | Admitting: Obstetrics and Gynecology

## 2022-04-17 VITALS — BP 125/83 | HR 73

## 2022-04-17 DIAGNOSIS — N393 Stress incontinence (female) (male): Secondary | ICD-10-CM | POA: Diagnosis not present

## 2022-04-17 NOTE — Progress Notes (Signed)
[facility]  Date of Visit: 04/17/2022  History of Present Illness: [name] is a 56 y.o. female scheduled today for a post-operative visit.   Surgery: s/p Robotic assisted total laparoscopic hysterectomy with bilateral salpingectomy, sacrocolpopexy, cystoscopy on 08/13/21  Has seen some improvement with her leakage. She attended physical therapy sessions. She does not feel the leakage is an issue for her now, occurs rarely.   Has not had any further issues with her breast abscess.   Medications: She has a current medication list which includes the following prescription(s): acetaminophen, allopurinol, atorvastatin, benzonatate, estradiol, farxiga, ibuprofen, metformin, mupirocin ointment, ondansetron, onetouch ultra, polyethylene glycol powder, telmisartan, and vascepa.   Allergies: Patient has No Known Allergies.   Physical Exam: BP 125/83   Pulse 73   LMP  (LMP Unknown) Comment: LMP 2018   Gen: NAD  POP-Q (09/24/21): POP-Q   -3                                            Aa   -3                                           Ba   -8                                              C    3.5                                            Gh   4                                            Pb   8                                            tvl    -2                                            Ap   -2                                            Bp                                                  D     ---------------------------------------------------------  Assessment and Plan:  1. SUI (stress urinary incontinence, female)    - Improved with  PT, now manageable. Discussed options of treatment with sling or urethral bulking and she does not want to pursue at this time.   Return as needed  Jaquita Folds, MD  Time spent: I spent 20 minutes dedicated to the care of this patient on the date of this encounter to include pre-visit review of records,  face-to-face time with the patient and post visit documentation.

## 2022-04-18 ENCOUNTER — Encounter: Payer: Self-pay | Admitting: Obstetrics and Gynecology

## 2022-04-21 ENCOUNTER — Encounter: Payer: Self-pay | Admitting: Internal Medicine

## 2022-04-21 NOTE — Patient Instructions (Addendum)
     Blood work was ordered.     Medications changes include :      Your prescription(s) have been sent to your pharmacy.    A referral was ordered for [facility].     Someone from that office will call you to schedule an appointment.    Return in about 6 months (around 10/23/2022) for Physical Exam.  

## 2022-04-21 NOTE — Progress Notes (Unsigned)
Subjective:    Patient ID: Savannah Rogers, female    DOB: August 14, 1966, 56 y.o.   MRN: [id number]     HPI Savannah Rogers is here for follow up of her chronic medical problems, including htn, DM, hld, hyperuricemia  She is exercising regularly.   She does feel tired.  She probably sleeps about 4 hrs at night.   Medications and allergies reviewed with patient and updated if appropriate.  Current Outpatient Medications on File Prior to Visit  Medication Sig Dispense Refill   acetaminophen (TYLENOL) 500 MG tablet Take 1 tablet (500 mg total) by mouth every 6 (six) hours as needed (pain). 30 tablet 0   allopurinol (ZYLOPRIM) 100 MG tablet TAKE 1 TABLET BY MOUTH EVERY DAY 90 tablet 1   atorvastatin (LIPITOR) 20 MG tablet TAKE 1 TABLET BY MOUTH EVERY DAY 90 tablet 1   estradiol (ESTRACE) 0.1 MG/GM vaginal cream Place 0.5g nightly for two weeks then twice a week after 30 g 11   FARXIGA 10 MG TABS tablet TAKE 1 TABLET BY MOUTH DAILY BEFORE BREAKFAST. 90 tablet 1   ibuprofen (ADVIL) 600 MG tablet Take 1 tablet (600 mg total) by mouth every 6 (six) hours as needed. 30 tablet 0   metFORMIN (GLUCOPHAGE-XR) 500 MG 24 hr tablet TAKE 2 TABLETS (1,000 MG TOTAL) BY MOUTH 2 (TWO) TIMES DAILY BEFORE A MEAL. 360 tablet 1   ondansetron (ZOFRAN) 4 MG tablet Take 1 tablet (4 mg total) by mouth every 8 (eight) hours as needed for nausea or vomiting. 20 tablet 0   ONETOUCH ULTRA test strip ONE TOUCH. TEST SUGARS ONCE DAILY AS DIRECTED. E11.9 100 strip 3   polyethylene glycol powder (GLYCOLAX/MIRALAX) 17 GM/SCOOP powder Take 17 g by mouth daily. Drink 17g (1 scoop) dissolved in water per day. 255 g 0   telmisartan (MICARDIS) 40 MG tablet TAKE 1 TABLET BY MOUTH EVERY DAY 90 tablet 1   VASCEPA 1 g capsule TAKE 2 CAPSULES BY MOUTH 2 TIMES DAILY. 120 capsule 5   No current facility-administered medications on file prior to visit.     Review of Systems  Constitutional:  Negative for chills and fever.  Respiratory:   Negative for cough, shortness of breath and wheezing.   Cardiovascular:  Negative for chest pain, palpitations and leg swelling.  Gastrointestinal:  Negative for abdominal pain.       Gerd  Neurological:  Negative for light-headedness and headaches.      Objective:   Vitals:   04/22/22 1044  BP: 116/70  Pulse: 82  Temp: 97.9 F (36.6 C)  SpO2: 98%   BP Readings from Last 3 Encounters:  04/22/22 116/70  04/17/22 125/83  10/16/21 (!) 154/83   Wt Readings from Last 3 Encounters:  04/22/22 138 lb (62.6 kg)  10/16/21 137 lb (62.1 kg)  10/08/21 137 lb (62.1 kg)   Body mass index is 25.24 kg/m.    Physical Exam Constitutional:      General: She is not in acute distress.    Appearance: Normal appearance.  HENT:     Head: Normocephalic and atraumatic.  Eyes:     Conjunctiva/sclera: Conjunctivae normal.  Cardiovascular:     Rate and Rhythm: Normal rate and regular rhythm.     Heart sounds: Normal heart sounds. No murmur heard. Pulmonary:     Effort: Pulmonary effort is normal. No respiratory distress.     Breath sounds: Normal breath sounds. No wheezing.  Musculoskeletal:     Cervical  back: Neck supple.     Right lower leg: No edema.     Left lower leg: No edema.  Lymphadenopathy:     Cervical: No cervical adenopathy.  Skin:    General: Skin is warm and dry.     Findings: No rash.  Neurological:     Mental Status: She is alert. Mental status is at baseline.  Psychiatric:        Mood and Affect: Mood normal.        Behavior: Behavior normal.       Lab Results  Component Value Date   WBC 12.7 (H) 09/23/2021   HGB 14.4 09/23/2021   HCT 43.3 09/23/2021   PLT 378.0 09/23/2021   GLUCOSE 95 09/23/2021   CHOL 169 09/23/2021   TRIG 279.0 (H) 09/23/2021   HDL 47.30 09/23/2021   LDLDIRECT 94.0 09/23/2021   LDLCALC 87 08/28/2016   ALT 35 09/23/2021   AST 26 09/23/2021   NA 137 09/23/2021   K 4.1 09/23/2021   CL 98 09/23/2021   CREATININE 0.60 09/23/2021   BUN  16 09/23/2021   CO2 25 09/23/2021   TSH 2.60 06/19/2021   HGBA1C 6.9 (H) 09/23/2021   MICROALBUR <0.7 09/23/2021     Assessment & Plan:    See Problem List for Assessment and Plan of chronic medical problems.

## 2022-04-22 ENCOUNTER — Ambulatory Visit (INDEPENDENT_AMBULATORY_CARE_PROVIDER_SITE_OTHER): Payer: BC Managed Care – PPO | Admitting: Internal Medicine

## 2022-04-22 VITALS — BP 116/70 | HR 82 | Temp 97.9°F | Ht 62.0 in | Wt 138.0 lb

## 2022-04-22 DIAGNOSIS — R208 Other disturbances of skin sensation: Secondary | ICD-10-CM | POA: Diagnosis not present

## 2022-04-22 DIAGNOSIS — E782 Mixed hyperlipidemia: Secondary | ICD-10-CM

## 2022-04-22 DIAGNOSIS — I10 Essential (primary) hypertension: Secondary | ICD-10-CM | POA: Diagnosis not present

## 2022-04-22 DIAGNOSIS — E790 Hyperuricemia without signs of inflammatory arthritis and tophaceous disease: Secondary | ICD-10-CM

## 2022-04-22 DIAGNOSIS — E1165 Type 2 diabetes mellitus with hyperglycemia: Secondary | ICD-10-CM

## 2022-04-22 LAB — LIPID PANEL
Cholesterol: 143 mg/dL (ref 0–200)
HDL: 43.5 mg/dL (ref >39.00)
NonHDL: 99.62
Total CHOL/HDL Ratio: 3
Triglycerides: 263 mg/dL — ABNORMAL HIGH (ref 0.0–149.0)
VLDL: 52.6 mg/dL — ABNORMAL HIGH (ref 0.0–40.0)

## 2022-04-22 LAB — COMPREHENSIVE METABOLIC PANEL
ALT: 26 U/L (ref 0–35)
AST: 21 U/L (ref 0–37)
Albumin: 4.6 g/dL (ref 3.5–5.2)
Alkaline Phosphatase: 74 U/L (ref 39–117)
BUN: 16 mg/dL (ref 6–23)
CO2: 28 mEq/L (ref 19–32)
Calcium: 10 mg/dL (ref 8.4–10.5)
Chloride: 99 mEq/L (ref 96–112)
Creatinine, Ser: 0.63 mg/dL (ref 0.40–1.20)
GFR: 98.55 mL/min (ref >60.00)
Glucose, Bld: 107 mg/dL — ABNORMAL HIGH (ref 70–99)
Potassium: 4.3 mEq/L (ref 3.5–5.1)
Sodium: 135 mEq/L (ref 135–145)
Total Bilirubin: 0.5 mg/dL (ref 0.2–1.2)
Total Protein: 7.8 g/dL (ref 6.0–8.3)

## 2022-04-22 LAB — LDL CHOLESTEROL, DIRECT: Direct LDL: 70 mg/dL

## 2022-04-22 LAB — VITAMIN B12: Vitamin B-12: 649 pg/mL (ref 211–911)

## 2022-04-22 LAB — HEMOGLOBIN A1C: Hgb A1c MFr Bld: 7.4 % — ABNORMAL HIGH (ref 4.6–6.5)

## 2022-04-22 NOTE — Assessment & Plan Note (Addendum)
Chronic Controlled, Stable Continue Farxiga 10 mg daily, metformin XR 1000 mg twice daily

## 2022-04-22 NOTE — Assessment & Plan Note (Signed)
Chronic °Regular exercise and healthy diet encouraged °Check lipid panel  °Continue atorvastatin 20 mg daily °

## 2022-04-22 NOTE — Assessment & Plan Note (Addendum)
Chronic Blood pressure well controlled CMP Continue telmisartan 40 mg daily 

## 2022-04-22 NOTE — Assessment & Plan Note (Addendum)
Chronic Uric acid level has been controlled Continue allopurinol 100 mg daily

## 2022-04-22 NOTE — Assessment & Plan Note (Signed)
New Intermittent in hands and feet Check B12 level

## 2022-04-24 DIAGNOSIS — H5202 Hypermetropia, left eye: Secondary | ICD-10-CM | POA: Diagnosis not present

## 2022-04-24 DIAGNOSIS — E119 Type 2 diabetes mellitus without complications: Secondary | ICD-10-CM | POA: Diagnosis not present

## 2022-04-24 LAB — HM DIABETES EYE EXAM

## 2022-04-29 ENCOUNTER — Other Ambulatory Visit: Payer: Self-pay | Admitting: Internal Medicine

## 2022-06-09 ENCOUNTER — Encounter: Payer: Self-pay | Admitting: Internal Medicine

## 2022-06-09 NOTE — Progress Notes (Signed)
Outside notes received. Information abstracted. Notes sent to scan.  

## 2022-06-11 ENCOUNTER — Encounter: Payer: BC Managed Care – PPO | Admitting: Obstetrics and Gynecology

## 2022-06-23 ENCOUNTER — Encounter: Payer: Self-pay | Admitting: Medical

## 2022-06-23 ENCOUNTER — Ambulatory Visit (INDEPENDENT_AMBULATORY_CARE_PROVIDER_SITE_OTHER): Payer: BC Managed Care – PPO | Admitting: Medical

## 2022-06-23 ENCOUNTER — Other Ambulatory Visit: Payer: Self-pay

## 2022-06-23 VITALS — BP 133/76 | HR 70 | Ht 62.0 in | Wt 142.3 lb

## 2022-06-23 DIAGNOSIS — Z01419 Encounter for gynecological examination (general) (routine) without abnormal findings: Secondary | ICD-10-CM

## 2022-06-23 DIAGNOSIS — Z1231 Encounter for screening mammogram for malignant neoplasm of breast: Secondary | ICD-10-CM

## 2022-06-23 NOTE — Progress Notes (Signed)
   History:  Ms. Savannah Rogers is a 56 y.o. [id number] who presents to clinic today for annual exam. The patient is new to our office. She had a normal pap smear in 2022 prior to hysterectomy for prolapse. She denies abnormal discharge, bleeding, pelvic pain, UTI symptoms, GI issues or breast concerns. Mammogram is due in December.   The following portions of the patient's history were reviewed and updated as appropriate: allergies, current medications, family history, past medical history, social history, past surgical history and problem list.  Review of Systems:  Review of Systems  Constitutional:  Negative for fever and malaise/fatigue.  Gastrointestinal:  Negative for abdominal pain, constipation, diarrhea, nausea and vomiting.  Genitourinary:  Negative for dysuria, frequency and urgency.       Neg - vaginal bleeding, discharge, pelvic pain      Objective:  Physical Exam BP 133/76   Pulse 70   Ht 5\' 2"  (1.575 m)   Wt 142 lb 4.8 oz (64.5 kg)   LMP  (LMP Unknown) Comment: LMP 2018  BMI 26.03 kg/m  Physical Exam Exam conducted with a chaperone present.  Constitutional:      General: She is not in acute distress.    Appearance: Normal appearance. She is normal weight.  Neck:     Thyroid: No thyromegaly.  Cardiovascular:     Rate and Rhythm: Normal rate and regular rhythm.     Heart sounds: No murmur heard. Pulmonary:     Effort: Pulmonary effort is normal. No respiratory distress.     Breath sounds: Normal breath sounds. No wheezing.  Abdominal:     General: Abdomen is flat. Bowel sounds are normal. There is no distension.     Palpations: Abdomen is soft. There is no mass.     Tenderness: There is no abdominal tenderness. There is no guarding or rebound.  Genitourinary:    General: Normal vulva.     Cervix: No cervical motion tenderness.     Uterus: Absent.      Adnexa:        Right: No mass or tenderness.         Left: No mass or tenderness.    Musculoskeletal:      Cervical back: Neck supple.  Skin:    General: Skin is warm and dry.     Coloration: Skin is not pale.  Neurological:     Mental Status: She is alert and oriented to person, place, and time.  Psychiatric:        Mood and Affect: Mood normal.     Health Maintenance Due  Topic Date Due   Zoster Vaccines- Shingrix (1 of 2) Never done   FOOT EXAM  09/16/2019   COVID-19 Vaccine (3 - Pfizer series) 04/30/2020    Labs, imaging and previous visits in Epic reviewed  Assessment & Plan:  1. Encounter for annual routine gynecological examination - pap smear no longer indicated post hysterectomy, patient advised of current guidelines and voiced understanding   2. Encounter for screening mammogram for malignant neoplasm of breast - MM Digital Screening; Future  Return to [facility] in 1 year or PRN  Also advised that since pap is no longer indicated, she can see PCP for annual physical if desired  05/02/2020 06/23/2022 9:02 AM

## 2022-07-04 ENCOUNTER — Other Ambulatory Visit: Payer: Self-pay | Admitting: Internal Medicine

## 2022-08-30 ENCOUNTER — Other Ambulatory Visit: Payer: Self-pay | Admitting: Internal Medicine

## 2022-09-21 ENCOUNTER — Other Ambulatory Visit: Payer: Self-pay | Admitting: Internal Medicine

## 2022-09-28 ENCOUNTER — Other Ambulatory Visit: Payer: Self-pay | Admitting: Internal Medicine

## 2022-10-28 ENCOUNTER — Encounter: Payer: Self-pay | Admitting: Internal Medicine

## 2022-10-28 NOTE — Progress Notes (Signed)
Subjective:    Patient ID: Savannah Rogers, female    DOB: 16-Jul-1966, 56 y.o.   MRN: [id number]      HPI Savannah Rogers is here for a Physical exam.    Sugars are controlled.    Dry throat - when sleeping. Tongue feels burning all the time.  Has frequent GERD.     Medications and allergies reviewed with patient and updated if appropriate.  Current Outpatient Medications on File Prior to Visit  Medication Sig Dispense Refill   acetaminophen (TYLENOL) 500 MG tablet Take 1 tablet (500 mg total) by mouth every 6 (six) hours as needed (pain). 30 tablet 0   allopurinol (ZYLOPRIM) 100 MG tablet TAKE 1 TABLET BY MOUTH DAILY. ANNUAL APPT DUE IN NOV MUST SEE PROVIDER FOR FUTURE REFILLS 90 tablet 0   atorvastatin (LIPITOR) 20 MG tablet TAKE 1 TABLET BY MOUTH EVERY DAY 90 tablet 1   dapagliflozin propanediol (FARXIGA) 10 MG TABS tablet Take 1 tablet (10 mg total) by mouth daily. Annual appt due in Nov must see provider for future refills 90 tablet 0   estradiol (ESTRACE) 0.1 MG/GM vaginal cream Place 0.5g nightly for two weeks then twice a week after 30 g 11   metFORMIN (GLUCOPHAGE-XR) 500 MG 24 hr tablet TAKE 2 TABLETS (1,000 MG TOTAL) BY MOUTH 2 (TWO) TIMES DAILY BEFORE A MEAL. 360 tablet 1   ondansetron (ZOFRAN) 4 MG tablet Take 1 tablet (4 mg total) by mouth every 8 (eight) hours as needed for nausea or vomiting. 20 tablet 0   ONETOUCH ULTRA test strip ONE TOUCH. TEST SUGARS ONCE DAILY AS DIRECTED. E11.9 100 strip 3   polyethylene glycol powder (GLYCOLAX/MIRALAX) 17 GM/SCOOP powder Take 17 g by mouth daily. Drink 17g (1 scoop) dissolved in water per day. 255 g 0   telmisartan (MICARDIS) 40 MG tablet TAKE 1 TABLET BY MOUTH EVERY DAY 90 tablet 1   No current facility-administered medications on file prior to visit.    Review of Systems  Constitutional:  Negative for fever.  Eyes:  Negative for visual disturbance.  Respiratory:  Negative for cough, shortness of breath and wheezing.    Cardiovascular:  Negative for chest pain, palpitations and leg swelling.  Gastrointestinal:  Positive for diarrhea (sometimes). Negative for abdominal pain, blood in stool, constipation and nausea.       GERD  Genitourinary:  Negative for dysuria.  Musculoskeletal:  Negative for arthralgias and back pain.  Skin:  Positive for color change (left upper  - not itchy, getting larger - she will monitor). Negative for rash.  Neurological:  Negative for light-headedness and headaches.  Psychiatric/Behavioral:  Negative for dysphoric mood. The patient is not nervous/anxious.        Objective:   Vitals:   10/29/22 1121  BP: 114/78  Pulse: 72  Temp: 98.1 F (36.7 C)  SpO2: 98%   Filed Weights   10/29/22 1121  Weight: 140 lb (63.5 kg)   Body mass index is 25.61 kg/m.  BP Readings from Last 3 Encounters:  10/29/22 114/78  06/23/22 133/76  04/22/22 116/70    Wt Readings from Last 3 Encounters:  10/29/22 140 lb (63.5 kg)  06/23/22 142 lb 4.8 oz (64.5 kg)  04/22/22 138 lb (62.6 kg)       Physical Exam Constitutional: She appears well-developed and well-nourished. No distress.  HENT:  Head: Normocephalic and atraumatic.  Right Ear: External ear normal. Normal ear canal and TM Left Ear: External ear normal.  Normal  ear canal and TM Mouth/Throat: Oropharynx is clear and moist.  Eyes: Conjunctivae normal.  Neck: Neck supple. No tracheal deviation present. No thyromegaly present.  No carotid bruit  Cardiovascular: Normal rate, regular rhythm and normal heart sounds.   No murmur heard.  No edema. Pulmonary/Chest: Effort normal and breath sounds normal. No respiratory distress. She has no wheezes. She has no rales.  Breast: deferred   Abdominal: Soft. She exhibits no distension. There is no tenderness.  Lymphadenopathy: She has no cervical adenopathy.  Skin: Skin is warm and dry. She is not diaphoretic.  Psychiatric: She has a normal mood and affect. Her behavior is normal.      Lab Results  Component Value Date   WBC 12.7 (H) 09/23/2021   HGB 14.4 09/23/2021   HCT 43.3 09/23/2021   PLT 378.0 09/23/2021   GLUCOSE 107 (H) 04/22/2022   CHOL 143 04/22/2022   TRIG 263.0 (H) 04/22/2022   HDL 43.50 04/22/2022   LDLDIRECT 70.0 04/22/2022   LDLCALC 87 08/28/2016   ALT 26 04/22/2022   AST 21 04/22/2022   NA 135 04/22/2022   K 4.3 04/22/2022   CL 99 04/22/2022   CREATININE 0.63 04/22/2022   BUN 16 04/22/2022   CO2 28 04/22/2022   TSH 2.60 06/19/2021   HGBA1C 7.4 (H) 04/22/2022   MICROALBUR <0.7 09/23/2021         Assessment & Plan:   Physical exam: Screening blood work  ordered Exercise  regular Weight  normal Substance abuse  none   Reviewed recommended immunizations.Flu immunization administered today.     Health Maintenance  Topic Date Due   Zoster Vaccines- Shingrix (1 of 2) Never done   FOOT EXAM  09/16/2019   INFLUENZA VACCINE  07/01/2022   COVID-19 Vaccine (3 - 2023-24 season) 08/01/2022   Diabetic kidney evaluation - Urine ACR  09/23/2022   HEMOGLOBIN A1C  10/23/2022   Diabetic kidney evaluation - GFR measurement  04/23/2023   OPHTHALMOLOGY EXAM  04/25/2023   MAMMOGRAM  11/22/2023   COLONOSCOPY (Pts 45-57yrs Insurance coverage will need to be confirmed)  08/09/2025   Hepatitis C Screening  Completed   HIV Screening  Completed   HPV VACCINES  Aged Out          See Problem List for Assessment and Plan of chronic medical problems.

## 2022-10-28 NOTE — Patient Instructions (Addendum)
Blood work was ordered.   The lab is on the first floor.    Medications changes include :   None    Return in about 6 months (around 04/29/2023) for follow up.   Health Maintenance, Female Adopting a healthy lifestyle and getting preventive care are important in promoting health and wellness. Ask your health care provider about: The right schedule for you to have regular tests and exams. Things you can do on your own to prevent diseases and keep yourself healthy. What should I know about diet, weight, and exercise? Eat a healthy diet  Eat a diet that includes plenty of vegetables, fruits, low-fat dairy products, and lean protein. Do not eat a lot of foods that are high in solid fats, added sugars, or sodium. Maintain a healthy weight Body mass index (BMI) is used to identify weight problems. It estimates body fat based on height and weight. Your health care provider can help determine your BMI and help you achieve or maintain a healthy weight. Get regular exercise Get regular exercise. This is one of the most important things you can do for your health. Most adults should: Exercise for at least 150 minutes each week. The exercise should increase your heart rate and make you sweat (moderate-intensity exercise). Do strengthening exercises at least twice a week. This is in addition to the moderate-intensity exercise. Spend less time sitting. Even light physical activity can be beneficial. Watch cholesterol and blood lipids Have your blood tested for lipids and cholesterol at 57 years of age, then have this test every 5 years. Have your cholesterol levels checked more often if: Your lipid or cholesterol levels are high. You are older than 57 years of age. You are at high risk for heart disease. What should I know about cancer screening? Depending on your health history and family history, you may need to have cancer screening at various ages. This may include screening  for: Breast cancer. Cervical cancer. Colorectal cancer. Skin cancer. Lung cancer. What should I know about heart disease, diabetes, and high blood pressure? Blood pressure and heart disease High blood pressure causes heart disease and increases the risk of stroke. This is more likely to develop in people who have high blood pressure readings or are overweight. Have your blood pressure checked: Every 3-5 years if you are 68-36 years of age. Every year if you are 73 years old or older. Diabetes Have regular diabetes screenings. This checks your fasting blood sugar level. Have the screening done: Once every three years after age 36 if you are at a normal weight and have a low risk for diabetes. More often and at a younger age if you are overweight or have a high risk for diabetes. What should I know about preventing infection? Hepatitis B If you have a higher risk for hepatitis B, you should be screened for this virus. Talk with your health care provider to find out if you are at risk for hepatitis B infection. Hepatitis C Testing is recommended for: Everyone born from [birth year] through 1966. Anyone with known risk factors for hepatitis C. Sexually transmitted infections (STIs) Get screened for STIs, including gonorrhea and chlamydia, if: You are sexually active and are younger than 57 years of age. You are older than 57 years of age and your health care provider tells you that you are at risk for this type of infection. Your sexual activity has changed since you were last screened, and you are at increased  risk for chlamydia or gonorrhea. Ask your health care provider if you are at risk. Ask your health care provider about whether you are at high risk for HIV. Your health care provider may recommend a prescription medicine to help prevent HIV infection. If you choose to take medicine to prevent HIV, you should first get tested for HIV. You should then be tested every 3 months for as long as you  are taking the medicine. Pregnancy If you are about to stop having your period (premenopausal) and you may become pregnant, seek counseling before you get pregnant. Take 400 to 800 micrograms (mcg) of folic acid every day if you become pregnant. Ask for birth control (contraception) if you want to prevent pregnancy. Osteoporosis and menopause Osteoporosis is a disease in which the bones lose minerals and strength with aging. This can result in bone fractures. If you are 57 years old or older, or if you are at risk for osteoporosis and fractures, ask your health care provider if you should: Be screened for bone loss. Take a calcium or vitamin D supplement to lower your risk of fractures. Be given hormone replacement therapy (HRT) to treat symptoms of menopause. Follow these instructions at home: Alcohol use Do not drink alcohol if: Your health care provider tells you not to drink. You are pregnant, may be pregnant, or are planning to become pregnant. If you drink alcohol: Limit how much you have to: 0-1 drink a day. Know how much alcohol is in your drink. In the [place] one drink equals one 12 oz bottle of beer (355 mL), one 5 oz glass of wine (148 mL), or one 1 oz glass of hard liquor (44 mL). Lifestyle Do not use any products that contain nicotine or tobacco. These products include cigarettes, chewing tobacco, and vaping devices, such as e-cigarettes. If you need help quitting, ask your health care provider. Do not use street drugs. Do not share needles. Ask your health care provider for help if you need support or information about quitting drugs. General instructions Schedule regular health, dental, and eye exams. Stay current with your vaccines. Tell your health care provider if: You often feel depressed. You have ever been abused or do not feel safe at home. Summary Adopting a healthy lifestyle and getting preventive care are important in promoting health and wellness. Follow your  health care provider's instructions about healthy diet, exercising, and getting tested or screened for diseases. Follow your health care provider's instructions on monitoring your cholesterol and blood pressure. This information is not intended to replace advice given to you by your health care provider. Make sure you discuss any questions you have with your health care provider. Document Revised: 04/08/2021 Document Reviewed: 04/08/2021 Elsevier Patient Education  [place].

## 2022-10-29 ENCOUNTER — Ambulatory Visit (INDEPENDENT_AMBULATORY_CARE_PROVIDER_SITE_OTHER): Payer: BC Managed Care – PPO | Admitting: Internal Medicine

## 2022-10-29 VITALS — BP 114/78 | HR 72 | Temp 98.1°F | Ht 62.0 in | Wt 140.0 lb

## 2022-10-29 DIAGNOSIS — K219 Gastro-esophageal reflux disease without esophagitis: Secondary | ICD-10-CM

## 2022-10-29 DIAGNOSIS — E782 Mixed hyperlipidemia: Secondary | ICD-10-CM | POA: Diagnosis not present

## 2022-10-29 DIAGNOSIS — I10 Essential (primary) hypertension: Secondary | ICD-10-CM | POA: Diagnosis not present

## 2022-10-29 DIAGNOSIS — E1165 Type 2 diabetes mellitus with hyperglycemia: Secondary | ICD-10-CM

## 2022-10-29 DIAGNOSIS — E559 Vitamin D deficiency, unspecified: Secondary | ICD-10-CM

## 2022-10-29 DIAGNOSIS — E790 Hyperuricemia without signs of inflammatory arthritis and tophaceous disease: Secondary | ICD-10-CM | POA: Diagnosis not present

## 2022-10-29 DIAGNOSIS — Z0000 Encounter for general adult medical examination without abnormal findings: Secondary | ICD-10-CM | POA: Diagnosis not present

## 2022-10-29 LAB — COMPREHENSIVE METABOLIC PANEL WITH GFR
ALT: 26 U/L (ref 0–35)
AST: 21 U/L (ref 0–37)
Albumin: 4.7 g/dL (ref 3.5–5.2)
Alkaline Phosphatase: 82 U/L (ref 39–117)
BUN: 17 mg/dL (ref 6–23)
CO2: 27 meq/L (ref 19–32)
Calcium: 9.9 mg/dL (ref 8.4–10.5)
Chloride: 99 meq/L (ref 96–112)
Creatinine, Ser: 0.68 mg/dL (ref 0.40–1.20)
GFR: 96.4 mL/min
Glucose, Bld: 106 mg/dL — ABNORMAL HIGH (ref 70–99)
Potassium: 4.1 meq/L (ref 3.5–5.1)
Sodium: 137 meq/L (ref 135–145)
Total Bilirubin: 0.5 mg/dL (ref 0.2–1.2)
Total Protein: 8.2 g/dL (ref 6.0–8.3)

## 2022-10-29 LAB — VITAMIN D 25 HYDROXY (VIT D DEFICIENCY, FRACTURES): VITD: 52.15 ng/mL (ref 30.00–100.00)

## 2022-10-29 LAB — CBC WITH DIFFERENTIAL/PLATELET
Basophils Absolute: 0.1 K/uL (ref 0.0–0.1)
Basophils Relative: 0.6 % (ref 0.0–3.0)
Eosinophils Absolute: 0.1 K/uL (ref 0.0–0.7)
Eosinophils Relative: 1.2 % (ref 0.0–5.0)
HCT: 45.7 % (ref 36.0–46.0)
Hemoglobin: 15.4 g/dL — ABNORMAL HIGH (ref 12.0–15.0)
Lymphocytes Relative: 35.4 % (ref 12.0–46.0)
Lymphs Abs: 4.2 K/uL — ABNORMAL HIGH (ref 0.7–4.0)
MCHC: 33.8 g/dL (ref 30.0–36.0)
MCV: 85.7 fl (ref 78.0–100.0)
Monocytes Absolute: 0.6 K/uL (ref 0.1–1.0)
Monocytes Relative: 4.7 % (ref 3.0–12.0)
Neutro Abs: 6.9 K/uL (ref 1.4–7.7)
Neutrophils Relative %: 58.1 % (ref 43.0–77.0)
Platelets: 354 K/uL (ref 150.0–400.0)
RBC: 5.33 Mil/uL — ABNORMAL HIGH (ref 3.87–5.11)
RDW: 13 % (ref 11.5–15.5)
WBC: 11.9 K/uL — ABNORMAL HIGH (ref 4.0–10.5)

## 2022-10-29 LAB — LIPID PANEL
Cholesterol: 174 mg/dL (ref 0–200)
HDL: 43.2 mg/dL
NonHDL: 131.13
Total CHOL/HDL Ratio: 4
Triglycerides: 315 mg/dL — ABNORMAL HIGH (ref 0.0–149.0)
VLDL: 63 mg/dL — ABNORMAL HIGH (ref 0.0–40.0)

## 2022-10-29 LAB — MICROALBUMIN / CREATININE URINE RATIO
Creatinine,U: 73 mg/dL
Microalb Creat Ratio: 1 mg/g (ref 0.0–30.0)
Microalb, Ur: <0.7 mg/dL (ref 0.0–1.9)

## 2022-10-29 LAB — LDL CHOLESTEROL, DIRECT: Direct LDL: 91 mg/dL

## 2022-10-29 LAB — HEMOGLOBIN A1C: Hgb A1c MFr Bld: 7.6 % — ABNORMAL HIGH (ref 4.6–6.5)

## 2022-10-29 LAB — URIC ACID: Uric Acid, Serum: 5.4 mg/dL (ref 2.4–7.0)

## 2022-10-29 LAB — TSH: TSH: 2.29 u[IU]/mL (ref 0.35–5.50)

## 2022-10-29 MED ORDER — ICOSAPENT ETHYL 1 G PO CAPS: ORAL_CAPSULE | ORAL | 5 refills | Status: DC

## 2022-10-29 NOTE — Assessment & Plan Note (Signed)
Chronic Taking vitamin D daily Check vitamin D level  

## 2022-10-29 NOTE — Assessment & Plan Note (Signed)
Chronic Blood pressure well controlled CMP Continue telmisartan 40 mg daily 

## 2022-10-29 NOTE — Assessment & Plan Note (Signed)
Chronic Continue allopurinol 100 mg daily Check uric acid level

## 2022-10-29 NOTE — Assessment & Plan Note (Addendum)
Chronic   Lab Results  Component Value Date   HGBA1C 7.4 (H) 04/22/2022   Sugars not ideally controlled by last blood work Sugars well-controlled at home Check A1c, urine microalbumin today Continue Farxiga 10 mg daily, metformin XR 1000 mg twice daily Stressed regular exercise, diabetic diet

## 2022-10-29 NOTE — Assessment & Plan Note (Signed)
Chronic Experiences frequent cold Defers medication Will work on natural ways to control heart -Stressed the importance of getting this controlled and increased risk of esophageal cancer Likely causing dry mouth and tongue issues

## 2022-10-29 NOTE — Assessment & Plan Note (Signed)
Chronic Regular exercise and healthy diet encouraged Check lipid panel  Continue atorvastatin 20 mg daily, Vascepa 2 g twice daily

## 2022-10-30 ENCOUNTER — Telehealth: Payer: Self-pay

## 2022-10-30 NOTE — Telephone Encounter (Signed)
Vaspeca approved Effective from 10/29/2022 through 10/28/2023.

## 2022-12-02 ENCOUNTER — Other Ambulatory Visit: Payer: Self-pay | Admitting: Internal Medicine

## 2022-12-25 ENCOUNTER — Ambulatory Visit
Admission: RE | Admit: 2022-12-25 | Discharge: 2022-12-25 | Disposition: A | Discharge status: Home / Self Care | Payer: BC Managed Care – PPO | Source: Ambulatory Visit | Attending: Medical | Admitting: Medical

## 2022-12-25 DIAGNOSIS — Z1231 Encounter for screening mammogram for malignant neoplasm of breast: Secondary | ICD-10-CM | POA: Diagnosis not present

## 2022-12-30 ENCOUNTER — Other Ambulatory Visit: Payer: Self-pay | Admitting: Medical

## 2022-12-30 DIAGNOSIS — R928 Other abnormal and inconclusive findings on diagnostic imaging of breast: Secondary | ICD-10-CM

## 2023-01-03 ENCOUNTER — Other Ambulatory Visit: Payer: Self-pay | Admitting: Internal Medicine

## 2023-01-14 ENCOUNTER — Ambulatory Visit
Admission: RE | Admit: 2023-01-14 | Discharge: 2023-01-14 | Disposition: A | Discharge status: Home / Self Care | Payer: BC Managed Care – PPO | Source: Ambulatory Visit | Attending: Medical | Admitting: Medical

## 2023-01-14 ENCOUNTER — Ambulatory Visit: Payer: BC Managed Care – PPO

## 2023-01-14 DIAGNOSIS — R922 Inconclusive mammogram: Secondary | ICD-10-CM | POA: Diagnosis not present

## 2023-01-14 DIAGNOSIS — R928 Other abnormal and inconclusive findings on diagnostic imaging of breast: Secondary | ICD-10-CM

## 2023-01-14 DIAGNOSIS — N6001 Solitary cyst of right breast: Secondary | ICD-10-CM | POA: Diagnosis not present

## 2023-03-01 ENCOUNTER — Other Ambulatory Visit: Payer: Self-pay | Admitting: Internal Medicine

## 2023-03-29 ENCOUNTER — Other Ambulatory Visit: Payer: Self-pay | Admitting: Internal Medicine

## 2023-04-17 DIAGNOSIS — H00014 Hordeolum externum left upper eyelid: Secondary | ICD-10-CM | POA: Diagnosis not present

## 2023-04-17 DIAGNOSIS — S0502XA Injury of conjunctiva and corneal abrasion without foreign body, left eye, initial encounter: Secondary | ICD-10-CM | POA: Diagnosis not present

## 2023-04-27 ENCOUNTER — Encounter: Payer: Self-pay | Admitting: Internal Medicine

## 2023-04-27 NOTE — Progress Notes (Unsigned)
Subjective:    Patient ID: Savannah Rogers, female    DOB: 1966-11-07, 57 y.o.   MRN: [id number]     HPI Savannah Rogers is here for follow up of her chronic medical problems.  She is exercising 5 days a week.  Sugar 102-130 at home  Medications and allergies reviewed with patient and updated if appropriate.  Current Outpatient Medications on File Prior to Visit  Medication Sig Dispense Refill   acetaminophen (TYLENOL) 500 MG tablet Take 1 tablet (500 mg total) by mouth every 6 (six) hours as needed (pain). 30 tablet 0   allopurinol (ZYLOPRIM) 100 MG tablet TAKE 1 TABLET BY MOUTH DAILY. ANNUAL APPT DUE IN NOV MUST SEE PROVIDER FOR FUTURE REFILLS 90 tablet 0   atorvastatin (LIPITOR) 20 MG tablet TAKE 1 TABLET BY MOUTH EVERY DAY 90 tablet 1   dapagliflozin propanediol (FARXIGA) 10 MG TABS tablet Take 1 tablet (10 mg total) by mouth daily. 90 tablet 0   estradiol (ESTRACE) 0.1 MG/GM vaginal cream Place 0.5g nightly for two weeks then twice a week after 30 g 11   icosapent Ethyl (VASCEPA) 1 g capsule TAKE 2 CAPSULES BY MOUTH 2 TIMES DAILY. 120 capsule 5   metFORMIN (GLUCOPHAGE-XR) 500 MG 24 hr tablet TAKE 2 TABLETS (1,000 MG TOTAL) BY MOUTH 2 (TWO) TIMES DAILY BEFORE A MEAL. 360 tablet 1   ofloxacin (OCUFLOX) 0.3 % ophthalmic solution Place one drop into the left eye 4 (four) times daily for 5 days.     ONETOUCH ULTRA test strip ONE TOUCH. TEST SUGARS ONCE DAILY AS DIRECTED. E11.9 100 strip 3   polyethylene glycol powder (GLYCOLAX/MIRALAX) 17 GM/SCOOP powder Take 17 g by mouth daily. Drink 17g (1 scoop) dissolved in water per day. 255 g 0   telmisartan (MICARDIS) 40 MG tablet TAKE 1 TABLET BY MOUTH EVERY DAY 90 tablet 1   No current facility-administered medications on file prior to visit.     Review of Systems  Constitutional:  Negative for fever.  Respiratory:  Negative for cough, shortness of breath and wheezing.   Cardiovascular:  Negative for chest pain, palpitations and leg  swelling.  Neurological:  Negative for light-headedness, numbness and headaches.       Objective:   Vitals:   04/29/23 0745  BP: 114/80  Pulse: 75  Temp: 98 F (36.7 C)  SpO2: 96%   BP Readings from Last 3 Encounters:  04/29/23 114/80  10/29/22 114/78  06/23/22 133/76   Wt Readings from Last 3 Encounters:  04/29/23 136 lb (61.7 kg)  10/29/22 140 lb (63.5 kg)  06/23/22 142 lb 4.8 oz (64.5 kg)   Body mass index is 24.87 kg/m.    Physical Exam Constitutional:      General: She is not in acute distress.    Appearance: Normal appearance.  HENT:     Head: Normocephalic and atraumatic.  Eyes:     Conjunctiva/sclera: Conjunctivae normal.  Cardiovascular:     Rate and Rhythm: Normal rate and regular rhythm.     Heart sounds: Normal heart sounds.  Pulmonary:     Effort: Pulmonary effort is normal. No respiratory distress.     Breath sounds: Normal breath sounds. No wheezing.  Musculoskeletal:     Cervical back: Neck supple.     Right lower leg: No edema.     Left lower leg: No edema.  Lymphadenopathy:     Cervical: No cervical adenopathy.  Skin:    General: Skin is warm and  dry.     Findings: No rash.  Neurological:     Mental Status: She is alert. Mental status is at baseline.  Psychiatric:        Mood and Affect: Mood normal.        Behavior: Behavior normal.        Lab Results  Component Value Date   WBC 11.9 (H) 10/29/2022   HGB 15.4 (H) 10/29/2022   HCT 45.7 10/29/2022   PLT 354.0 10/29/2022   GLUCOSE 106 (H) 10/29/2022   CHOL 174 10/29/2022   TRIG 315.0 (H) 10/29/2022   HDL 43.20 10/29/2022   LDLDIRECT 91.0 10/29/2022   LDLCALC 87 08/28/2016   ALT 26 10/29/2022   AST 21 10/29/2022   NA 137 10/29/2022   K 4.1 10/29/2022   CL 99 10/29/2022   CREATININE 0.68 10/29/2022   BUN 17 10/29/2022   CO2 27 10/29/2022   TSH 2.29 10/29/2022   HGBA1C 7.6 (H) 10/29/2022   MICROALBUR <0.7 10/29/2022     Assessment & Plan:    See Problem List for  Assessment and Plan of chronic medical problems.

## 2023-04-27 NOTE — Patient Instructions (Addendum)
      Blood work was ordered.   The lab is on the first floor.    Medications changes include :   none      Return in about 6 months (around 10/30/2023) for Physical Exam.

## 2023-04-28 DIAGNOSIS — E119 Type 2 diabetes mellitus without complications: Secondary | ICD-10-CM | POA: Diagnosis not present

## 2023-04-28 LAB — HM DIABETES EYE EXAM

## 2023-04-29 ENCOUNTER — Ambulatory Visit: Payer: BC Managed Care – PPO | Admitting: Internal Medicine

## 2023-04-29 ENCOUNTER — Encounter: Payer: Self-pay | Admitting: Internal Medicine

## 2023-04-29 VITALS — BP 114/80 | HR 75 | Temp 98.0°F | Ht 62.0 in | Wt 136.0 lb

## 2023-04-29 DIAGNOSIS — E782 Mixed hyperlipidemia: Secondary | ICD-10-CM | POA: Diagnosis not present

## 2023-04-29 DIAGNOSIS — I10 Essential (primary) hypertension: Secondary | ICD-10-CM | POA: Diagnosis not present

## 2023-04-29 DIAGNOSIS — E790 Hyperuricemia without signs of inflammatory arthritis and tophaceous disease: Secondary | ICD-10-CM

## 2023-04-29 DIAGNOSIS — E1165 Type 2 diabetes mellitus with hyperglycemia: Secondary | ICD-10-CM | POA: Diagnosis not present

## 2023-04-29 LAB — COMPREHENSIVE METABOLIC PANEL
ALT: 26 U/L (ref 0–35)
AST: 21 U/L (ref 0–37)
Albumin: 4.5 g/dL (ref 3.5–5.2)
Alkaline Phosphatase: 74 U/L (ref 39–117)
BUN: 13 mg/dL (ref 6–23)
CO2: 28 mEq/L (ref 19–32)
Calcium: 9.9 mg/dL (ref 8.4–10.5)
Chloride: 101 mEq/L (ref 96–112)
Creatinine, Ser: 0.69 mg/dL (ref 0.40–1.20)
GFR: 95.73 mL/min (ref >60.00)
Glucose, Bld: 118 mg/dL — ABNORMAL HIGH (ref 70–99)
Potassium: 4.1 mEq/L (ref 3.5–5.1)
Sodium: 138 mEq/L (ref 135–145)
Total Bilirubin: 0.5 mg/dL (ref 0.2–1.2)
Total Protein: 8 g/dL (ref 6.0–8.3)

## 2023-04-29 LAB — LIPID PANEL
Cholesterol: 127 mg/dL (ref 0–200)
HDL: 40.4 mg/dL (ref >39.00)
NonHDL: 86.86
Total CHOL/HDL Ratio: 3
Triglycerides: 215 mg/dL — ABNORMAL HIGH (ref 0.0–149.0)
VLDL: 43 mg/dL — ABNORMAL HIGH (ref 0.0–40.0)

## 2023-04-29 LAB — CBC WITH DIFFERENTIAL/PLATELET
Basophils Absolute: 0.1 10*3/uL (ref 0.0–0.1)
Basophils Relative: 1.4 % (ref 0.0–3.0)
Eosinophils Absolute: 0.2 10*3/uL (ref 0.0–0.7)
Eosinophils Relative: 2 % (ref 0.0–5.0)
HCT: 43 % (ref 36.0–46.0)
Hemoglobin: 14.3 g/dL (ref 12.0–15.0)
Lymphocytes Relative: 34.5 % (ref 12.0–46.0)
Lymphs Abs: 3.6 10*3/uL (ref 0.7–4.0)
MCHC: 33.2 g/dL (ref 30.0–36.0)
MCV: 87.3 fl (ref 78.0–100.0)
Monocytes Absolute: 0.5 10*3/uL (ref 0.1–1.0)
Monocytes Relative: 4.7 % (ref 3.0–12.0)
Neutro Abs: 6 10*3/uL (ref 1.4–7.7)
Neutrophils Relative %: 57.4 % (ref 43.0–77.0)
Platelets: 344 10*3/uL (ref 150.0–400.0)
RBC: 4.93 Mil/uL (ref 3.87–5.11)
RDW: 12.9 % (ref 11.5–15.5)
WBC: 10.5 10*3/uL (ref 4.0–10.5)

## 2023-04-29 LAB — LDL CHOLESTEROL, DIRECT: Direct LDL: 64 mg/dL

## 2023-04-29 LAB — HEMOGLOBIN A1C: Hgb A1c MFr Bld: 7.8 % — ABNORMAL HIGH (ref 4.6–6.5)

## 2023-04-29 NOTE — Assessment & Plan Note (Signed)
Chronic Uric acid level controlled 6 months ago Continue allopurinol 100 mg daily

## 2023-04-29 NOTE — Assessment & Plan Note (Signed)
Chronic Blood pressure well controlled CMP Continue telmisartan 40 mg daily 

## 2023-04-29 NOTE — Assessment & Plan Note (Signed)
Chronic   Lab Results  Component Value Date   HGBA1C 7.6 (H) 10/29/2022   Sugars not ideally controlled by last blood work Check A1c Continue Farxiga 10 mg daily, metformin XR 1000 mg twice daily Stressed regular exercise, diabetic diet

## 2023-04-29 NOTE — Assessment & Plan Note (Signed)
Chronic Regular exercise and healthy diet encouraged Check lipid panel  Continue atorvastatin 20 mg daily, Vascepa 2 g twice daily 

## 2023-05-05 ENCOUNTER — Other Ambulatory Visit: Payer: Self-pay | Admitting: Internal Medicine

## 2023-05-25 ENCOUNTER — Other Ambulatory Visit: Payer: Self-pay | Admitting: Internal Medicine

## 2023-08-11 NOTE — Progress Notes (Unsigned)
Subjective:    Patient ID: Savannah Rogers, female    DOB: 09/27/1966, 57 y.o.   MRN: [id number]      HPI Savannah Rogers is here for No chief complaint on file.   She is here for an acute visit for cold symptoms.   Her symptoms started   She is experiencing   She has tried taking       Medications and allergies reviewed with patient and updated if appropriate.  Current Outpatient Medications on File Prior to Visit  Medication Sig Dispense Refill   acetaminophen (TYLENOL) 500 MG tablet Take 1 tablet (500 mg total) by mouth every 6 (six) hours as needed (pain). 30 tablet 0   allopurinol (ZYLOPRIM) 100 MG tablet TAKE 1 TABLET BY MOUTH DAILY. ANNUAL APPT DUE IN NOV MUST SEE PROVIDER FOR FUTURE REFILLS 90 tablet 0   atorvastatin (LIPITOR) 20 MG tablet TAKE 1 TABLET BY MOUTH EVERY DAY 90 tablet 1   dapagliflozin propanediol (FARXIGA) 10 MG TABS tablet Take 1 tablet (10 mg total) by mouth daily. 90 tablet 0   estradiol (ESTRACE) 0.1 MG/GM vaginal cream Place 0.5g nightly for two weeks then twice a week after 30 g 11   metFORMIN (GLUCOPHAGE-XR) 500 MG 24 hr tablet TAKE 2 TABLETS (1,000 MG TOTAL) BY MOUTH 2 (TWO) TIMES DAILY BEFORE A MEAL. 360 tablet 1   ONETOUCH ULTRA test strip ONE TOUCH. TEST SUGARS ONCE DAILY AS DIRECTED. E11.9 100 strip 3   polyethylene glycol powder (GLYCOLAX/MIRALAX) 17 GM/SCOOP powder Take 17 g by mouth daily. Drink 17g (1 scoop) dissolved in water per day. 255 g 0   telmisartan (MICARDIS) 40 MG tablet TAKE 1 TABLET BY MOUTH EVERY DAY 90 tablet 1   VASCEPA 1 g capsule TAKE 2 CAPSULES BY MOUTH TWICE A DAY 120 capsule 5   No current facility-administered medications on file prior to visit.    Review of Systems     Objective:  There were no vitals filed for this visit. BP Readings from Last 3 Encounters:  04/29/23 114/80  10/29/22 114/78  06/23/22 133/76   Wt Readings from Last 3 Encounters:  04/29/23 136 lb (61.7 kg)  10/29/22 140 lb (63.5 kg)  06/23/22  142 lb 4.8 oz (64.5 kg)   There is no height or weight on file to calculate BMI.    Physical Exam Constitutional:      General: She is not in acute distress.    Appearance: Normal appearance. She is not ill-appearing.  HENT:     Head: Normocephalic and atraumatic.     Right Ear: Tympanic membrane, ear canal and external ear normal.     Left Ear: Tympanic membrane, ear canal and external ear normal.     Mouth/Throat:     Mouth: Mucous membranes are moist.     Pharynx: No oropharyngeal exudate or posterior oropharyngeal erythema.  Eyes:     Conjunctiva/sclera: Conjunctivae normal.  Cardiovascular:     Rate and Rhythm: Normal rate and regular rhythm.  Pulmonary:     Effort: Pulmonary effort is normal. No respiratory distress.     Breath sounds: Normal breath sounds. No wheezing or rales.  Musculoskeletal:     Cervical back: Neck supple. No tenderness.  Lymphadenopathy:     Cervical: No cervical adenopathy.  Skin:    General: Skin is warm and dry.  Neurological:     Mental Status: She is alert.            Assessment &  Plan:    See Problem List for Assessment and Plan of chronic medical problems.

## 2023-08-11 NOTE — Patient Instructions (Addendum)
         Medications changes include :   omeprazole for reflux         Omnicef - antibiotic         Tussionex - cough syrup         Restart farxiga 10 mg daily for sugars    Return if symptoms worsen or fail to improve, for routine follow up in November.

## 2023-08-12 ENCOUNTER — Ambulatory Visit: Payer: BC Managed Care – PPO | Admitting: Internal Medicine

## 2023-08-12 ENCOUNTER — Encounter: Payer: Self-pay | Admitting: Internal Medicine

## 2023-08-12 ENCOUNTER — Other Ambulatory Visit: Payer: Self-pay | Admitting: Internal Medicine

## 2023-08-12 VITALS — BP 124/82 | HR 87 | Ht 62.0 in | Wt 141.2 lb

## 2023-08-12 DIAGNOSIS — E1165 Type 2 diabetes mellitus with hyperglycemia: Secondary | ICD-10-CM

## 2023-08-12 DIAGNOSIS — R059 Cough, unspecified: Secondary | ICD-10-CM | POA: Diagnosis not present

## 2023-08-12 DIAGNOSIS — J069 Acute upper respiratory infection, unspecified: Secondary | ICD-10-CM | POA: Diagnosis not present

## 2023-08-12 DIAGNOSIS — K219 Gastro-esophageal reflux disease without esophagitis: Secondary | ICD-10-CM

## 2023-08-12 DIAGNOSIS — R0981 Nasal congestion: Secondary | ICD-10-CM

## 2023-08-12 DIAGNOSIS — Z7984 Long term (current) use of oral hypoglycemic drugs: Secondary | ICD-10-CM

## 2023-08-12 LAB — POCT INFLUENZA A/B
Influenza A, POC: NEGATIVE
Influenza B, POC: NEGATIVE

## 2023-08-12 LAB — POC COVID19 BINAXNOW: SARS Coronavirus 2 Ag: NEGATIVE

## 2023-08-12 MED ORDER — OMEPRAZOLE 40 MG PO CPDR: 40.0000 mg | DELAYED_RELEASE_CAPSULE | Freq: Every day | ORAL | 1 refills | Status: DC

## 2023-08-12 MED ORDER — ICOSAPENT ETHYL 1 G PO CAPS: 1.0000 g | ORAL_CAPSULE | Freq: Every day | ORAL | 5 refills | Status: DC

## 2023-08-12 MED ORDER — CEFDINIR 300 MG PO CAPS: 300.0000 mg | ORAL_CAPSULE | Freq: Two times a day (BID) | ORAL | 0 refills | Status: DC

## 2023-08-12 MED ORDER — HYDROCOD POLI-CHLORPHE POLI ER 10-8 MG/5ML PO SUER: 5.0000 mL | Freq: Two times a day (BID) | ORAL | 0 refills | Status: DC | PRN

## 2023-08-12 MED ORDER — DAPAGLIFLOZIN PROPANEDIOL 10 MG PO TABS: 10.0000 mg | ORAL_TABLET | Freq: Every day | ORAL | 3 refills | Status: DC

## 2023-08-12 NOTE — Assessment & Plan Note (Addendum)
Acute COVID and flu tests negative Ongoing for 2.5 weeks  - not improving Likely bacterial  Start Omnicef 300 mg BID x 10 day Tussionex cough syrup otc cold medications Rest, fluid Call if no improvement

## 2023-08-12 NOTE — Assessment & Plan Note (Signed)
Chronic Currently taking metformin-no longer taking Farxiga-states she was not able to get a refill-refilled today since her last A1c was not well-controlled-advise she needs to stay on this until her next appointment

## 2023-08-12 NOTE — Assessment & Plan Note (Signed)
Chronic Not currently on any medication States that she thinks she is having some reflux and is contributing to her chronic cough and would like to start medication again Start Meprazole 40 mg daily

## 2023-08-21 ENCOUNTER — Other Ambulatory Visit: Payer: Self-pay | Admitting: Internal Medicine

## 2023-08-27 DIAGNOSIS — K219 Gastro-esophageal reflux disease without esophagitis: Secondary | ICD-10-CM | POA: Diagnosis not present

## 2023-08-27 DIAGNOSIS — K120 Recurrent oral aphthae: Secondary | ICD-10-CM | POA: Diagnosis not present

## 2023-08-27 DIAGNOSIS — K429 Umbilical hernia without obstruction or gangrene: Secondary | ICD-10-CM | POA: Diagnosis not present

## 2023-09-06 ENCOUNTER — Other Ambulatory Visit: Payer: Self-pay | Admitting: Internal Medicine

## 2023-09-30 ENCOUNTER — Other Ambulatory Visit: Payer: Self-pay | Admitting: Internal Medicine

## 2023-10-13 ENCOUNTER — Encounter: Payer: Self-pay | Admitting: Internal Medicine

## 2023-10-13 NOTE — Progress Notes (Unsigned)
Subjective:    Patient ID: Savannah Rogers, female    DOB: [birth year], 57 y.o.   MRN: [id number]     HPI [name] is here for follow up of her chronic medical problems.  She is exercising regularly.      Medications and allergies reviewed with patient and updated if appropriate.  Current Outpatient Medications on File Prior to Visit  Medication Sig Dispense Refill   acetaminophen (TYLENOL) 500 MG tablet Take 1 tablet (500 mg total) by mouth every 6 (six) hours as needed (pain). 30 tablet 0   allopurinol (ZYLOPRIM) 100 MG tablet TAKE 1 TABLET BY MOUTH DAILY. ANNUAL APPT DUE IN NOV MUST SEE PROVIDER FOR FUTURE REFILLS 90 tablet 0   atorvastatin (LIPITOR) 20 MG tablet TAKE 1 TABLET BY MOUTH EVERY DAY 90 tablet 1   empagliflozin (JARDIANCE) 10 MG TABS tablet Take 1 tablet (10 mg total) by mouth daily before breakfast. 30 tablet 5   estradiol (ESTRACE) 0.1 MG/GM vaginal cream Place 0.5g nightly for two weeks then twice a week after 30 g 11   icosapent Ethyl (VASCEPA) 1 g capsule Take 1 capsule (1 g total) by mouth daily. 90 capsule 5   metFORMIN (GLUCOPHAGE-XR) 500 MG 24 hr tablet TAKE 2 TABLETS (1,000 MG TOTAL) BY MOUTH 2 (TWO) TIMES DAILY BEFORE A MEAL. 360 tablet 1   omeprazole (PRILOSEC) 40 MG capsule Take 1 capsule (40 mg total) by mouth daily. 90 capsule 1   ONETOUCH ULTRA test strip ONE TOUCH. TEST SUGARS ONCE DAILY AS DIRECTED. E11.9 100 strip 3   polyethylene glycol powder (GLYCOLAX/MIRALAX) 17 GM/SCOOP powder Take 17 g by mouth daily. Drink 17g (1 scoop) dissolved in water per day. 255 g 0   telmisartan (MICARDIS) 40 MG tablet TAKE 1 TABLET BY MOUTH EVERY DAY 90 tablet 1   No current facility-administered medications on file prior to visit.     Review of Systems  Constitutional:  Negative for fever.  Respiratory:  Negative for cough, shortness of breath and wheezing.   Cardiovascular:  Negative for chest pain, palpitations and leg swelling.  Neurological:  Negative for  light-headedness and headaches.       Objective:   Vitals:   10/14/23 0804  BP: 108/76  Pulse: 78  Temp: 98 F (36.7 C)  SpO2: 98%   BP Readings from Last 3 Encounters:  10/14/23 108/76  08/12/23 124/82  04/29/23 114/80   Wt Readings from Last 3 Encounters:  10/14/23 136 lb (61.7 kg)  08/12/23 141 lb 3.2 oz (64 kg)  04/29/23 136 lb (61.7 kg)   Body mass index is 24.87 kg/m.    Physical Exam Constitutional:      General: She is not in acute distress.    Appearance: Normal appearance.  HENT:     Head: Normocephalic and atraumatic.  Eyes:     Conjunctiva/sclera: Conjunctivae normal.  Cardiovascular:     Rate and Rhythm: Normal rate and regular rhythm.     Heart sounds: Normal heart sounds.  Pulmonary:     Effort: Pulmonary effort is normal. No respiratory distress.     Breath sounds: Normal breath sounds. No wheezing.  Musculoskeletal:     Cervical back: Neck supple.     Right lower leg: No edema.     Left lower leg: No edema.  Lymphadenopathy:     Cervical: No cervical adenopathy.  Skin:    General: Skin is warm and dry.     Findings: No rash.  Neurological:     Mental Status: She is alert. Mental status is at baseline.  Psychiatric:        Mood and Affect: Mood normal.        Behavior: Behavior normal.       Diabetic Foot Exam - Simple   Simple Foot Form Diabetic Foot exam was performed with the following findings: Yes 10/14/2023  8:30 AM  Visual Inspection No deformities, no ulcerations, no other skin breakdown bilaterally: Yes Sensation Testing Intact to touch and monofilament testing bilaterally: Yes Pulse Check Posterior Tibialis and Dorsalis pulse intact bilaterally: Yes Comments      Lab Results  Component Value Date   WBC 10.5 04/29/2023   HGB 14.3 04/29/2023   HCT 43.0 04/29/2023   PLT 344.0 04/29/2023   GLUCOSE 118 (H) 04/29/2023   CHOL 127 04/29/2023   TRIG 215.0 (H) 04/29/2023   HDL 40.40 04/29/2023   LDLDIRECT 64.0  04/29/2023   LDLCALC 87 08/28/2016   ALT 26 04/29/2023   AST 21 04/29/2023   NA 138 04/29/2023   K 4.1 04/29/2023   CL 101 04/29/2023   CREATININE 0.69 04/29/2023   BUN 13 04/29/2023   CO2 28 04/29/2023   TSH 2.29 10/29/2022   HGBA1C 7.8 (H) 04/29/2023   MICROALBUR <0.7 10/29/2022     Assessment & Plan:    See Problem List for Assessment and Plan of chronic medical problems.

## 2023-10-13 NOTE — Patient Instructions (Addendum)
      Blood work was ordered.   The lab is on the first floor.    Medications changes include :   none      Return in about 6 months (around 04/12/2024) for Physical Exam.

## 2023-10-14 ENCOUNTER — Ambulatory Visit: Payer: BC Managed Care – PPO | Admitting: Internal Medicine

## 2023-10-14 VITALS — BP 108/76 | HR 78 | Temp 98.0°F | Ht 62.0 in | Wt 136.0 lb

## 2023-10-14 DIAGNOSIS — Z7984 Long term (current) use of oral hypoglycemic drugs: Secondary | ICD-10-CM | POA: Diagnosis not present

## 2023-10-14 DIAGNOSIS — E790 Hyperuricemia without signs of inflammatory arthritis and tophaceous disease: Secondary | ICD-10-CM | POA: Diagnosis not present

## 2023-10-14 DIAGNOSIS — I10 Essential (primary) hypertension: Secondary | ICD-10-CM | POA: Diagnosis not present

## 2023-10-14 DIAGNOSIS — K219 Gastro-esophageal reflux disease without esophagitis: Secondary | ICD-10-CM

## 2023-10-14 DIAGNOSIS — E559 Vitamin D deficiency, unspecified: Secondary | ICD-10-CM | POA: Diagnosis not present

## 2023-10-14 DIAGNOSIS — E782 Mixed hyperlipidemia: Secondary | ICD-10-CM

## 2023-10-14 DIAGNOSIS — E1165 Type 2 diabetes mellitus with hyperglycemia: Secondary | ICD-10-CM

## 2023-10-14 LAB — LIPID PANEL
Cholesterol: 165 mg/dL (ref 0–200)
HDL: 41.3 mg/dL (ref >39.00)
LDL Cholesterol: 67 mg/dL (ref 0–99)
NonHDL: 123.95
Total CHOL/HDL Ratio: 4
Triglycerides: 283 mg/dL — ABNORMAL HIGH (ref 0.0–149.0)
VLDL: 56.6 mg/dL — ABNORMAL HIGH (ref 0.0–40.0)

## 2023-10-14 LAB — MICROALBUMIN / CREATININE URINE RATIO
Creatinine,U: 95.8 mg/dL
Microalb Creat Ratio: 1 mg/g (ref 0.0–30.0)
Microalb, Ur: 1 mg/dL (ref 0.0–1.9)

## 2023-10-14 LAB — COMPREHENSIVE METABOLIC PANEL
ALT: 38 U/L — ABNORMAL HIGH (ref 0–35)
AST: 30 U/L (ref 0–37)
Albumin: 4.6 g/dL (ref 3.5–5.2)
Alkaline Phosphatase: 79 U/L (ref 39–117)
BUN: 13 mg/dL (ref 6–23)
CO2: 28 meq/L (ref 19–32)
Calcium: 10 mg/dL (ref 8.4–10.5)
Chloride: 99 meq/L (ref 96–112)
Creatinine, Ser: 0.77 mg/dL (ref 0.40–1.20)
GFR: 84.81 mL/min (ref >60.00)
Glucose, Bld: 118 mg/dL — ABNORMAL HIGH (ref 70–99)
Potassium: 3.9 meq/L (ref 3.5–5.1)
Sodium: 137 meq/L (ref 135–145)
Total Bilirubin: 0.7 mg/dL (ref 0.2–1.2)
Total Protein: 7.8 g/dL (ref 6.0–8.3)

## 2023-10-14 LAB — HEMOGLOBIN A1C: Hgb A1c MFr Bld: 7.2 % — ABNORMAL HIGH (ref 4.6–6.5)

## 2023-10-14 LAB — VITAMIN D 25 HYDROXY (VIT D DEFICIENCY, FRACTURES): VITD: 48.25 ng/mL (ref 30.00–100.00)

## 2023-10-14 LAB — URIC ACID: Uric Acid, Serum: 5.7 mg/dL (ref 2.4–7.0)

## 2023-10-14 NOTE — Assessment & Plan Note (Addendum)
Chronic Uric acid level controlled  Continue allopurinol 100 mg daily Ck uric acid level

## 2023-10-14 NOTE — Assessment & Plan Note (Signed)
Chronic Regular exercise and healthy diet encouraged Check lipid panel  Continue atorvastatin 20 mg daily, Vascepa 2 g twice daily

## 2023-10-14 NOTE — Assessment & Plan Note (Signed)
Chronic  Lab Results  Component Value Date   HGBA1C 7.8 (H) 04/29/2023   Sugars not ideally controlled Check A1c, urine albumin/Cr  today Continue Jardiance 10 mg daily, metformin XR 1000 mg twice daily Stressed regular exercise, diabetic diet

## 2023-10-14 NOTE — Assessment & Plan Note (Addendum)
Chronic Presents with cough Controlled Continue omeprazole 40 mg daily

## 2023-10-14 NOTE — Assessment & Plan Note (Signed)
Chronic Taking vitamin D daily Check vitamin D level  

## 2023-10-14 NOTE — Assessment & Plan Note (Signed)
Chronic Blood pressure well controlled CMP Continue telmisartan 40 mg daily 

## 2023-10-22 ENCOUNTER — Telehealth: Payer: Self-pay

## 2023-10-22 ENCOUNTER — Other Ambulatory Visit (HOSPITAL_COMMUNITY): Payer: Self-pay

## 2023-10-22 NOTE — Telephone Encounter (Signed)
Pharmacy Patient Advocate Encounter   Received notification from CoverMyMeds that prior authorization for VASCEPA is required/requested.   Insurance verification completed.   The patient is insured through [facility] .   Per test claim: PA required; PA submitted to above mentioned insurance via CoverMyMeds Key/confirmation #/EOC Key: [id number] Status is pending

## 2023-10-23 ENCOUNTER — Other Ambulatory Visit (HOSPITAL_COMMUNITY): Payer: Self-pay

## 2023-10-23 NOTE — Telephone Encounter (Signed)
Pharmacy Patient Advocate Encounter  Insurance verification completed.    The patient is insured through [facility]   Ran test claim for VASCEPA. Currently a quantity of 120 is a 30 day supply and the co-pay is 25.00 ran with A [id number]-[id number] .No further P/A required   This test claim was processed through [facility]- copay amounts may vary at other pharmacies due to pharmacy/plan contracts, or as the patient moves through the different stages of their insurance plan.

## 2023-11-11 ENCOUNTER — Other Ambulatory Visit: Payer: Self-pay | Admitting: Internal Medicine

## 2023-11-22 ENCOUNTER — Other Ambulatory Visit: Payer: Self-pay | Admitting: Internal Medicine

## 2023-12-29 ENCOUNTER — Other Ambulatory Visit: Payer: Self-pay | Admitting: Internal Medicine

## 2023-12-29 DIAGNOSIS — Z1231 Encounter for screening mammogram for malignant neoplasm of breast: Secondary | ICD-10-CM

## 2024-01-09 DIAGNOSIS — R52 Pain, unspecified: Secondary | ICD-10-CM | POA: Diagnosis not present

## 2024-01-09 DIAGNOSIS — R051 Acute cough: Secondary | ICD-10-CM | POA: Diagnosis not present

## 2024-01-09 DIAGNOSIS — J029 Acute pharyngitis, unspecified: Secondary | ICD-10-CM | POA: Diagnosis not present

## 2024-01-09 DIAGNOSIS — J101 Influenza due to other identified influenza virus with other respiratory manifestations: Secondary | ICD-10-CM | POA: Diagnosis not present

## 2024-01-12 ENCOUNTER — Ambulatory Visit: Payer: BC Managed Care – PPO

## 2024-01-27 ENCOUNTER — Ambulatory Visit: Payer: BC Managed Care – PPO

## 2024-02-03 ENCOUNTER — Ambulatory Visit
Admission: RE | Admit: 2024-02-03 | Discharge: 2024-02-03 | Disposition: A | Discharge status: Home / Self Care | Payer: BC Managed Care – PPO | Source: Ambulatory Visit | Attending: Internal Medicine

## 2024-02-03 DIAGNOSIS — Z1231 Encounter for screening mammogram for malignant neoplasm of breast: Secondary | ICD-10-CM

## 2024-02-29 ENCOUNTER — Other Ambulatory Visit: Payer: Self-pay | Admitting: Internal Medicine

## 2024-03-28 ENCOUNTER — Other Ambulatory Visit: Payer: Self-pay | Admitting: Internal Medicine

## 2024-04-12 ENCOUNTER — Encounter: Payer: Self-pay | Admitting: Internal Medicine

## 2024-04-12 NOTE — Patient Instructions (Addendum)
 Blood work was ordered.       Medications changes include :   triamcinolone steroid cream for rash twice daily as needed - only use for a few days at a time     Return in about 6 months (around 10/14/2024) for follow up.   Health Maintenance, Female Adopting a healthy lifestyle and getting preventive care are important in promoting health and wellness. Ask your health care provider about: The right schedule for you to have regular tests and exams. Things you can do on your own to prevent diseases and keep yourself healthy. What should I know about diet, weight, and exercise? Eat a healthy diet  Eat a diet that includes plenty of vegetables, fruits, low-fat dairy products, and lean protein. Do not eat a lot of foods that are high in solid fats, added sugars, or sodium. Maintain a healthy weight Body mass index (BMI) is used to identify weight problems. It estimates body fat based on height and weight. Your health care provider can help determine your BMI and help you achieve or maintain a healthy weight. Get regular exercise Get regular exercise. This is one of the most important things you can do for your health. Most adults should: Exercise for at least 150 minutes each week. The exercise should increase your heart rate and make you sweat (moderate-intensity exercise). Do strengthening exercises at least twice a week. This is in addition to the moderate-intensity exercise. Spend less time sitting. Even light physical activity can be beneficial. Watch cholesterol and blood lipids Have your blood tested for lipids and cholesterol at 59 years of age, then have this test every 5 years. Have your cholesterol levels checked more often if: Your lipid or cholesterol levels are high. You are older than 59 years of age. You are at high risk for heart disease. What should I know about cancer screening? Depending on your health history and family history, you may need to have cancer  screening at various ages. This may include screening for: Breast cancer. Cervical cancer. Colorectal cancer. Skin cancer. Lung cancer. What should I know about heart disease, diabetes, and high blood pressure? Blood pressure and heart disease High blood pressure causes heart disease and increases the risk of stroke. This is more likely to develop in people who have high blood pressure readings or are overweight. Have your blood pressure checked: Every 3-5 years if you are 13-55 years of age. Every year if you are 70 years old or older. Diabetes Have regular diabetes screenings. This checks your fasting blood sugar level. Have the screening done: Once every three years after age 40 if you are at a normal weight and have a low risk for diabetes. More often and at a younger age if you are overweight or have a high risk for diabetes. What should I know about preventing infection? Hepatitis B If you have a higher risk for hepatitis B, you should be screened for this virus. Talk with your health care provider to find out if you are at risk for hepatitis B infection. Hepatitis C Testing is recommended for: Everyone born from [birth year] through 1966. Anyone with known risk factors for hepatitis C. Sexually transmitted infections (STIs) Get screened for STIs, including gonorrhea and chlamydia, if: You are sexually active and are younger than 59 years of age. You are older than 59 years of age and your health care provider tells you that you are at risk for this type of infection. Your sexual  activity has changed since you were last screened, and you are at increased risk for chlamydia or gonorrhea. Ask your health care provider if you are at risk. Ask your health care provider about whether you are at high risk for HIV. Your health care provider may recommend a prescription medicine to help prevent HIV infection. If you choose to take medicine to prevent HIV, you should first get tested for HIV. You  should then be tested every 3 months for as long as you are taking the medicine. Pregnancy If you are about to stop having your period (premenopausal) and you may become pregnant, seek counseling before you get pregnant. Take 400 to 800 micrograms (mcg) of folic acid every day if you become pregnant. Ask for birth control (contraception) if you want to prevent pregnancy. Osteoporosis and menopause Osteoporosis is a disease in which the bones lose minerals and strength with aging. This can result in bone fractures. If you are 74 years old or older, or if you are at risk for osteoporosis and fractures, ask your health care provider if you should: Be screened for bone loss. Take a calcium  or vitamin D  supplement to lower your risk of fractures. Be given hormone replacement therapy (HRT) to treat symptoms of menopause. Follow these instructions at home: Alcohol use Do not drink alcohol if: Your health care provider tells you not to drink. You are pregnant, may be pregnant, or are planning to become pregnant. If you drink alcohol: Limit how much you have to: 0-1 drink a day. Know how much alcohol is in your drink. In the [place] one drink equals one 12 oz bottle of beer (355 mL), one 5 oz glass of wine (148 mL), or one 1 oz glass of hard liquor (44 mL). Lifestyle Do not use any products that contain nicotine or tobacco. These products include cigarettes, chewing tobacco, and vaping devices, such as e-cigarettes. If you need help quitting, ask your health care provider. Do not use street drugs. Do not share needles. Ask your health care provider for help if you need support or information about quitting drugs. General instructions Schedule regular health, dental, and eye exams. Stay current with your vaccines. Tell your health care provider if: You often feel depressed. You have ever been abused or do not feel safe at home. Summary Adopting a healthy lifestyle and getting preventive care are  important in promoting health and wellness. Follow your health care provider's instructions about healthy diet, exercising, and getting tested or screened for diseases. Follow your health care provider's instructions on monitoring your cholesterol and blood pressure. This information is not intended to replace advice given to you by your health care provider. Make sure you discuss any questions you have with your health care provider. Document Revised: 04/08/2021 Document Reviewed: 04/08/2021 Elsevier Patient Education  2024 [employer].

## 2024-04-12 NOTE — Progress Notes (Unsigned)
 Subjective:    Patient ID: Savannah Rogers, female    DOB: 09-02-1966, 58 y.o.   MRN: [id number]      HPI Savannah Rogers is here for a Physical exam and her chronic medical problems.    Itching b/l lateral aspects of face and neck x 1 week - intermittent.  No rash, but feels a little bumps.  Maybe new laundry detergent.      Also has itching between fingers b/l.  No hx eczema.     Sugars 112-129 at home   Medications and allergies reviewed with patient and updated if appropriate.  Current Outpatient Medications on File Prior to Visit  Medication Sig Dispense Refill   acetaminophen  (TYLENOL ) 500 MG tablet Take 1 tablet (500 mg total) by mouth every 6 (six) hours as needed (pain). 30 tablet 0   allopurinol  (ZYLOPRIM ) 100 MG tablet TAKE 1 TABLET BY MOUTH DAILY. ANNUAL APPT DUE IN NOV MUST SEE PROVIDER FOR FUTURE REFILLS 90 tablet 0   atorvastatin  (LIPITOR) 20 MG tablet TAKE 1 TABLET BY MOUTH EVERY DAY 90 tablet 1   estradiol  (ESTRACE ) 0.1 MG/GM vaginal cream Place 0.5g nightly for two weeks then twice a week after 30 g 11   icosapent  Ethyl (VASCEPA ) 1 g capsule Take 1 capsule (1 g total) by mouth daily. 90 capsule 5   JARDIANCE 10 MG TABS tablet TAKE 1 TABLET BY MOUTH DAILY BEFORE BREAKFAST. 90 tablet 1   metFORMIN  (GLUCOPHAGE -XR) 500 MG 24 hr tablet TAKE 2 TABLETS (1,000 MG TOTAL) BY MOUTH 2 (TWO) TIMES DAILY BEFORE A MEAL. 360 tablet 1   omeprazole  (PRILOSEC) 40 MG capsule Take 1 capsule (40 mg total) by mouth daily. 90 capsule 1   ONETOUCH ULTRA test strip ONE TOUCH. TEST SUGARS ONCE DAILY AS DIRECTED. E11.9 100 strip 3   polyethylene glycol powder (GLYCOLAX /MIRALAX ) 17 GM/SCOOP powder Take 17 g by mouth daily. Drink 17g (1 scoop) dissolved in water per day. 255 g 0   telmisartan  (MICARDIS ) 40 MG tablet TAKE 1 TABLET BY MOUTH EVERY DAY 90 tablet 1   No current facility-administered medications on file prior to visit.    Review of Systems  Constitutional:  Negative for fever.  Eyes:   Negative for visual disturbance.  Respiratory:  Negative for cough, shortness of breath and wheezing.   Cardiovascular:  Negative for chest pain, palpitations and leg swelling.  Gastrointestinal:  Negative for abdominal pain, blood in stool, constipation and diarrhea.       No gerd  Genitourinary:  Negative for dysuria.  Musculoskeletal:  Negative for arthralgias and back pain.  Skin:  Positive for rash.  Neurological:  Negative for light-headedness and headaches.  Psychiatric/Behavioral:  Negative for dysphoric mood. The patient is not nervous/anxious.        Objective:   Vitals:   04/13/24 0817  BP: 112/60  Pulse: 75  Temp: 97.9 F (36.6 C)  SpO2: 99%   Filed Weights   04/13/24 0817  Weight: 137 lb (62.1 kg)   Body mass index is 25.06 kg/m.  BP Readings from Last 3 Encounters:  04/13/24 112/60  10/14/23 108/76  08/12/23 124/82    Wt Readings from Last 3 Encounters:  04/13/24 137 lb (62.1 kg)  10/14/23 136 lb (61.7 kg)  08/12/23 141 lb 3.2 oz (64 kg)       Physical Exam Constitutional: She appears well-developed and well-nourished. No distress.  HENT:  Head: Normocephalic and atraumatic.  Right Ear: External ear normal. Normal ear canal  and TM Left Ear: External ear normal.  Normal ear canal and TM Mouth/Throat: Oropharynx is clear and moist.  Eyes: Conjunctivae normal.  Neck: Neck supple. No tracheal deviation present. No thyromegaly present.  No carotid bruit  Cardiovascular: Normal rate, regular rhythm and normal heart sounds.   No murmur heard.  No edema. Pulmonary/Chest: Effort normal and breath sounds normal. No respiratory distress. She has no wheezes. She has no rales.  Breast: deferred   Abdominal: Soft. She exhibits no distension. There is no tenderness.  Lymphadenopathy: She has no cervical adenopathy.  Skin: Skin is warm and dry. She is not diaphoretic.  Psychiatric: She has a normal mood and affect. Her behavior is normal.     Lab  Results  Component Value Date   WBC 10.5 04/29/2023   HGB 14.3 04/29/2023   HCT 43.0 04/29/2023   PLT 344.0 04/29/2023   GLUCOSE 118 (H) 10/14/2023   CHOL 165 10/14/2023   TRIG 283.0 (H) 10/14/2023   HDL 41.30 10/14/2023   LDLDIRECT 64.0 04/29/2023   LDLCALC 67 10/14/2023   ALT 38 (H) 10/14/2023   AST 30 10/14/2023   NA 137 10/14/2023   K 3.9 10/14/2023   CL 99 10/14/2023   CREATININE 0.77 10/14/2023   BUN 13 10/14/2023   CO2 28 10/14/2023   TSH 2.29 10/29/2022   HGBA1C 7.2 (H) 10/14/2023   MICROALBUR 1.0 10/14/2023         Assessment & Plan:   Physical exam: Screening blood work  ordered Exercise  regular Weight  normal Substance abuse  none   Reviewed recommended immunizations.   Health Maintenance  Topic Date Due   Zoster Vaccines- Shingrix (1 of 2) Never done   HEMOGLOBIN A1C  04/12/2024   COVID-19 Vaccine (3 - 2024-25 season) 04/28/2024 (Originally 08/02/2023)   OPHTHALMOLOGY EXAM  04/27/2024   INFLUENZA VACCINE  07/01/2024   Diabetic kidney evaluation - eGFR measurement  10/13/2024   Diabetic kidney evaluation - Urine ACR  10/13/2024   FOOT EXAM  10/13/2024   Colonoscopy  08/09/2025   MAMMOGRAM  02/02/2026   Pneumococcal Vaccine 53-53 Years old (3 of 3 - PCV20 or PCV21) 09/23/2026   DTaP/Tdap/Td (3 - Td or Tdap) 09/10/2027   Hepatitis C Screening  Completed   HIV Screening  Completed   HPV VACCINES  Aged Out   Meningococcal B Vaccine  Aged Out          See Problem List for Assessment and Plan of chronic medical problems.

## 2024-04-13 ENCOUNTER — Ambulatory Visit (INDEPENDENT_AMBULATORY_CARE_PROVIDER_SITE_OTHER): Payer: BC Managed Care – PPO | Admitting: Internal Medicine

## 2024-04-13 VITALS — BP 112/60 | HR 75 | Temp 97.9°F | Ht 62.0 in | Wt 137.0 lb

## 2024-04-13 DIAGNOSIS — R21 Rash and other nonspecific skin eruption: Secondary | ICD-10-CM | POA: Insufficient documentation

## 2024-04-13 DIAGNOSIS — Z0000 Encounter for general adult medical examination without abnormal findings: Secondary | ICD-10-CM

## 2024-04-13 DIAGNOSIS — E1165 Type 2 diabetes mellitus with hyperglycemia: Secondary | ICD-10-CM | POA: Diagnosis not present

## 2024-04-13 DIAGNOSIS — E790 Hyperuricemia without signs of inflammatory arthritis and tophaceous disease: Secondary | ICD-10-CM

## 2024-04-13 DIAGNOSIS — Z0001 Encounter for general adult medical examination with abnormal findings: Secondary | ICD-10-CM | POA: Diagnosis not present

## 2024-04-13 DIAGNOSIS — K219 Gastro-esophageal reflux disease without esophagitis: Secondary | ICD-10-CM

## 2024-04-13 DIAGNOSIS — E559 Vitamin D deficiency, unspecified: Secondary | ICD-10-CM

## 2024-04-13 DIAGNOSIS — E782 Mixed hyperlipidemia: Secondary | ICD-10-CM | POA: Diagnosis not present

## 2024-04-13 DIAGNOSIS — I10 Essential (primary) hypertension: Secondary | ICD-10-CM | POA: Diagnosis not present

## 2024-04-13 LAB — HEMOGLOBIN A1C: Hgb A1c MFr Bld: 7.5 % — ABNORMAL HIGH (ref 4.6–6.5)

## 2024-04-13 LAB — CBC WITH DIFFERENTIAL/PLATELET
Basophils Absolute: 0.1 10*3/uL (ref 0.0–0.1)
Basophils Relative: 1.1 % (ref 0.0–3.0)
Eosinophils Absolute: 0.2 10*3/uL (ref 0.0–0.7)
Eosinophils Relative: 1.9 % (ref 0.0–5.0)
HCT: 42.1 % (ref 36.0–46.0)
Hemoglobin: 14 g/dL (ref 12.0–15.0)
Lymphocytes Relative: 34.2 % (ref 12.0–46.0)
Lymphs Abs: 3.4 10*3/uL (ref 0.7–4.0)
MCHC: 33.2 g/dL (ref 30.0–36.0)
MCV: 87 fl (ref 78.0–100.0)
Monocytes Absolute: 0.4 10*3/uL (ref 0.1–1.0)
Monocytes Relative: 4.3 % (ref 3.0–12.0)
Neutro Abs: 5.8 10*3/uL (ref 1.4–7.7)
Neutrophils Relative %: 58.5 % (ref 43.0–77.0)
Platelets: 387 10*3/uL (ref 150.0–400.0)
RBC: 4.84 Mil/uL (ref 3.87–5.11)
RDW: 13.9 % (ref 11.5–15.5)
WBC: 9.9 10*3/uL (ref 4.0–10.5)

## 2024-04-13 LAB — LIPID PANEL
Cholesterol: 163 mg/dL (ref 0–200)
HDL: 43.4 mg/dL (ref >39.00)
LDL Cholesterol: 72 mg/dL (ref 0–99)
NonHDL: 120.06
Total CHOL/HDL Ratio: 4
Triglycerides: 242 mg/dL — ABNORMAL HIGH (ref 0.0–149.0)
VLDL: 48.4 mg/dL — ABNORMAL HIGH (ref 0.0–40.0)

## 2024-04-13 LAB — VITAMIN D 25 HYDROXY (VIT D DEFICIENCY, FRACTURES): VITD: 39.45 ng/mL (ref 30.00–100.00)

## 2024-04-13 LAB — COMPREHENSIVE METABOLIC PANEL WITH GFR
ALT: 25 U/L (ref 0–35)
AST: 22 U/L (ref 0–37)
Albumin: 4.5 g/dL (ref 3.5–5.2)
Alkaline Phosphatase: 75 U/L (ref 39–117)
BUN: 14 mg/dL (ref 6–23)
CO2: 28 meq/L (ref 19–32)
Calcium: 9.8 mg/dL (ref 8.4–10.5)
Chloride: 100 meq/L (ref 96–112)
Creatinine, Ser: 0.72 mg/dL (ref 0.40–1.20)
GFR: 91.61 mL/min (ref >60.00)
Glucose, Bld: 121 mg/dL — ABNORMAL HIGH (ref 70–99)
Potassium: 4.3 meq/L (ref 3.5–5.1)
Sodium: 138 meq/L (ref 135–145)
Total Bilirubin: 0.4 mg/dL (ref 0.2–1.2)
Total Protein: 7.6 g/dL (ref 6.0–8.3)

## 2024-04-13 LAB — TSH: TSH: 2.03 u[IU]/mL (ref 0.35–5.50)

## 2024-04-13 LAB — URIC ACID: Uric Acid, Serum: 5 mg/dL (ref 2.4–7.0)

## 2024-04-13 MED ORDER — ATORVASTATIN CALCIUM 20 MG PO TABS: 20.0000 mg | ORAL_TABLET | Freq: Every day | ORAL | 1 refills | Status: DC

## 2024-04-13 MED ORDER — OMEPRAZOLE 40 MG PO CPDR: 40.0000 mg | DELAYED_RELEASE_CAPSULE | Freq: Every day | ORAL | 1 refills | Status: AC

## 2024-04-13 MED ORDER — TRIAMCINOLONE ACETONIDE 0.1 % EX CREA
1.0000 | TOPICAL_CREAM | Freq: Two times a day (BID) | CUTANEOUS | 0 refills | Status: AC
Start: 2024-04-13 — End: ?

## 2024-04-13 MED ORDER — EMPAGLIFLOZIN 10 MG PO TABS: 10.0000 mg | ORAL_TABLET | Freq: Every day | ORAL | 1 refills | Status: DC

## 2024-04-13 MED ORDER — ALLOPURINOL 100 MG PO TABS: ORAL_TABLET | ORAL | 2 refills | Status: AC

## 2024-04-13 NOTE — Assessment & Plan Note (Signed)
 Chronic Regular exercise and healthy diet encouraged Check lipid panel, CMP, TSH Continue atorvastatin  20 mg daily, Vascepa  2 g twice daily

## 2024-04-13 NOTE — Assessment & Plan Note (Signed)
 Chronic Uric acid level controlled  Continue allopurinol 100 mg daily Ck uric acid level

## 2024-04-13 NOTE — Assessment & Plan Note (Signed)
 Chronic  Lab Results  Component Value Date   HGBA1C 7.2 (H) 10/14/2023   Sugars not ideally controlled Check A1c Continue Jardiance 10 mg daily, metformin  XR 1000 mg twice daily If A1c not at goal discussed increasing Jardiance to 25 mg daily Stressed regular exercise, diabetic diet

## 2024-04-13 NOTE — Assessment & Plan Note (Signed)
 Chronic Presents with cough Controlled Continue omeprazole 40 mg daily

## 2024-04-13 NOTE — Assessment & Plan Note (Signed)
 Acute Present for about 1 week On lateral aspects of face near ear and extends down into the neck Papules noted, no erythema ?  Related to new lotion or changing laundry detergent Will give triamcinolone-use twice daily times few days Savannah Rogers is mild so expect this to work quickly Call if no improvement

## 2024-04-13 NOTE — Assessment & Plan Note (Signed)
Chronic Blood pressure well controlled CMP, CBC Continue telmisartan 40 mg daily

## 2024-04-13 NOTE — Assessment & Plan Note (Signed)
 Chronic Taking vitamin D daily Check vitamin D level

## 2024-04-14 ENCOUNTER — Ambulatory Visit: Payer: Self-pay | Admitting: Internal Medicine

## 2024-04-21 DIAGNOSIS — J019 Acute sinusitis, unspecified: Secondary | ICD-10-CM | POA: Diagnosis not present

## 2024-04-21 DIAGNOSIS — R053 Chronic cough: Secondary | ICD-10-CM | POA: Diagnosis not present

## 2024-04-21 DIAGNOSIS — H938X9 Other specified disorders of ear, unspecified ear: Secondary | ICD-10-CM | POA: Diagnosis not present

## 2024-04-29 DIAGNOSIS — H2513 Age-related nuclear cataract, bilateral: Secondary | ICD-10-CM | POA: Diagnosis not present

## 2024-04-29 DIAGNOSIS — E119 Type 2 diabetes mellitus without complications: Secondary | ICD-10-CM | POA: Diagnosis not present

## 2024-05-11 DIAGNOSIS — R053 Chronic cough: Secondary | ICD-10-CM | POA: Diagnosis not present

## 2024-05-25 ENCOUNTER — Encounter: Payer: Self-pay | Admitting: Internal Medicine

## 2024-05-25 NOTE — Patient Instructions (Incomplete)
      Blood work was ordered.       Medications changes include :   None

## 2024-05-25 NOTE — Progress Notes (Unsigned)
    Subjective:    Patient ID: Savannah Rogers, female    DOB: [birth year], 58 y.o.   MRN: [id number]      HPI Galilea is here for No chief complaint on file.   Sister in law diagnosed with TB and hospitalized - would like to be tested.       Medications and allergies reviewed with patient and updated if appropriate.  Current Outpatient Medications on File Prior to Visit  Medication Sig Dispense Refill   acetaminophen  (TYLENOL ) 500 MG tablet Take 1 tablet (500 mg total) by mouth every 6 (six) hours as needed (pain). 30 tablet 0   allopurinol  (ZYLOPRIM ) 100 MG tablet TAKE 1 TABLET BY MOUTH DAILY 90 tablet 2   atorvastatin  (LIPITOR) 20 MG tablet Take 1 tablet (20 mg total) by mouth daily. 90 tablet 1   empagliflozin  (JARDIANCE ) 10 MG TABS tablet Take 1 tablet (10 mg total) by mouth daily before breakfast. 90 tablet 1   estradiol  (ESTRACE ) 0.1 MG/GM vaginal cream Place 0.5g nightly for two weeks then twice a week after 30 g 11   icosapent  Ethyl (VASCEPA ) 1 g capsule Take 1 capsule (1 g total) by mouth daily. 90 capsule 5   metFORMIN  (GLUCOPHAGE -XR) 500 MG 24 hr tablet TAKE 2 TABLETS (1,000 MG TOTAL) BY MOUTH 2 (TWO) TIMES DAILY BEFORE A MEAL. 360 tablet 1   omeprazole  (PRILOSEC) 40 MG capsule Take 1 capsule (40 mg total) by mouth daily. 90 capsule 1   ONETOUCH ULTRA test strip ONE TOUCH. TEST SUGARS ONCE DAILY AS DIRECTED. E11.9 100 strip 3   polyethylene glycol powder (GLYCOLAX /MIRALAX ) 17 GM/SCOOP powder Take 17 g by mouth daily. Drink 17g (1 scoop) dissolved in water per day. 255 g 0   telmisartan  (MICARDIS ) 40 MG tablet TAKE 1 TABLET BY MOUTH EVERY DAY 90 tablet 1   triamcinolone  cream (KENALOG ) 0.1 % Apply 1 Application topically 2 (two) times daily. 30 g 0   No current facility-administered medications on file prior to visit.    Review of Systems     Objective:  There were no vitals filed for this visit. BP Readings from Last 3 Encounters:  04/13/24 112/60  10/14/23 108/76   08/12/23 124/82   Wt Readings from Last 3 Encounters:  04/13/24 137 lb (62.1 kg)  10/14/23 136 lb (61.7 kg)  08/12/23 141 lb 3.2 oz (64 kg)   There is no height or weight on file to calculate BMI.    Physical Exam         Assessment & Plan:    See Problem List for Assessment and Plan of chronic medical problems.

## 2024-05-26 ENCOUNTER — Ambulatory Visit: Admitting: Internal Medicine

## 2024-05-26 VITALS — BP 116/64 | HR 74 | Temp 98.0°F | Ht 62.0 in | Wt 136.0 lb

## 2024-05-26 DIAGNOSIS — I10 Essential (primary) hypertension: Secondary | ICD-10-CM

## 2024-05-26 DIAGNOSIS — Z201 Contact with and (suspected) exposure to tuberculosis: Secondary | ICD-10-CM | POA: Insufficient documentation

## 2024-05-26 NOTE — Assessment & Plan Note (Signed)
 Sister-in-law dx with active TB - currently quarantined and under treatment Health dept advised for all family members to get tested Asymptomatic TB Gold blood test

## 2024-05-26 NOTE — Assessment & Plan Note (Signed)
 Chronic Blood pressure well controlled Continue telmisartan  40 mg daily

## 2024-05-30 ENCOUNTER — Other Ambulatory Visit (INDEPENDENT_AMBULATORY_CARE_PROVIDER_SITE_OTHER)

## 2024-05-30 ENCOUNTER — Other Ambulatory Visit: Payer: Self-pay | Admitting: Internal Medicine

## 2024-05-30 DIAGNOSIS — Z201 Contact with and (suspected) exposure to tuberculosis: Secondary | ICD-10-CM

## 2024-05-30 LAB — QUANTIFERON-TB GOLD PLUS

## 2024-06-01 LAB — QUANTIFERON-TB GOLD PLUS
Mitogen-NIL: >10 [IU]/mL
NIL: 0.03 [IU]/mL
QuantiFERON-TB Gold Plus: NEGATIVE
TB1-NIL: 0 [IU]/mL
TB2-NIL: 0 [IU]/mL

## 2024-06-05 ENCOUNTER — Ambulatory Visit: Payer: Self-pay | Admitting: Internal Medicine

## 2024-09-21 ENCOUNTER — Other Ambulatory Visit: Payer: Self-pay | Admitting: Internal Medicine

## 2024-09-22 ENCOUNTER — Other Ambulatory Visit: Payer: Self-pay | Admitting: Internal Medicine

## 2024-10-19 ENCOUNTER — Encounter: Payer: Self-pay | Admitting: Internal Medicine

## 2024-10-19 ENCOUNTER — Ambulatory Visit: Admitting: Internal Medicine

## 2024-10-19 VITALS — BP 110/78 | HR 73 | Temp 98.0°F | Ht 62.0 in | Wt 135.0 lb

## 2024-10-19 DIAGNOSIS — K219 Gastro-esophageal reflux disease without esophagitis: Secondary | ICD-10-CM | POA: Diagnosis not present

## 2024-10-19 DIAGNOSIS — I152 Hypertension secondary to endocrine disorders: Secondary | ICD-10-CM

## 2024-10-19 DIAGNOSIS — E790 Hyperuricemia without signs of inflammatory arthritis and tophaceous disease: Secondary | ICD-10-CM

## 2024-10-19 DIAGNOSIS — E785 Hyperlipidemia, unspecified: Secondary | ICD-10-CM

## 2024-10-19 DIAGNOSIS — E1159 Type 2 diabetes mellitus with other circulatory complications: Secondary | ICD-10-CM | POA: Diagnosis not present

## 2024-10-19 DIAGNOSIS — E1169 Type 2 diabetes mellitus with other specified complication: Secondary | ICD-10-CM

## 2024-10-19 DIAGNOSIS — E559 Vitamin D deficiency, unspecified: Secondary | ICD-10-CM

## 2024-10-19 LAB — LIPID PANEL
Cholesterol: 140 mg/dL (ref 0–200)
HDL: 43.4 mg/dL (ref >39.00)
LDL Cholesterol: 52 mg/dL (ref 0–99)
NonHDL: 96.72
Total CHOL/HDL Ratio: 3
Triglycerides: 225 mg/dL — ABNORMAL HIGH (ref 0.0–149.0)
VLDL: 45 mg/dL — ABNORMAL HIGH (ref 0.0–40.0)

## 2024-10-19 LAB — CBC WITH DIFFERENTIAL/PLATELET
Basophils Absolute: 0.1 K/uL (ref 0.0–0.1)
Basophils Relative: 1.1 % (ref 0.0–3.0)
Eosinophils Absolute: 0.2 K/uL (ref 0.0–0.7)
Eosinophils Relative: 1.6 % (ref 0.0–5.0)
HCT: 40.4 % (ref 36.0–46.0)
Hemoglobin: 13.7 g/dL (ref 12.0–15.0)
Lymphocytes Relative: 32.9 % (ref 12.0–46.0)
Lymphs Abs: 3.5 K/uL (ref 0.7–4.0)
MCHC: 33.9 g/dL (ref 30.0–36.0)
MCV: 84.1 fl (ref 78.0–100.0)
Monocytes Absolute: 0.4 K/uL (ref 0.1–1.0)
Monocytes Relative: 4 % (ref 3.0–12.0)
Neutro Abs: 6.3 K/uL (ref 1.4–7.7)
Neutrophils Relative %: 60.4 % (ref 43.0–77.0)
Platelets: 375 K/uL (ref 150.0–400.0)
RBC: 4.81 Mil/uL (ref 3.87–5.11)
RDW: 13.7 % (ref 11.5–15.5)
WBC: 10.5 K/uL (ref 4.0–10.5)

## 2024-10-19 LAB — COMPREHENSIVE METABOLIC PANEL WITH GFR
ALT: 26 U/L (ref 0–35)
AST: 21 U/L (ref 0–37)
Albumin: 4.4 g/dL (ref 3.5–5.2)
Alkaline Phosphatase: 76 U/L (ref 39–117)
BUN: 17 mg/dL (ref 6–23)
CO2: 26 meq/L (ref 19–32)
Calcium: 9.6 mg/dL (ref 8.4–10.5)
Chloride: 101 meq/L (ref 96–112)
Creatinine, Ser: 0.69 mg/dL (ref 0.40–1.20)
GFR: 94.74 mL/min (ref >60.00)
Glucose, Bld: 117 mg/dL — ABNORMAL HIGH (ref 70–99)
Potassium: 4 meq/L (ref 3.5–5.1)
Sodium: 138 meq/L (ref 135–145)
Total Bilirubin: 0.4 mg/dL (ref 0.2–1.2)
Total Protein: 7.6 g/dL (ref 6.0–8.3)

## 2024-10-19 LAB — MICROALBUMIN / CREATININE URINE RATIO
Creatinine,U: 37 mg/dL
Microalb Creat Ratio: UNDETERMINED mg/g (ref 0.0–30.0)
Microalb, Ur: <0.7 mg/dL

## 2024-10-19 LAB — HEMOGLOBIN A1C: Hgb A1c MFr Bld: 6.8 % — ABNORMAL HIGH (ref 4.6–6.5)

## 2024-10-19 LAB — URIC ACID: Uric Acid, Serum: 5 mg/dL (ref 2.4–7.0)

## 2024-10-19 LAB — VITAMIN D 25 HYDROXY (VIT D DEFICIENCY, FRACTURES): VITD: 60.8 ng/mL (ref 30.00–100.00)

## 2024-10-19 NOTE — Assessment & Plan Note (Signed)
 Chronic Taking vitamin D daily Check vitamin D level

## 2024-10-19 NOTE — Assessment & Plan Note (Addendum)
 Chronic Associated with hyperlipidemia Lab Results  Component Value Date   HGBA1C 7.5 (H) 04/13/2024   Sugars not ideally controlled Check A1c, urine albumin/cr ration Continue Jardiance  10 mg daily, metformin  XR 1000 mg twice daily Stressed regular exercise, diabetic diet

## 2024-10-19 NOTE — Assessment & Plan Note (Signed)
 Chronic Blood pressure well controlled Cmp, cbc Continue telmisartan  40 mg daily

## 2024-10-19 NOTE — Assessment & Plan Note (Signed)
 Chronic Uric acid level controlled  Continue allopurinol 100 mg daily Ck uric acid level

## 2024-10-19 NOTE — Assessment & Plan Note (Signed)
 Chronic Presents with cough Controlled Continue omeprazole 40 mg daily

## 2024-10-19 NOTE — Assessment & Plan Note (Signed)
 Chronic Regular exercise and healthy diet encouraged Check lipid panel, CMP, TSH Continue atorvastatin  20 mg daily, Vascepa  2 g twice daily

## 2024-10-19 NOTE — Patient Instructions (Addendum)
      Blood work was ordered.       Medications changes include :   None     Return in about 6 months (around 04/18/2025) for Physical Exam.

## 2024-10-19 NOTE — Progress Notes (Signed)
 Subjective:    Patient ID: Savannah Rogers, female    DOB: [birth year], 58 y.o.   MRN: [id number]     HPI Savannah Rogers is here for follow up of her chronic medical problems.  Had a nosebleed yesterday - has had one prior but not recently.    Sugar in morning is good - 117.  Has been eating well - has decreased bread.   She is exercising regularly.       Medications and allergies reviewed with patient and updated if appropriate.  Current Outpatient Medications on File Prior to Visit  Medication Sig Dispense Refill   acetaminophen  (TYLENOL ) 500 MG tablet Take 1 tablet (500 mg total) by mouth every 6 (six) hours as needed (pain). 30 tablet 0   allopurinol  (ZYLOPRIM ) 100 MG tablet TAKE 1 TABLET BY MOUTH DAILY 90 tablet 2   atorvastatin  (LIPITOR) 20 MG tablet Take 1 tablet (20 mg total) by mouth daily. 90 tablet 1   empagliflozin  (JARDIANCE ) 10 MG TABS tablet Take 1 tablet (10 mg total) by mouth daily before breakfast. 90 tablet 1   estradiol  (ESTRACE ) 0.1 MG/GM vaginal cream Place 0.5g nightly for two weeks then twice a week after 30 g 11   metFORMIN  (GLUCOPHAGE -XR) 500 MG 24 hr tablet TAKE 2 TABLETS (1,000 MG TOTAL) BY MOUTH 2 (TWO) TIMES DAILY BEFORE A MEAL. 360 tablet 1   omeprazole  (PRILOSEC) 40 MG capsule Take 1 capsule (40 mg total) by mouth daily. 90 capsule 1   ONETOUCH ULTRA test strip ONE TOUCH. TEST SUGARS ONCE DAILY AS DIRECTED. E11.9 100 strip 3   polyethylene glycol powder (GLYCOLAX /MIRALAX ) 17 GM/SCOOP powder Take 17 g by mouth daily. Drink 17g (1 scoop) dissolved in water per day. 255 g 0   telmisartan  (MICARDIS ) 40 MG tablet TAKE 1 TABLET BY MOUTH EVERY DAY 90 tablet 1   triamcinolone  cream (KENALOG ) 0.1 % Apply 1 Application topically 2 (two) times daily. 30 g 0   VASCEPA  1 g capsule TAKE 1 CAPSULE BY MOUTH DAILY. 90 capsule 5   No current facility-administered medications on file prior to visit.     Review of Systems  Constitutional:  Negative for fever.  HENT:   Positive for nosebleeds.   Respiratory:  Negative for cough, shortness of breath and wheezing.   Cardiovascular:  Negative for chest pain, palpitations and leg swelling.  Neurological:  Negative for light-headedness and headaches.       Objective:   Vitals:   10/19/24 0806  BP: 110/78  Pulse: 73  Temp: 98 F (36.7 C)  SpO2: 97%   BP Readings from Last 3 Encounters:  10/19/24 110/78  05/26/24 116/64  04/13/24 112/60   Wt Readings from Last 3 Encounters:  10/19/24 135 lb (61.2 kg)  05/26/24 136 lb (61.7 kg)  04/13/24 137 lb (62.1 kg)   Body mass index is 24.69 kg/m.    Physical Exam Constitutional:      General: She is not in acute distress.    Appearance: Normal appearance.  HENT:     Head: Normocephalic and atraumatic.  Eyes:     Conjunctiva/sclera: Conjunctivae normal.  Cardiovascular:     Rate and Rhythm: Normal rate and regular rhythm.     Heart sounds: Normal heart sounds.  Pulmonary:     Effort: Pulmonary effort is normal. No respiratory distress.     Breath sounds: Normal breath sounds. No wheezing.  Musculoskeletal:     Cervical back: Neck supple.  Right lower leg: No edema.     Left lower leg: No edema.  Lymphadenopathy:     Cervical: No cervical adenopathy.  Skin:    General: Skin is warm and dry.     Findings: No rash.  Neurological:     Mental Status: She is alert. Mental status is at baseline.  Psychiatric:        Mood and Affect: Mood normal.        Behavior: Behavior normal.       Diabetic Foot Exam - Simple   Simple Foot Form Diabetic Foot exam was performed with the following findings: Yes 10/19/2024  8:40 AM  Visual Inspection No deformities, no ulcerations, no other skin breakdown bilaterally: Yes Sensation Testing Intact to touch and monofilament testing bilaterally: Yes Pulse Check Posterior Tibialis and Dorsalis pulse intact bilaterally: Yes Comments      Lab Results  Component Value Date   WBC 9.9 04/13/2024   HGB  14.0 04/13/2024   HCT 42.1 04/13/2024   PLT 387.0 04/13/2024   GLUCOSE 121 (H) 04/13/2024   CHOL 163 04/13/2024   TRIG 242.0 (H) 04/13/2024   HDL 43.40 04/13/2024   LDLDIRECT 64.0 04/29/2023   LDLCALC 72 04/13/2024   ALT 25 04/13/2024   AST 22 04/13/2024   NA 138 04/13/2024   K 4.3 04/13/2024   CL 100 04/13/2024   CREATININE 0.72 04/13/2024   BUN 14 04/13/2024   CO2 28 04/13/2024   TSH 2.03 04/13/2024   HGBA1C 7.5 (H) 04/13/2024     Assessment & Plan:    See Problem List for Assessment and Plan of chronic medical problems.

## 2024-10-20 ENCOUNTER — Ambulatory Visit: Payer: Self-pay | Admitting: Internal Medicine

## 2024-12-19 ENCOUNTER — Other Ambulatory Visit: Payer: Self-pay | Admitting: Internal Medicine

## 2025-04-19 ENCOUNTER — Encounter: Admitting: Internal Medicine
